# Patient Record
Sex: Male | Born: 1955 | Race: White | Hispanic: No | Smoking: Former smoker
Health system: Southern US, Community
[De-identification: ages and names within clinical notes are randomized; demographics above are authoritative.]

## PROBLEM LIST (undated history)

## (undated) DIAGNOSIS — I10 Essential (primary) hypertension: Secondary | ICD-10-CM

## (undated) DIAGNOSIS — C413 Malignant neoplasm of ribs, sternum and clavicle: Secondary | ICD-10-CM

## (undated) DIAGNOSIS — J449 Chronic obstructive pulmonary disease, unspecified: Secondary | ICD-10-CM

## (undated) DIAGNOSIS — N200 Calculus of kidney: Secondary | ICD-10-CM

## (undated) DIAGNOSIS — Z720 Tobacco use: Secondary | ICD-10-CM

## (undated) DIAGNOSIS — J45909 Unspecified asthma, uncomplicated: Secondary | ICD-10-CM

## (undated) HISTORY — DX: Essential (primary) hypertension: I10

## (undated) HISTORY — DX: Calculus of kidney: N20.0

## (undated) HISTORY — DX: Unspecified asthma, uncomplicated: J45.909

## (undated) HISTORY — DX: Tobacco use: Z72.0

## (undated) HISTORY — DX: Chronic obstructive pulmonary disease, unspecified: J44.9

## (undated) HISTORY — DX: Malignant neoplasm of ribs, sternum and clavicle: C41.3

---

## 2010-07-02 ENCOUNTER — Encounter: Payer: Self-pay | Admitting: Family Medicine

## 2010-07-02 DIAGNOSIS — J449 Chronic obstructive pulmonary disease, unspecified: Secondary | ICD-10-CM | POA: Insufficient documentation

## 2010-07-02 DIAGNOSIS — F17210 Nicotine dependence, cigarettes, uncomplicated: Secondary | ICD-10-CM | POA: Insufficient documentation

## 2010-07-02 DIAGNOSIS — N200 Calculus of kidney: Secondary | ICD-10-CM | POA: Insufficient documentation

## 2012-04-23 ENCOUNTER — Other Ambulatory Visit: Payer: Self-pay | Admitting: Physician Assistant

## 2012-06-04 ENCOUNTER — Telehealth: Payer: Self-pay | Admitting: Family Medicine

## 2012-06-05 MED ORDER — ALBUTEROL SULFATE HFA 108 (90 BASE) MCG/ACT IN AERS: 2.0000 | INHALATION_SPRAY | RESPIRATORY_TRACT | Status: DC | PRN

## 2012-06-05 NOTE — Telephone Encounter (Signed)
Rx Refilled  

## 2012-09-24 ENCOUNTER — Telehealth: Payer: Self-pay | Admitting: Family Medicine

## 2012-09-24 NOTE — Telephone Encounter (Signed)
Pt cannot afford symbicort. Can you change him to something else that may be cheaper?

## 2012-09-25 ENCOUNTER — Telehealth: Payer: Self-pay | Admitting: Family Medicine

## 2012-09-25 NOTE — Telephone Encounter (Signed)
pts daughter aware 

## 2012-09-25 NOTE — Telephone Encounter (Signed)
Pt has been getting samples of symbicort, but now needs it called into pharmacy. Daughter did not know what strength (I don't know if it comes in different strengths).

## 2012-09-25 NOTE — Telephone Encounter (Signed)
No cheaper alternative.  There is a rebate card on my desk that will help reduce the cost for commercially insured patients.

## 2012-09-26 MED ORDER — BUDESONIDE-FORMOTEROL FUMARATE 160-4.5 MCG/ACT IN AERO: 2.0000 | INHALATION_SPRAY | Freq: Two times a day (BID) | RESPIRATORY_TRACT | Status: DC

## 2012-09-26 NOTE — Telephone Encounter (Signed)
Per Dr. [name] ok to refill - med sent to pharm 

## 2012-09-28 ENCOUNTER — Encounter: Payer: Self-pay | Admitting: Family Medicine

## 2012-09-28 ENCOUNTER — Ambulatory Visit (HOSPITAL_COMMUNITY)
Admission: RE | Admit: 2012-09-28 | Discharge: 2012-09-28 | Disposition: A | Discharge status: Home / Self Care | Payer: BC Managed Care – PPO | Source: Ambulatory Visit | Attending: Family Medicine | Admitting: Family Medicine

## 2012-09-28 ENCOUNTER — Ambulatory Visit (INDEPENDENT_AMBULATORY_CARE_PROVIDER_SITE_OTHER): Payer: BC Managed Care – PPO | Admitting: Family Medicine

## 2012-09-28 VITALS — BP 146/84 | HR 80 | Temp 98.0°F | Resp 20 | Ht 70.0 in | Wt 161.0 lb

## 2012-09-28 DIAGNOSIS — J441 Chronic obstructive pulmonary disease with (acute) exacerbation: Secondary | ICD-10-CM

## 2012-09-28 DIAGNOSIS — R059 Cough, unspecified: Secondary | ICD-10-CM | POA: Insufficient documentation

## 2012-09-28 DIAGNOSIS — R05 Cough: Secondary | ICD-10-CM | POA: Insufficient documentation

## 2012-09-28 DIAGNOSIS — R091 Pleurisy: Secondary | ICD-10-CM | POA: Insufficient documentation

## 2012-09-28 MED ORDER — PREDNISONE 20 MG PO TABS: ORAL_TABLET | ORAL | Status: DC

## 2012-09-28 MED ORDER — HYDROCODONE-ACETAMINOPHEN 10-325 MG PO TABS: 0.5000 | ORAL_TABLET | Freq: Three times a day (TID) | ORAL | Status: DC | PRN

## 2012-09-28 MED ORDER — LEVOFLOXACIN 750 MG PO TABS: 750.0000 mg | ORAL_TABLET | Freq: Every day | ORAL | Status: DC

## 2012-09-28 NOTE — Progress Notes (Signed)
Subjective:    Patient ID: Randall Sawyer, male    DOB: 12-05-1955, 57 y.o.   MRN: [id number]  HPI  Patient is a pleasant 57 year old white male who comes in for right-sided pleurisy, increasing shortness of breath, worsening cough, and subjective fevers over the last few days. He has a history of COPD. He is currently taking Symbicort every day. Unfortunately he continues to smoke. Over the last few days he's been using his albuterol rescue inhaler every 6 hours. He continues to wheeze and cough. Over last 2 days he has developed severe right-sided pleurisy in his right lower ribs. It hurts to even touch the rib. It hurt to take a deep breath and cough.   He denies hemoptysis. He denies angina. He denies any history of DVTs or PEs. He denies any recent immobilizations/surgeries.   Past Medical History  Diagnosis Date  . Kidney stones   . Tobacco abuse   . COPD (chronic obstructive pulmonary disease)    No past surgical history on file. Current Outpatient Prescriptions on File Prior to Visit  Medication Sig Dispense Refill  . albuterol (VENTOLIN HFA) 108 (90 BASE) MCG/ACT inhaler Inhale 2 puffs into the lungs every 4 (four) hours as needed.  18 g  5  . budesonide-formoterol (SYMBICORT) 160-4.5 MCG/ACT inhaler Inhale 2 puffs into the lungs 2 (two) times daily.  1 Inhaler  3   No current facility-administered medications on file prior to visit.   No Known Allergies History   Social History  . Marital Status: Divorced    Spouse Name: N/A    Number of Children: N/A  . Years of Education: N/A   Occupational History  . Not on file.   Social History Main Topics  . Smoking status: Current Every Day Smoker    Types: Cigarettes  . Smokeless tobacco: Not on file  . Alcohol Use: No  . Drug Use: No  . Sexual Activity: Not on file   Other Topics Concern  . Not on file   Social History Narrative  . No narrative on file     Review of Systems  All other systems reviewed and are  negative.       Objective:   Physical Exam  Vitals reviewed. Constitutional: He appears well-developed and well-nourished.  Eyes: Conjunctivae are normal.  Neck: Neck supple. No JVD present. No thyromegaly present.  Cardiovascular: Normal rate, regular rhythm and normal heart sounds.   No murmur heard. Pulmonary/Chest: Effort normal. He has wheezes. He has rales. He exhibits tenderness.  Abdominal: Soft. Bowel sounds are normal.  Musculoskeletal: He exhibits no edema.  Lymphadenopathy:    He has no cervical adenopathy.   Patient has prolonged expiratory phase, decreased breath sounds throughout, diffuse severe scattered wheezing, and right lower lobe rhonchi Rales. He is tender to palpation on his right ribs below the costovertebral angle.       Assessment & Plan:  1. COPD exacerbation I believe this is a COPD exacerbation and may be developing into pneumonia. Therefore I started patient on prednisone taper and Levaquin 750 mg by mouth daily for 7 days. I want him to go immediately to the hospital and get a chest x-ray to rule out pneumothorax and to evaluate for pneumonia recheck next week if no better or go to the hospital if worse. - predniSONE (DELTASONE) 20 MG tablet; 3 tabs poqday 1-2, 2 tabs poqday 3-4, 1 tab poqday 5-6  Dispense: 12 tablet; Refill: 0 - levofloxacin (LEVAQUIN) 750 MG tablet; Take  1 tablet (750 mg total) by mouth daily.  Dispense: 7 tablet; Refill: 0 - DG Chest 2 View; Future  2. Pleurisy I believe the patient's pleurisy could be from a fractured rib due to coughing. I will check a chest x-ray. The chest x-ray is negative for pneumothorax, the patient can start the pain medication to help with the pain and I encouraged incentive spirometry.  Meanwhile I will treat the COPD exacerbation with prednisone and Levaquin and albuterol. - DG Chest 2 View; Future - HYDROcodone-acetaminophen (NORCO) 10-325 MG per tablet; Take 0.5 tablets by mouth every 8 (eight) hours  as needed for pain.  Dispense: 30 tablet; Refill: 0

## 2012-10-23 ENCOUNTER — Other Ambulatory Visit: Payer: Self-pay | Admitting: Family Medicine

## 2012-10-23 NOTE — Telephone Encounter (Signed)
Inhaler refilled

## 2012-11-15 ENCOUNTER — Other Ambulatory Visit: Payer: Self-pay

## 2013-01-16 ENCOUNTER — Other Ambulatory Visit: Payer: Self-pay | Admitting: Family Medicine

## 2013-05-10 ENCOUNTER — Encounter: Payer: Self-pay | Admitting: Family Medicine

## 2013-05-10 ENCOUNTER — Ambulatory Visit (INDEPENDENT_AMBULATORY_CARE_PROVIDER_SITE_OTHER): Payer: BC Managed Care – PPO | Admitting: Family Medicine

## 2013-05-10 VITALS — BP 140/76 | HR 92 | Temp 97.3°F | Resp 22 | Ht 70.0 in | Wt 166.0 lb

## 2013-05-10 DIAGNOSIS — H6120 Impacted cerumen, unspecified ear: Secondary | ICD-10-CM

## 2013-05-10 DIAGNOSIS — H918X9 Other specified hearing loss, unspecified ear: Secondary | ICD-10-CM

## 2013-05-10 NOTE — Progress Notes (Signed)
   Subjective:    Patient ID: Randall Sawyer, male    DOB: 1955/10/02, 58 y.o.   MRN: [id number]  HPI   patient presents today with bilateral hearing loss for approximately 1-1/2-2 weeks. He states that both ears feel as if they're "stopped up", like he is wearing hearing plugs.  He denies any otalgia, fevers, chills, upper respiratory symptoms, or tinnitus. Past Medical History  Diagnosis Date  . Kidney stones   . Tobacco abuse   . COPD (chronic obstructive pulmonary disease)    Current Outpatient Prescriptions on File Prior to Visit  Medication Sig Dispense Refill  . SYMBICORT 160-4.5 MCG/ACT inhaler INHALE 2 PUFFS BY MOUTH TWICE DAILY  10.2 g  5  . VENTOLIN HFA 108 (90 BASE) MCG/ACT inhaler INHALE 2 PUFFS BY MOUTH EVERY 4 HOURS AS NEEDED  18 g  11   No current facility-administered medications on file prior to visit.   No Known Allergies History   Social History  . Marital Status: Divorced    Spouse Name: N/A    Number of Children: N/A  . Years of Education: N/A   Occupational History  . Not on file.   Social History Main Topics  . Smoking status: Current Every Day Smoker    Types: Cigarettes  . Smokeless tobacco: Not on file  . Alcohol Use: No  . Drug Use: No  . Sexual Activity: Not on file   Other Topics Concern  . Not on file   Social History Narrative  . No narrative on file     Review of Systems  All other systems reviewed and are negative.      Objective:   Physical Exam  Vitals reviewed. HENT:  Right Ear: A foreign body is present. Decreased hearing is noted.  Left Ear: A foreign body is present. Decreased hearing is noted.  Cardiovascular: Normal rate, regular rhythm and normal heart sounds.   Pulmonary/Chest: Effort normal and breath sounds normal. No respiratory distress. He has no wheezes. He has no rales.   both ear canals are occluded with cerumen.        Assessment & Plan:  1. Hearing loss secondary to cerumen impaction The  cerumen impactions were removed with irrigation/lavage, and using alligator forceps. The patient tolerated procedure well without complication. His symptoms improved upon removal of the impactions.

## 2013-05-16 ENCOUNTER — Encounter: Payer: Self-pay | Admitting: Physician Assistant

## 2013-05-16 ENCOUNTER — Ambulatory Visit (INDEPENDENT_AMBULATORY_CARE_PROVIDER_SITE_OTHER): Payer: BC Managed Care – PPO | Admitting: Physician Assistant

## 2013-05-16 VITALS — BP 174/88 | HR 112 | Temp 98.4°F | Resp 22 | Wt 160.0 lb

## 2013-05-16 DIAGNOSIS — J441 Chronic obstructive pulmonary disease with (acute) exacerbation: Secondary | ICD-10-CM

## 2013-05-16 DIAGNOSIS — J449 Chronic obstructive pulmonary disease, unspecified: Secondary | ICD-10-CM

## 2013-05-16 DIAGNOSIS — F17200 Nicotine dependence, unspecified, uncomplicated: Secondary | ICD-10-CM

## 2013-05-16 DIAGNOSIS — Z720 Tobacco use: Secondary | ICD-10-CM

## 2013-05-16 MED ORDER — PREDNISONE 20 MG PO TABS: ORAL_TABLET | ORAL | Status: DC

## 2013-05-16 MED ORDER — LEVOFLOXACIN 750 MG PO TABS: 750.0000 mg | ORAL_TABLET | Freq: Every day | ORAL | Status: DC

## 2013-05-16 MED ORDER — TIOTROPIUM BROMIDE MONOHYDRATE 18 MCG IN CAPS: 18.0000 ug | ORAL_CAPSULE | Freq: Every day | RESPIRATORY_TRACT | Status: DC

## 2013-05-16 MED ORDER — METHYLPREDNISOLONE ACETATE 80 MG/ML IJ SUSP
80.0000 mg | Freq: Once | INTRAMUSCULAR | Status: AC
  Administered 2013-05-16: 80 mg via INTRAMUSCULAR

## 2013-05-16 NOTE — Progress Notes (Signed)
Patient ID: Marshaun Lortie MRN: [id number], DOB: Apr 26, 1955, 58 y.o. Date of Encounter: 05/16/2013, 11:59 AM    Chief Complaint:  Chief Complaint  Patient presents with  . hx COPD    diff breathing, cough, vomoiting, fever x 5 days     HPI: 58 y.o. year old white male is here with his daughter.   He reports that the symptoms started on Monday evening which was 05/13/13. He says that at time he developed some fever (which is  subjective. He says that he felt like if he was in a sweat but did not actually check his temperature with a thermometer). Also at that time developed increased amount of shortness of breath. He says the only vomiting that has occurred has been secondary to cough. No true GI type vomiting. Says says he is coughing up some phlegm which is yellow green. He has been using albuterol inhaler every 4-5 hours with no relief. Has been having no nasal or head congestion and no nasal mucus. No sore throat or earache.     Home Meds: See attached medication section for any medications that were entered at today's visit. The computer does not put those onto this list.The following list is a list of meds entered prior to today's visit.   Current Outpatient Prescriptions on File Prior to Visit  Medication Sig Dispense Refill  . SYMBICORT 160-4.5 MCG/ACT inhaler INHALE 2 PUFFS BY MOUTH TWICE DAILY  10.2 g  5  . VENTOLIN HFA 108 (90 BASE) MCG/ACT inhaler INHALE 2 PUFFS BY MOUTH EVERY 4 HOURS AS NEEDED  18 g  11   No current facility-administered medications on file prior to visit.    Allergies: No Known Allergies    Review of Systems: See HPI for pertinent ROS. All other ROS negative.    Physical Exam: Blood pressure 174/88, pulse 112, temperature 98.4 F (36.9 C), temperature source Oral, resp. rate 22, weight 160 lb (72.576 kg), SpO2 91.00%., Body mass index is 22.96 kg/(m^2). General: WNWD WM.  Appears in no acute distress. HEENT: Normocephalic, atraumatic, eyes  without discharge, sclera non-icteric, nares are without discharge. Bilateral auditory canals clear, TM's are without perforation, pearly grey and translucent with reflective cone of light bilaterally. Oral cavity moist, posterior pharynx without exudate, erythema, peritonsillar abscess.  Neck: Supple. No thyromegaly. No lymphadenopathy. Lungs: He has tight wheezing throughout bilaterally. Heart: Regular rhythm. No murmurs, rubs, or gallops. Msk:  Strength and tone normal for age. Extremities/Skin: Warm and dry. Neuro: Alert and oriented X 3. Moves all extremities spontaneously. Gait is normal. CNII-XII grossly in tact. Psych:  Responds to questions appropriately with a normal affect.     ASSESSMENT AND PLAN:  58 y.o. year old male with  1. COPD exacerbation - levofloxacin (LEVAQUIN) 750 MG tablet; Take 1 tablet (750 mg total) by mouth daily.  Dispense: 7 tablet; Refill: 0 - predniSONE (DELTASONE) 20 MG tablet; Take 2 daily for 5 days  Dispense: 10 tablet; Refill: 0 - tiotropium (SPIRIVA HANDIHALER) 18 MCG inhalation capsule; Place 1 capsule (18 mcg total) into inhaler and inhale daily.  Dispense: 30 capsule; Refill: 12 - methylPREDNISolone acetate (DEPO-MEDROL) injection 80 mg; Inject 1 mL (80 mg total) into the muscle once.  2. COPD (chronic obstructive pulmonary disease)  3. Tobacco abuse Reports the prior to this exacerbation he was smoking one half pack per day. I offered him a medication to help him with cessation but he defers. Daughter says that she has been trying to  get him to stop smoking for over 30 years and has been unsuccessful.   Reviewed his chart in Epic. Last office visit regarding any COPD exacerbation was with Dr. Dennard Schaumann 09/28/12. Patient and daughter state that he has not had any significant exacerbation requiring any medical evaluation since 09/2012. Reviewed that his current medications are Symbicort which she uses routinely and albuterol which he uses as  needed. Discussed adding Spiriva. He states that he actually had been on Spiriva prior to changing over to Symbicort. Says the Spiriva was too expensive says it was $128 per month even with his insurance in the past. However he says this was about 2 years ago. Told him I would send in a prescription for Spiriva now just to see what the cost would be. If the cost has decreased and this is now affordable he can add this to his Symbicort to help prevent future exacerbations.  Gave him Depo-Medrol 80 mg IM here in the office. He is to start the oral prednisone tomorrow. Start the antibiotic today and take that as directed for the full 7 days. If symptoms worsen follow up with [place] or go to the ER. If symptoms do not resolve and return back to baseline at completion of these medications, then followup.    Signed, [address] [place], [place], [facility] 05/16/2013 11:59 AM

## 2013-05-17 ENCOUNTER — Encounter: Payer: Self-pay | Admitting: Physician Assistant

## 2013-05-20 ENCOUNTER — Other Ambulatory Visit: Payer: Self-pay | Admitting: Family Medicine

## 2013-06-10 ENCOUNTER — Other Ambulatory Visit: Payer: Self-pay | Admitting: Family Medicine

## 2013-08-16 ENCOUNTER — Other Ambulatory Visit: Payer: Self-pay | Admitting: Physician Assistant

## 2013-08-18 ENCOUNTER — Encounter: Payer: Self-pay | Admitting: Family Medicine

## 2013-08-19 MED ORDER — ALBUTEROL SULFATE HFA 108 (90 BASE) MCG/ACT IN AERS: INHALATION_SPRAY | RESPIRATORY_TRACT | Status: DC

## 2013-08-19 NOTE — Telephone Encounter (Signed)
Refill appropriate and filled per protocol. 

## 2013-10-15 ENCOUNTER — Telehealth: Payer: Self-pay | Admitting: Family Medicine

## 2013-10-15 ENCOUNTER — Encounter: Payer: Self-pay | Admitting: Family Medicine

## 2013-10-15 NOTE — Telephone Encounter (Signed)
Spoke to pts daughter and she states that the Symbicort does not seem to work as well as when he first started it and the [place] is too expensive. I suggested that he make an appt to come in and discuss his alternatives since it has been awhile. She agreed and appt made.

## 2013-10-15 NOTE — Telephone Encounter (Signed)
[place] daughter is calling to talk to you about his symbicort, it is giving him the shakes

## 2013-10-18 ENCOUNTER — Ambulatory Visit: Payer: BC Managed Care – PPO | Admitting: Family Medicine

## 2013-10-18 MED ORDER — TIOTROPIUM BROMIDE-OLODATEROL 2.5-2.5 MCG/ACT IN AERS: 2.5000 mg | INHALATION_SPRAY | Freq: Every day | RESPIRATORY_TRACT | Status: DC

## 2013-10-28 ENCOUNTER — Telehealth: Payer: Self-pay | Admitting: *Deleted

## 2013-10-28 ENCOUNTER — Other Ambulatory Visit: Payer: Self-pay | Admitting: Family Medicine

## 2013-10-28 MED ORDER — TIOTROPIUM BROMIDE-OLODATEROL 2.5-2.5 MCG/ACT IN AERS: 5.0000 ug | INHALATION_SPRAY | Freq: Every day | RESPIRATORY_TRACT | Status: DC

## 2013-10-28 NOTE — Telephone Encounter (Signed)
Larene Beach from [employer] called wanting to know what the instructions were for pt oon the the stiolto she has he has to take 2.31mcg and wanted a confirmation, I looked up the medication and to see what the pt should be taking and I told her he should be doing two puffs daily and to stop teh spriva.

## 2013-11-01 ENCOUNTER — Other Ambulatory Visit: Payer: Self-pay | Admitting: Family Medicine

## 2013-11-04 NOTE — Telephone Encounter (Signed)
Refill appropriate and filled per protocol. 

## 2013-12-21 ENCOUNTER — Encounter: Payer: Self-pay | Admitting: Family Medicine

## 2013-12-25 ENCOUNTER — Encounter: Payer: Self-pay | Admitting: Family Medicine

## 2013-12-27 ENCOUNTER — Ambulatory Visit (INDEPENDENT_AMBULATORY_CARE_PROVIDER_SITE_OTHER): Payer: BC Managed Care – PPO | Admitting: Family Medicine

## 2013-12-27 ENCOUNTER — Encounter: Payer: Self-pay | Admitting: Family Medicine

## 2013-12-27 VITALS — BP 130/80 | HR 88 | Temp 98.2°F | Resp 18 | Ht 70.0 in | Wt 180.0 lb

## 2013-12-27 DIAGNOSIS — R0689 Other abnormalities of breathing: Secondary | ICD-10-CM

## 2013-12-27 DIAGNOSIS — I10 Essential (primary) hypertension: Secondary | ICD-10-CM

## 2013-12-27 DIAGNOSIS — R0600 Dyspnea, unspecified: Secondary | ICD-10-CM

## 2013-12-27 DIAGNOSIS — R609 Edema, unspecified: Secondary | ICD-10-CM

## 2013-12-27 LAB — COMPLETE METABOLIC PANEL WITH GFR
ALT: 9 U/L (ref 0–53)
AST: 11 U/L (ref 0–37)
Albumin: 3.7 g/dL (ref 3.5–5.2)
Alkaline Phosphatase: 80 U/L (ref 39–117)
BUN: 8 mg/dL (ref 6–23)
CALCIUM: 9.5 mg/dL (ref 8.4–10.5)
CHLORIDE: 94 meq/L — AB (ref 96–112)
CO2: 34 meq/L — AB (ref 19–32)
Creat: 0.78 mg/dL (ref 0.50–1.35)
GFR, Est Non African American: >89 mL/min
Glucose, Bld: 85 mg/dL (ref 70–99)
Potassium: 4.9 mEq/L (ref 3.5–5.3)
SODIUM: 134 meq/L — AB (ref 135–145)
TOTAL PROTEIN: 6.3 g/dL (ref 6.0–8.3)
Total Bilirubin: 0.4 mg/dL (ref 0.2–1.2)

## 2013-12-27 LAB — BRAIN NATRIURETIC PEPTIDE: Brain Natriuretic Peptide: 13.3 pg/mL (ref 0.0–100.0)

## 2013-12-27 MED ORDER — FUROSEMIDE 40 MG PO TABS: 40.0000 mg | ORAL_TABLET | Freq: Every day | ORAL | Status: DC

## 2013-12-27 MED ORDER — VALSARTAN 160 MG PO TABS: 160.0000 mg | ORAL_TABLET | Freq: Every day | ORAL | Status: DC

## 2013-12-27 NOTE — Progress Notes (Signed)
   Subjective:    Patient ID: Randall Sawyer, male    DOB: Aug 17, 1955, 58 y.o.   MRN: [id number]  HPI Patient is here today complaining of dyspnea on exertion and shortness of breath. He is accompanied by his daughter. This is been gradually worsening over the last several months.  As his acutely worsened. He denies any chest pain. He denies any angina. His EKG today shows normal sinus rhythm with no evidence of ischemia or infarction. His blood pressure has been elevated at home. It is borderline here today. He has +2 edema in his legs. I do not appreciate any ascites. I do not appreciate any crackles on pulmonary examination although he is wheezing. Unfortunately this gentleman continues to smoke. Past Medical History  Diagnosis Date  . Kidney stones   . Tobacco abuse   . COPD (chronic obstructive pulmonary disease)    No past surgical history on file. Current Outpatient Prescriptions on File Prior to Visit  Medication Sig Dispense Refill  . Tiotropium Bromide-Olodaterol (STIOLTO RESPIMAT) 2.5-2.5 MCG/ACT AERS Inhale 5 mcg into the lungs daily. 4 g 5  . VENTOLIN HFA 108 (90 BASE) MCG/ACT inhaler INHALE 2 PUFFS BY MOUTH EVERY 4 HOURS AS NEEDED 18 g 11   No current facility-administered medications on file prior to visit.   No Known Allergies History   Social History  . Marital Status: Divorced    Spouse Name: N/A    Number of Children: N/A  . Years of Education: N/A   Occupational History  . Not on file.   Social History Main Topics  . Smoking status: Current Some Day Smoker    Types: Cigarettes  . Smokeless tobacco: Never Used  . Alcohol Use: No  . Drug Use: No  . Sexual Activity: Not on file   Other Topics Concern  . Not on file   Social History Narrative      Review of Systems  All other systems reviewed and are negative.      Objective:   Physical Exam  Neck: Neck supple. No JVD present.  Cardiovascular: Normal rate and regular rhythm.   No murmur  heard. Pulmonary/Chest: Effort normal. No respiratory distress. He has wheezes. He has no rales.  Abdominal: Soft. Bowel sounds are normal. He exhibits no distension. There is no tenderness. There is no rebound.  Musculoskeletal: He exhibits edema.  Vitals reviewed.         Assessment & Plan:  Edema - Plan: furosemide (LASIX) 40 MG tablet, EKG 12-Lead  Essential hypertension - Plan: valsartan (DIOVAN) 160 MG tablet  Dyspnea and respiratory abnormality - Plan: COMPLETE METABOLIC PANEL WITH GFR, Brain natriuretic peptide, Ambulatory referral to Cardiology, EKG 12-Lead  I'm concerned the patient is developing congestive heart failure. I want him to begin Lasix 40 mg by mouth daily. I will check a CBC, CMP, and BNP. I also want him to start Diovan 160 mg by mouth daily for blood pressure as well as possible congestive heart failure. I will recheck the patient on Monday. I instructed him to go to the emergency room if worse. I will schedule him see a cardiologist as an outpatient for a stress test and echocardiogram. I strongly recommended the patient discontinue smoking.

## 2013-12-28 ENCOUNTER — Encounter: Payer: Self-pay | Admitting: Family Medicine

## 2013-12-30 ENCOUNTER — Telehealth: Payer: Self-pay | Admitting: Family Medicine

## 2013-12-30 ENCOUNTER — Encounter: Payer: Self-pay | Admitting: Family Medicine

## 2013-12-30 ENCOUNTER — Ambulatory Visit (INDEPENDENT_AMBULATORY_CARE_PROVIDER_SITE_OTHER): Payer: BC Managed Care – PPO | Admitting: Family Medicine

## 2013-12-30 VITALS — BP 142/70 | HR 100 | Temp 98.0°F | Resp 18 | Ht 70.0 in | Wt 176.0 lb

## 2013-12-30 DIAGNOSIS — R0689 Other abnormalities of breathing: Secondary | ICD-10-CM

## 2013-12-30 DIAGNOSIS — R0600 Dyspnea, unspecified: Secondary | ICD-10-CM

## 2013-12-30 MED ORDER — POTASSIUM CHLORIDE CRYS ER 20 MEQ PO TBCR: 20.0000 meq | EXTENDED_RELEASE_TABLET | Freq: Every day | ORAL | Status: DC

## 2013-12-30 NOTE — Telephone Encounter (Signed)
Calling to check the status of referral to [facility]  [phone]

## 2013-12-30 NOTE — Telephone Encounter (Signed)
Left message on vm to pt informing him of the status.

## 2013-12-30 NOTE — Telephone Encounter (Signed)
Spoke with Coralyn Mark at [facility] cardiology in [place], states she was not in office Friday and has not gotten around to him but will look into his referral and schedule appointment.

## 2013-12-30 NOTE — Progress Notes (Signed)
Subjective:    Patient ID: Randall Sawyer, male    DOB: [birth year], 58 y.o.   MRN: [id number]  HPI 12/27/13 Patient is here today complaining of dyspnea on exertion and shortness of breath. He is accompanied by his daughter. This is been gradually worsening over the last several months.  As his acutely worsened. He denies any chest pain. He denies any angina. His EKG today shows normal sinus rhythm with no evidence of ischemia or infarction. His blood pressure has been elevated at home. It is borderline here today. He has +2 edema in his legs. I do not appreciate any ascites. I do not appreciate any crackles on pulmonary examination although he is wheezing. Unfortunately this gentleman continues to smoke.  At that time, my plan was: I'm concerned the patient is developing congestive heart failure. I want him to begin Lasix 40 mg by mouth daily. I will check a CBC, CMP, and BNP. I also want him to start Diovan 160 mg by mouth daily for blood pressure as well as possible congestive heart failure. I will recheck the patient on Monday. I instructed him to go to the emergency room if worse. I will schedule him see a cardiologist as an outpatient for a stress test and echocardiogram. I strongly recommended the patient discontinue smoking.  12/30/13 Patient is here today for recheck.  Patient has lost 4 pounds since Friday. Wt Readings from Last 3 Encounters:  12/30/13 176 lb (79.833 kg)  12/27/13 180 lb (81.647 kg)  05/16/13 160 lb (72.576 kg)  He states his breathing is some better but he continues to have +1-2 edema in his legs.  I do not  Appreciate any crackles in his lungs.  He does have markedly diminished breath sounds and wheezing.  His BNP was normal suprisingly at 13.   Past Medical History  Diagnosis Date  . Kidney stones   . Tobacco abuse   . COPD (chronic obstructive pulmonary disease)    No past surgical history on file. Current Outpatient Prescriptions on File Prior to Visit    Medication Sig Dispense Refill  . furosemide (LASIX) 40 MG tablet Take 1 tablet (40 mg total) by mouth daily. 30 tablet 3  . Tiotropium Bromide-Olodaterol (STIOLTO RESPIMAT) 2.5-2.5 MCG/ACT AERS Inhale 5 mcg into the lungs daily. 4 g 5  . valsartan (DIOVAN) 160 MG tablet Take 1 tablet (160 mg total) by mouth daily. 30 tablet 3  . VENTOLIN HFA 108 (90 BASE) MCG/ACT inhaler INHALE 2 PUFFS BY MOUTH EVERY 4 HOURS AS NEEDED 18 g 11   No current facility-administered medications on file prior to visit.   No Known Allergies History   Social History  . Marital Status: Divorced    Spouse Name: N/A    Number of Children: N/A  . Years of Education: N/A   Occupational History  . Not on file.   Social History Main Topics  . Smoking status: Current Some Day Smoker    Types: Cigarettes  . Smokeless tobacco: Never Used  . Alcohol Use: No  . Drug Use: No  . Sexual Activity: Not on file   Other Topics Concern  . Not on file   Social History Narrative      Review of Systems  All other systems reviewed and are negative.      Objective:   Physical Exam  Neck: Neck supple. No JVD present.  Cardiovascular: Normal rate and regular rhythm.   No murmur heard. Pulmonary/Chest: Effort normal. No respiratory  distress. He has wheezes. He has no rales.  Abdominal: Soft. Bowel sounds are normal. He exhibits no distension. There is no tenderness. There is no rebound.  Musculoskeletal: He exhibits edema.  Vitals reviewed.         Assessment & Plan:  Dyspnea and respiratory abnormality  I will increase lasix to 80 mg poqday and add Kdur 20 meq poqday and recheck in 1 week.  However, it is beginning to appear more like right-sided failure from severe COPD.  If no better in 1 week, I will add prednisone.  Patient MUST quit smoking.  Continue stiolto.

## 2014-01-06 ENCOUNTER — Ambulatory Visit (INDEPENDENT_AMBULATORY_CARE_PROVIDER_SITE_OTHER): Payer: BC Managed Care – PPO | Admitting: Family Medicine

## 2014-01-06 ENCOUNTER — Encounter: Payer: Self-pay | Admitting: Family Medicine

## 2014-01-06 VITALS — BP 100/64 | HR 86 | Temp 97.9°F | Resp 20 | Ht 70.0 in | Wt 177.0 lb

## 2014-01-06 DIAGNOSIS — R0600 Dyspnea, unspecified: Secondary | ICD-10-CM

## 2014-01-06 DIAGNOSIS — R609 Edema, unspecified: Secondary | ICD-10-CM

## 2014-01-06 DIAGNOSIS — R0689 Other abnormalities of breathing: Secondary | ICD-10-CM

## 2014-01-06 MED ORDER — METOLAZONE 5 MG PO TABS: ORAL_TABLET | ORAL | Status: DC

## 2014-01-06 NOTE — Progress Notes (Signed)
Subjective:    Patient ID: Randall Sawyer, male    DOB: [birth year], 58 y.o.   MRN: [id number]  HPI 12/27/13 Patient is here today complaining of dyspnea on exertion and shortness of breath. He is accompanied by his daughter. This is been gradually worsening over the last several months.  As his acutely worsened. He denies any chest pain. He denies any angina. His EKG today shows normal sinus rhythm with no evidence of ischemia or infarction. His blood pressure has been elevated at home. It is borderline here today. He has +2 edema in his legs. I do not appreciate any ascites. I do not appreciate any crackles on pulmonary examination although he is wheezing. Unfortunately this gentleman continues to smoke.  At that time, my plan was: I'm concerned the patient is developing congestive heart failure. I want him to begin Lasix 40 mg by mouth daily. I will check a CBC, CMP, and BNP. I also want him to start Diovan 160 mg by mouth daily for blood pressure as well as possible congestive heart failure. I will recheck the patient on Monday. I instructed him to go to the emergency room if worse. I will schedule him see a cardiologist as an outpatient for a stress test and echocardiogram. I strongly recommended the patient discontinue smoking.  12/30/13 Patient is here today for recheck.  Patient has lost 4 pounds since Friday. Wt Readings from Last 3 Encounters:  12/30/13 176 lb (79.833 kg)  12/27/13 180 lb (81.647 kg)  05/16/13 160 lb (72.576 kg)  He states his breathing is some better but he continues to have +1-2 edema in his legs.  I do not  Appreciate any crackles in his lungs.  He does have markedly diminished breath sounds and wheezing.  His BNP was normal suprisingly at 13.    At that time, my plan was:   will increase lasix to 80 mg poqday and add Kdur 20 meq poqday and recheck in 1 week.  However, it is beginning to appear more like right-sided failure from severe COPD.  If no better in 1 week,  I will add prednisone.  Patient MUST quit smoking.  Continue stiolto.  01/06/14 Patient's breathing is no better. His weight is unchanged. In fact he has 1 pound heavier than he was one week ago. He has been taking Lasix 80 mg every day without benefit. He continues to complain of dyspnea on exertion which is mild. He continues to complain of orthopnea. He has +1 to +2 edema in both legs to the level of his knee. He denies any chest pain. Past Medical History  Diagnosis Date  . Kidney stones   . Tobacco abuse   . COPD (chronic obstructive pulmonary disease)    No past surgical history on file. Current Outpatient Prescriptions on File Prior to Visit  Medication Sig Dispense Refill  . furosemide (LASIX) 40 MG tablet Take 1 tablet (40 mg total) by mouth daily. 30 tablet 3  . potassium chloride SA (K-DUR,KLOR-CON) 20 MEQ tablet Take 1 tablet (20 mEq total) by mouth daily. 30 tablet 3  . Tiotropium Bromide-Olodaterol (STIOLTO RESPIMAT) 2.5-2.5 MCG/ACT AERS Inhale 5 mcg into the lungs daily. 4 g 5  . valsartan (DIOVAN) 160 MG tablet Take 1 tablet (160 mg total) by mouth daily. 30 tablet 3  . VENTOLIN HFA 108 (90 BASE) MCG/ACT inhaler INHALE 2 PUFFS BY MOUTH EVERY 4 HOURS AS NEEDED 18 g 11   No current facility-administered medications on file prior  to visit.   No Known Allergies History   Social History  . Marital Status: Divorced    Spouse Name: N/A    Number of Children: N/A  . Years of Education: N/A   Occupational History  . Not on file.   Social History Main Topics  . Smoking status: Current Some Day Smoker    Types: Cigarettes  . Smokeless tobacco: Never Used  . Alcohol Use: No  . Drug Use: No  . Sexual Activity: Not on file   Other Topics Concern  . Not on file   Social History Narrative      Review of Systems  All other systems reviewed and are negative.      Objective:   Physical Exam  Neck: Neck supple. No JVD present.  Cardiovascular: Normal rate and  regular rhythm.   No murmur heard. Pulmonary/Chest: Effort normal. No respiratory distress. He has wheezes. He has no rales.  Abdominal: Soft. Bowel sounds are normal. He exhibits no distension. There is no tenderness. There is no rebound.  Musculoskeletal: He exhibits edema.  Vitals reviewed.         Assessment & Plan:  Edema - Plan: metolazone (ZAROXOLYN) 5 MG tablet, COMPLETE METABOLIC PANEL WITH GFR  Dyspnea and respiratory abnormality  I will add Zaroxolyn 5 mg 30 minutes prior to Lasix here he can do this Monday Wednesday Friday. Recheck in one week. 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 I strongly recommended smoking cessation. He is scheduled to see the cardiologist January 12. If the patient is not experiencing any effective diuresis, the next would be to obtain an ultrasound of his liver to rule out cirrhosis or portal venous hypertension.

## 2014-01-07 LAB — COMPLETE METABOLIC PANEL WITH GFR
ALBUMIN: 3.8 g/dL (ref 3.5–5.2)
ALK PHOS: 87 U/L (ref 39–117)
ALT: 11 U/L (ref 0–53)
AST: 13 U/L (ref 0–37)
BUN: 15 mg/dL (ref 6–23)
CO2: 34 mEq/L — ABNORMAL HIGH (ref 19–32)
Calcium: 9.5 mg/dL (ref 8.4–10.5)
Chloride: 93 mEq/L — ABNORMAL LOW (ref 96–112)
Creat: 1.07 mg/dL (ref 0.50–1.35)
GFR, Est African American: 87 mL/min
GFR, Est Non African American: 75 mL/min
GLUCOSE: 95 mg/dL (ref 70–99)
Potassium: 5.1 mEq/L (ref 3.5–5.3)
Sodium: 135 mEq/L (ref 135–145)
TOTAL PROTEIN: 6.6 g/dL (ref 6.0–8.3)
Total Bilirubin: 0.3 mg/dL (ref 0.2–1.2)

## 2014-01-13 ENCOUNTER — Encounter: Payer: Self-pay | Admitting: Family Medicine

## 2014-01-21 ENCOUNTER — Encounter: Payer: Self-pay | Admitting: Cardiology

## 2014-01-21 ENCOUNTER — Ambulatory Visit (INDEPENDENT_AMBULATORY_CARE_PROVIDER_SITE_OTHER): Payer: BC Managed Care – PPO | Admitting: Cardiology

## 2014-01-21 VITALS — BP 110/72 | HR 107 | Ht 70.0 in | Wt 177.0 lb

## 2014-01-21 DIAGNOSIS — R0602 Shortness of breath: Secondary | ICD-10-CM

## 2014-01-21 DIAGNOSIS — J449 Chronic obstructive pulmonary disease, unspecified: Secondary | ICD-10-CM

## 2014-01-21 DIAGNOSIS — R0600 Dyspnea, unspecified: Secondary | ICD-10-CM | POA: Insufficient documentation

## 2014-01-21 MED ORDER — FUROSEMIDE 40 MG PO TABS: 60.0000 mg | ORAL_TABLET | Freq: Every day | ORAL | Status: DC

## 2014-01-21 NOTE — Assessment & Plan Note (Signed)
Associated with weight gain and leg edema with increased abdominal girth. With reported history of COPD and ongoing tobacco abuse, certainly could be cor pulmonale and possible component of pulmonary hypertension. He has had some improvement with diuretic treatment. Plan is to increase Lasix to 60 mg daily for now, proceed with complete echocardiogram to assess cardiac structure and function. Will bring him back to the office in the next few weeks with BMET.

## 2014-01-21 NOTE — Patient Instructions (Signed)
Your physician recommends that you schedule a follow-up appointment in: 2 weeks with Dr.McDowell Please have lab work done [employer] [facility] BEFORE NEXT VISIT    INCREASE Lasix to 60 mg daily (take 1 1/2 tablets of 40 mg pill) daily      Your physician has requested that you have an echocardiogram. Echocardiography is a painless test that uses sound waves to create images of your heart. It provides your doctor with information about the size and shape of your heart and how well your heart's chambers and valves are working. This procedure takes approximately one hour. There are no restrictions for this procedure.      Thank you for choosing [facility] !

## 2014-01-21 NOTE — Assessment & Plan Note (Signed)
Ongoing tobacco abuse. Severity unclear. Would recommend that he have full PFTs, defer to Dr. Dennard Schaumann.

## 2014-01-21 NOTE — Progress Notes (Signed)
Reason for visit: Shortness of breath, leg edema   Clinical Summary Mr. Coone is a 59 y.o.male referred for cardiology consultation by Dr. Dennard Schaumann. He states that over the last few months he has been experiencing weight gain, worsening shortness of breath, and also progressive leg edema as well as increased abdominal girth. He reports a history of COPD, severity is not clear. He has no known history of cardiomyopathy or ischemic heart disease. He reports NYHA class III dyspnea on exertion. Intermittent cough. Also orthopnea. He has not been experiencing any chest pain.  He was placed on diuretic therapy and Diovan by Dr. Dennard Schaumann. He does endorse some improvement in leg edema, although not resolution, still has somewhat protuberant abdomen.  Recent lab work in December 2015 showed potassium 5.1, BUN 15, creatinine 1.0, AST 13, ALT 11, BNP 13. ECG in December 2015 showed normal sinus rhythm.   No Known Allergies  Current Outpatient Prescriptions  Medication Sig Dispense Refill  . furosemide (LASIX) 40 MG tablet Take 1.5 tablets (60 mg total) by mouth daily. 135 tablet 3  . metolazone (ZAROXOLYN) 5 MG tablet Take this pill 30 minutes before furosemide.  You can do this Monday Wed, Friday as needed.. 30 tablet 0  . Tiotropium Bromide-Olodaterol (STIOLTO RESPIMAT) 2.5-2.5 MCG/ACT AERS Inhale 5 mcg into the lungs daily. 4 g 5  . valsartan (DIOVAN) 160 MG tablet Take 1 tablet (160 mg total) by mouth daily. 30 tablet 3  . VENTOLIN HFA 108 (90 BASE) MCG/ACT inhaler INHALE 2 PUFFS BY MOUTH EVERY 4 HOURS AS NEEDED 18 g 11   No current facility-administered medications for this visit.    Past Medical History  Diagnosis Date  . Nephrolithiasis   . Tobacco abuse   . COPD (chronic obstructive pulmonary disease)     History reviewed. No pertinent past surgical history.  Family History  Problem Relation Age of Onset  . COPD Father   . Diabetes type II Mother     Social History Mr. Juneau  reports that he has been smoking Cigarettes.  He has been smoking about 0.00 packs per day. He has never used smokeless tobacco. Mr. Loughner reports that he does not drink alcohol.  Review of Systems Complete review of systems negative except as otherwise outlined in the clinical summary and also the following. No palpitations or syncope. Appetite has been fair. No claudication. No fevers or chills.  Physical Examination Filed Vitals:   01/21/14 1331  BP: 110/72  Pulse: 107   Filed Weights   01/21/14 1331  Weight: 177 lb (80.287 kg)   Chronically ill-appearing male, no distress. HEENT: Conjunctiva and lids normal, oropharynx clear. Neck: Supple, elevated JVP, no carotid bruits, no thyromegaly. Lungs: Decreased breath sounds without rales., nonlabored breathing at rest. Cardiac: Regular rate and rhythm, cannot exclude S3, no significant systolic murmur, no pericardial rub. Abdomen: Protuberant, bowel sounds present, no guarding or rebound. Extremities: Firm 2+ edema, distal pulses 2+. Skin: Warm and dry. Musculoskeletal: No kyphosis. Neuropsychiatric: Alert and oriented x3, affect grossly appropriate.   Problem List and Plan   Shortness of breath Associated with weight gain and leg edema with increased abdominal girth. With reported history of COPD and ongoing tobacco abuse, certainly could be cor pulmonale and possible component of pulmonary hypertension. He has had some improvement with diuretic treatment. Plan is to increase Lasix to 60 mg daily for now, proceed with complete echocardiogram to assess cardiac structure and function. Will bring him back to the office in  the next few weeks with BMET.  COPD (chronic obstructive pulmonary disease) Ongoing tobacco abuse. Severity unclear. Would recommend that he have full PFTs, defer to Dr. Dennard Schaumann.    Satira Sark, M.D., F.A.C.C.

## 2014-01-24 ENCOUNTER — Ambulatory Visit (HOSPITAL_COMMUNITY)
Admission: RE | Admit: 2014-01-24 | Discharge: 2014-01-24 | Disposition: A | Discharge status: Home / Self Care | Payer: BC Managed Care – PPO | Source: Ambulatory Visit | Attending: Cardiology | Admitting: Cardiology

## 2014-01-24 DIAGNOSIS — Z720 Tobacco use: Secondary | ICD-10-CM | POA: Diagnosis not present

## 2014-01-24 DIAGNOSIS — R609 Edema, unspecified: Secondary | ICD-10-CM | POA: Insufficient documentation

## 2014-01-24 DIAGNOSIS — R0602 Shortness of breath: Secondary | ICD-10-CM | POA: Insufficient documentation

## 2014-01-24 DIAGNOSIS — I519 Heart disease, unspecified: Secondary | ICD-10-CM

## 2014-01-24 DIAGNOSIS — I429 Cardiomyopathy, unspecified: Secondary | ICD-10-CM | POA: Insufficient documentation

## 2014-01-24 MED ORDER — PERFLUTREN LIPID MICROSPHERE
1.0000 mL | INTRAVENOUS | Status: AC | PRN
  Administered 2014-01-24: 3 mL via INTRAVENOUS
  Filled 2014-01-24: qty 10

## 2014-01-24 MED ORDER — SODIUM CHLORIDE 0.9 % IJ SOLN
10.0000 mL | Freq: Once | INTRAMUSCULAR | Status: AC
  Administered 2014-01-24: 10 mL via INTRAVENOUS

## 2014-01-24 NOTE — Progress Notes (Signed)
  Echocardiogram 2D Echocardiogram with Definity has been performed.  [place], [place] 01/24/2014, 11:49 AM

## 2014-01-28 ENCOUNTER — Encounter: Payer: Self-pay | Admitting: Family Medicine

## 2014-01-30 ENCOUNTER — Encounter: Payer: Self-pay | Admitting: Family Medicine

## 2014-01-30 DIAGNOSIS — R1084 Generalized abdominal pain: Secondary | ICD-10-CM

## 2014-01-30 NOTE — Telephone Encounter (Signed)
Per [facility] will get ABD [place] - pt aware via mychart and ordered placed.

## 2014-02-03 ENCOUNTER — Ambulatory Visit (HOSPITAL_COMMUNITY)
Admission: RE | Admit: 2014-02-03 | Discharge: 2014-02-03 | Disposition: A | Discharge status: Home / Self Care | Payer: BLUE CROSS/BLUE SHIELD | Source: Ambulatory Visit | Attending: Family Medicine | Admitting: Family Medicine

## 2014-02-03 DIAGNOSIS — R1084 Generalized abdominal pain: Secondary | ICD-10-CM

## 2014-02-03 DIAGNOSIS — R109 Unspecified abdominal pain: Secondary | ICD-10-CM | POA: Diagnosis not present

## 2014-02-11 LAB — BASIC METABOLIC PANEL
BUN: 23 mg/dL (ref 6–23)
CHLORIDE: 94 meq/L — AB (ref 96–112)
CO2: 35 mEq/L — ABNORMAL HIGH (ref 19–32)
CREATININE: 1.39 mg/dL — AB (ref 0.50–1.35)
Calcium: 9 mg/dL (ref 8.4–10.5)
Glucose, Bld: 102 mg/dL — ABNORMAL HIGH (ref 70–99)
Potassium: 4.7 mEq/L (ref 3.5–5.3)
Sodium: 135 mEq/L (ref 135–145)

## 2014-02-14 ENCOUNTER — Ambulatory Visit (INDEPENDENT_AMBULATORY_CARE_PROVIDER_SITE_OTHER): Payer: BLUE CROSS/BLUE SHIELD | Admitting: Cardiology

## 2014-02-14 ENCOUNTER — Encounter: Payer: Self-pay | Admitting: Cardiology

## 2014-02-14 VITALS — BP 132/80 | HR 102 | Ht 70.0 in | Wt 175.0 lb

## 2014-02-14 DIAGNOSIS — R0602 Shortness of breath: Secondary | ICD-10-CM

## 2014-02-14 DIAGNOSIS — J449 Chronic obstructive pulmonary disease, unspecified: Secondary | ICD-10-CM

## 2014-02-14 DIAGNOSIS — R600 Localized edema: Secondary | ICD-10-CM

## 2014-02-14 NOTE — Progress Notes (Signed)
Cardiology Office Note  Date: 02/14/2014   ID: Lewi Drost, DOB [birth year], MRN [id number]  PCP: Odette Fraction, MD  Primary Cardiologist: Rozann Lesches, MD   Chief Complaint  Patient presents with  . Follow-up leg edema  . Shortness of Breath    History of Present Illness: Randall Sawyer is a 59 y.o. male seen recently in consultation in January 2016. At that time Lasix was increased to 60 mg daily and a follow-up echocardiogram was arranged, results outlined below. He is here today for a follow-up visit. Reports slow improvement in leg edema, not resolved yet. Still feeling of abdominal protuberance. Otherwise reports no chest pain or palpitations, chronic shortness of breath as before, no change in bowel habits.  We have discussed smoking cessation, he states that he is trying to cut back, but does not think that he will ever be able to quit quite frankly.  He did have an abdominal ultrasound ordered by Dr. Dennard Schaumann, results outlined below without acute findings other than fatty infiltration of the liver.  I reviewed his echocardiogram results which are detailed below. He has diastolic dysfunction which is probably contributing, low normal RV function, unable to assess PASP. Goal at this time will be continuing diuretics as needed, use of lower extremity compression stockings, also discussed limiting sodium intake to less than 2 g a day.   Past Medical History  Diagnosis Date  . Nephrolithiasis   . Tobacco abuse   . COPD (chronic obstructive pulmonary disease)     Current Outpatient Prescriptions  Medication Sig Dispense Refill  . furosemide (LASIX) 40 MG tablet Take 1.5 tablets (60 mg total) by mouth daily. 135 tablet 3  . metolazone (ZAROXOLYN) 5 MG tablet Take this pill 30 minutes before furosemide.  You can do this Monday Wed, Friday as needed.. 30 tablet 0  . Tiotropium Bromide-Olodaterol (STIOLTO RESPIMAT) 2.5-2.5 MCG/ACT AERS Inhale 5 mcg into the lungs  daily. 4 g 5  . valsartan (DIOVAN) 160 MG tablet Take 1 tablet (160 mg total) by mouth daily. 30 tablet 3  . VENTOLIN HFA 108 (90 BASE) MCG/ACT inhaler INHALE 2 PUFFS BY MOUTH EVERY 4 HOURS AS NEEDED 18 g 11   No current facility-administered medications for this visit.    Allergies:  Review of patient's allergies indicates no known allergies.   Social History: The patient  reports that he has been smoking Cigarettes.  He started smoking about 49 years ago. He has been smoking about 1.25 packs per day. He has never used smokeless tobacco. He reports that he does not drink alcohol or use illicit drugs.    ROS:  Please see the history of present illness. Otherwise, complete review of systems is positive for none.  All other systems are reviewed and negative.    Physical Exam: VS:  BP 132/80 mmHg  Pulse 102  Ht 5\' 10"  (1.778 m)  Wt 175 lb (79.379 kg)  BMI 25.11 kg/m2  SpO2 91%, BMI Body mass index is 25.11 kg/(m^2).  Wt Readings from Last 3 Encounters:  02/14/14 175 lb (79.379 kg)  01/21/14 177 lb (80.287 kg)  01/06/14 177 lb (80.287 kg)     Chronically ill-appearing male, no distress. HEENT: Conjunctiva and lids normal, oropharynx clear. Neck: Supple, elevated JVP, no carotid bruits, no thyromegaly. Lungs: Decreased breath sounds without rales., nonlabored breathing at rest. Cardiac: Regular rate and rhythm, cannot exclude S3, no significant systolic murmur, no pericardial rub. Abdomen: Protuberant, bowel sounds present, no guarding or rebound. Extremities:  1-2+ edema, distal pulses 2+. Skin: Warm and dry. Musculoskeletal: No kyphosis. Neuropsychiatric: Alert and oriented x3, affect grossly appropriate.   ECG: ECG is not ordered today.  Recent Labwork: 01/06/2014: ALT 11; AST 13 02/10/2014: BUN 23; Creatinine 1.39*; Potassium 4.7; Sodium 135   Other Studies Reviewed Today:  1. Echocardiogram from 01/24/2014 showed normal LV wall thickness with LVEF 55-60%, grade 1  diastolic dysfunction, mildly thickened mitral leaflets, mildly sclerotic aortic valve, low normal RV contraction, trivial tricuspid regurgitation, unable to assess PASP.  2. ULTRASOUND ABDOMEN COMPLETE 02/04/2014  COMPARISON: None.  FINDINGS: Gallbladder: No gallstones or wall thickening visualized. No sonographic Murphy sign noted.  Common bile duct: Diameter: 4.5 mm  Liver: Liver is echogenic consistent fatty infiltration.  IVC: No abnormality visualized.  Pancreas: Visualized portion unremarkable.  Spleen: Size and appearance within normal limits.  Right Kidney: Length: 11.7 cm. Echogenicity within normal limits. No mass or hydronephrosis visualized.  Left Kidney: Length: Limb 0.1 cm. Echogenicity within normal limits. No mass or hydronephrosis visualized.  Abdominal aorta: No aneurysm visualized.  Other findings: None.  IMPRESSION: Echogenic liver suggesting fatty infiltration. Exam otherwise unremarkable.  ASSESSMENT AND PLAN:  Bilateral leg edema Likely has some degree of venous insufficiency, relative volume overload with diastolic heart failure and increased filling pressures. He does have COPD which is probably also contributing, although could not document pulmonary pressures by echocardiogram, and RV function low normal. Recommend continuing diuretics, given a prescription for lower extremity compression stockings. Also recommended smoking cessation, limiting sodium to less than 2 g a day.   COPD (chronic obstructive pulmonary disease) Chronic shortness of breath, active tobacco abuse. We discussed smoking cessation strategies, but he is not certain that he will ever be able to quit. He has been trying to cut back. Keep follow-up with Dr. Dennard Schaumann.     Current medicines are reviewed at length with the patient today.  The patient does not have concerns regarding medicines.    Orders Placed This Encounter  Procedures  . Basic Metabolic Panel  (BMET)    Disposition: FU with me in 3 months.   Signed, Satira Sark, MD, [facility] 02/14/2014 10:47 AM    [facility]. [address] [place], [place] [address] Phone: [phone]; Fax: [phone]

## 2014-02-14 NOTE — Assessment & Plan Note (Signed)
Chronic shortness of breath, active tobacco abuse. We discussed smoking cessation strategies, but he is not certain that he will ever be able to quit. He has been trying to cut back. Keep follow-up with Dr. Dennard Schaumann.

## 2014-02-14 NOTE — Patient Instructions (Addendum)
Your physician wants you to follow-up in: 3 months with Dr Ferne Reus will receive a reminder letter in the mail two months in advance. If you don't receive a letter, please call our office to schedule the follow-up appointment.    Your physician recommends that you continue on your current medications as directed. Please refer to the Current Medication list given to you today.   We have given you a prescription for compression stockings.[facility] can assist you.    PLEASE GET LAB WORK IN 1 MONTH (BMET)     Thank you for choosing [facility] !

## 2014-02-14 NOTE — Assessment & Plan Note (Signed)
Likely has some degree of venous insufficiency, relative volume overload with diastolic heart failure and increased filling pressures. He does have COPD which is probably also contributing, although could not document pulmonary pressures by echocardiogram, and RV function low normal. Recommend continuing diuretics, given a prescription for lower extremity compression stockings. Also recommended smoking cessation, limiting sodium to less than 2 g a day.

## 2014-02-24 ENCOUNTER — Encounter: Payer: Self-pay | Admitting: Family Medicine

## 2014-02-25 ENCOUNTER — Telehealth: Payer: Self-pay | Admitting: Family Medicine

## 2014-02-25 NOTE — Telephone Encounter (Signed)
LMTRC

## 2014-02-25 NOTE — Telephone Encounter (Signed)
Patients daughter calling regarding her dads legs being numb and swollen more than usual  [phone]

## 2014-02-25 NOTE — Telephone Encounter (Signed)
Recommended appt and appt made

## 2014-02-27 ENCOUNTER — Encounter: Payer: Self-pay | Admitting: Family Medicine

## 2014-02-28 ENCOUNTER — Ambulatory Visit: Payer: Self-pay | Admitting: Family Medicine

## 2014-03-07 ENCOUNTER — Encounter: Payer: Self-pay | Admitting: Family Medicine

## 2014-03-07 ENCOUNTER — Ambulatory Visit (INDEPENDENT_AMBULATORY_CARE_PROVIDER_SITE_OTHER): Payer: BC Managed Care – PPO | Admitting: Family Medicine

## 2014-03-07 VITALS — BP 120/72 | HR 86 | Temp 97.8°F | Resp 18 | Ht 70.0 in | Wt 180.0 lb

## 2014-03-07 DIAGNOSIS — R609 Edema, unspecified: Secondary | ICD-10-CM

## 2014-03-07 LAB — CBC WITH DIFFERENTIAL/PLATELET
BASOS ABS: 0.1 10*3/uL (ref 0.0–0.1)
BASOS PCT: 1 % (ref 0–1)
Eosinophils Absolute: 0.3 10*3/uL (ref 0.0–0.7)
Eosinophils Relative: 5 % (ref 0–5)
HEMATOCRIT: 45 % (ref 39.0–52.0)
Hemoglobin: 15.2 g/dL (ref 13.0–17.0)
LYMPHS PCT: 22 % (ref 12–46)
Lymphs Abs: 1.4 10*3/uL (ref 0.7–4.0)
MCH: 33.5 pg (ref 26.0–34.0)
MCHC: 33.8 g/dL (ref 30.0–36.0)
MCV: 99.1 fL (ref 78.0–100.0)
MONOS PCT: 9 % (ref 3–12)
MPV: 10.5 fL (ref 8.6–12.4)
Monocytes Absolute: 0.6 10*3/uL (ref 0.1–1.0)
NEUTROS ABS: 4 10*3/uL (ref 1.7–7.7)
NEUTROS PCT: 63 % (ref 43–77)
Platelets: 258 10*3/uL (ref 150–400)
RBC: 4.54 MIL/uL (ref 4.22–5.81)
RDW: 15.4 % (ref 11.5–15.5)
WBC: 6.3 10*3/uL (ref 4.0–10.5)

## 2014-03-07 LAB — COMPLETE METABOLIC PANEL WITH GFR
ALT: 12 U/L (ref 0–53)
AST: 14 U/L (ref 0–37)
Albumin: 3.8 g/dL (ref 3.5–5.2)
Alkaline Phosphatase: 93 U/L (ref 39–117)
BUN: 12 mg/dL (ref 6–23)
CALCIUM: 9 mg/dL (ref 8.4–10.5)
CHLORIDE: 93 meq/L — AB (ref 96–112)
CO2: 34 meq/L — AB (ref 19–32)
CREATININE: 1.08 mg/dL (ref 0.50–1.35)
GFR, EST NON AFRICAN AMERICAN: 74 mL/min
GFR, Est African American: 86 mL/min
GLUCOSE: 89 mg/dL (ref 70–99)
Potassium: 4.2 mEq/L (ref 3.5–5.3)
Sodium: 138 mEq/L (ref 135–145)
Total Bilirubin: 0.4 mg/dL (ref 0.2–1.2)
Total Protein: 6.6 g/dL (ref 6.0–8.3)

## 2014-03-07 NOTE — Progress Notes (Signed)
   Subjective:    Patient ID: Randall Sawyer, male    DOB: 10/25/1955, 59 y.o.   MRN: [id number]  HPI  Please see my last office visits. Patient has been complaining of swelling in his legs and his abdomen. He does have +1 edema in his legs today. He has been taking Lasix 60 mg once daily which does seem to help in the morning but the swelling returned by the end of the day. Echocardiogram of the heart revealed grade 1 diastolic dysfunction but no overt heart failure. They were unable to assess for pulmonary hypertension. Ultrasound of the abdomen revealed no ascites. Reveal no cirrhosis. It also revealed no abdominal masses or blockages area therefore at the present time the patient swelling is likely due to venous insufficiency, diastolic dysfunction, and likely some element of right-sided heart failure due to COPD and even possibly sleep apnea for which the patient has not been screened. Unfortunately he is not responding to Lasix or metolazone due to acute renal insufficiency Past Medical History  Diagnosis Date  . Nephrolithiasis   . Tobacco abuse   . COPD (chronic obstructive pulmonary disease)    No past surgical history on file. Current Outpatient Prescriptions on File Prior to Visit  Medication Sig Dispense Refill  . furosemide (LASIX) 40 MG tablet Take 1.5 tablets (60 mg total) by mouth daily. 135 tablet 3  . VENTOLIN HFA 108 (90 BASE) MCG/ACT inhaler INHALE 2 PUFFS BY MOUTH EVERY 4 HOURS AS NEEDED 18 g 11   No current facility-administered medications on file prior to visit.   No Known Allergies History   Social History  . Marital Status: Divorced    Spouse Name: N/A  . Number of Children: N/A  . Years of Education: N/A   Occupational History  . Not on file.   Social History Main Topics  . Smoking status: Current Some Day Smoker -- 1.25 packs/day    Types: Cigarettes    Start date: 12/30/1964  . Smokeless tobacco: Never Used  . Alcohol Use: No  . Drug Use: No  .  Sexual Activity:    Partners: Female   Other Topics Concern  . Not on file   Social History Narrative     Review of Systems  All other systems reviewed and are negative.      Objective:   Physical Exam  Neck: No JVD present.  Cardiovascular: Normal rate, regular rhythm and normal heart sounds.   Pulmonary/Chest: Effort normal. He has wheezes.  Abdominal: Soft. Bowel sounds are normal. He exhibits no distension. There is no tenderness. There is no rebound.  Musculoskeletal: He exhibits edema.  Vitals reviewed.         Assessment & Plan:  Edema - Plan: COMPLETE METABOLIC PANEL WITH GFR, CBC with Differential/Platelet  I believe this is likely peripheral edema due to venous insufficiency coupled with diastolic dysfunction, there is likely some mild right-sided heart failure due to underlying COPD. The patient may even have an element of sleep apnea. I would like the patient to try Lasix 60 mg a day and add compression stockings which are knee-high 20-30 mmHg gradient. I recommended smoking cessation. I will recheck renal function to make sure the patient is not worsening. If the swelling continues consider a sleep apnea study to rule out other causes of right-sided heart failure.

## 2014-03-17 ENCOUNTER — Encounter: Payer: Self-pay | Admitting: Family Medicine

## 2014-04-17 ENCOUNTER — Telehealth: Payer: Self-pay | Admitting: Family Medicine

## 2014-04-17 DIAGNOSIS — I50810 Right heart failure, unspecified: Secondary | ICD-10-CM

## 2014-04-17 NOTE — Telephone Encounter (Signed)
Patient's daughter is calling to speak to you regarding her dad, she says that he is still swelling and fluid is not going down, would like to know what the next step would be to help with his swelling  [phone]

## 2014-04-18 NOTE — Telephone Encounter (Signed)
Pt daughter Hilda Blades has called again this morning because she has not received a call back from yesterday about her dad [phone]

## 2014-04-18 NOTE — Telephone Encounter (Signed)
Awaiting answer from Dr. Dennard Schaumann

## 2014-04-22 NOTE — Telephone Encounter (Signed)
If the patient swelling is persisting, I would recommend a sleep apnea study to see if he has sleep apnea causing right-sided heart failure

## 2014-04-23 NOTE — Telephone Encounter (Signed)
Order place for sleep study and pt's daughter aware

## 2014-05-05 ENCOUNTER — Telehealth: Payer: Self-pay | Admitting: *Deleted

## 2014-05-05 ENCOUNTER — Encounter: Payer: Self-pay | Admitting: Family Medicine

## 2014-05-05 NOTE — Telephone Encounter (Signed)
Yes he needs to be seen if the swelling is that bad.

## 2014-05-05 NOTE — Telephone Encounter (Signed)
Pt daughter is aware needs to be seen but pt does not want to be seen until Friday(is the only time he can come), however he is scheduled but wants to know if you would rather have him to start back on his lasix and refill on nebulizer solution?

## 2014-05-05 NOTE — Telephone Encounter (Signed)
Pt daughter came into office stating that pt has some swelling in ankles to where can not tell he has ankles and his breathing is off. Pt was told to stop taking the Lasix '40mg'$  and wants to know if he can start back on these and also needs refill for the nebulizer but also has inquired about getting a portable nebulizer so that he can carry with him. I was concerned about his swelling and offered pt daughter an appt for today with either [facility] or [facility] and stated only wants to see Dennard Schaumann but will try to "sweet"talk him to get him to see someone else and will give me a call. Please advise!  [id number] - [place], [place] [address]

## 2014-05-06 ENCOUNTER — Encounter: Payer: Self-pay | Admitting: Family Medicine

## 2014-05-06 MED ORDER — ALBUTEROL SULFATE (2.5 MG/3ML) 0.083% IN NEBU: 2.5000 mg | INHALATION_SOLUTION | Freq: Four times a day (QID) | RESPIRATORY_TRACT | Status: DC | PRN

## 2014-05-06 NOTE — Telephone Encounter (Signed)
Resume lasix and ok with refill on nebulizer

## 2014-05-07 NOTE — Telephone Encounter (Signed)
Pt daughter aware of message and solution has been refilled

## 2014-05-09 ENCOUNTER — Encounter: Payer: Self-pay | Admitting: Family Medicine

## 2014-05-09 ENCOUNTER — Ambulatory Visit (INDEPENDENT_AMBULATORY_CARE_PROVIDER_SITE_OTHER): Payer: BLUE CROSS/BLUE SHIELD | Admitting: Family Medicine

## 2014-05-09 VITALS — BP 120/84 | HR 100 | Temp 98.0°F | Resp 20 | Ht 70.0 in | Wt 180.0 lb

## 2014-05-09 DIAGNOSIS — I2609 Other pulmonary embolism with acute cor pulmonale: Secondary | ICD-10-CM

## 2014-05-09 DIAGNOSIS — J449 Chronic obstructive pulmonary disease, unspecified: Secondary | ICD-10-CM | POA: Diagnosis not present

## 2014-05-09 DIAGNOSIS — IMO0001 Reserved for inherently not codable concepts without codable children: Secondary | ICD-10-CM

## 2014-05-09 DIAGNOSIS — R609 Edema, unspecified: Secondary | ICD-10-CM

## 2014-05-09 MED ORDER — FUROSEMIDE 80 MG PO TABS: 80.0000 mg | ORAL_TABLET | Freq: Two times a day (BID) | ORAL | Status: DC

## 2014-05-09 NOTE — Progress Notes (Signed)
Subjective:    Patient ID: Randall Sawyer, male    DOB: [birth year], 59 y.o.   MRN: [id number]  HPI    Subjective:    Patient ID: Randall Sawyer, male    DOB: 25-Feb-1955, 59 y.o.   MRN: [id number]  HPI 03/07/14 Please see my last office visits. Patient has been complaining of swelling in his legs and his abdomen. He does have +1 edema in his legs today. He has been taking Lasix 60 mg once daily which does seem to help in the morning but the swelling returned by the end of the day. Echocardiogram of the heart revealed grade 1 diastolic dysfunction but no overt heart failure. They were unable to assess for pulmonary hypertension. Ultrasound of the abdomen revealed no ascites. Reveal no cirrhosis. It also revealed no abdominal masses or blockages area therefore at the present time the patient swelling is likely due to venous insufficiency, diastolic dysfunction, and likely some element of right-sided heart failure due to COPD and even possibly sleep apnea for which the patient has not been screened. Unfortunately he is not responding to Lasix or metolazone due to acute renal insufficiency.  At that time, my plan was: I believe this is likely peripheral edema due to venous insufficiency coupled with diastolic dysfunction, there is likely some mild right-sided heart failure due to underlying COPD. The patient may even have an element of sleep apnea. I would like the patient to try Lasix 60 mg a day and add compression stockings which are knee-high 20-30 mmHg gradient. I recommended smoking cessation. I will recheck renal function to make sure the patient is not worsening. If the swelling continues consider a sleep apnea study to rule out other causes of right-sided heart failure.  05/09/14 Patient is here today for follow-up. He has gained an additional 5 pounds since when I last saw him. He is wearing compression stockings on his legs. He is taking Lasix 40 mg a day. Even with these measures he  continues to have +1 pitting edema in both legs, abdominal bloating, fluid retention. He continues to smoke. He has decreased breath sounds bilaterally with diminished air flow and also expiratory wheezes. He is consistent in taking his stiolto.  Unfortunately he also continues to smoke. He refuses to go to sleep study because his insurance would not pay for it he cannot afford the $2000 it would cost. He continues to complain of swelling. He is confused as to why he is swelling of the fluid is not improving on the diuretic Past Medical History  Diagnosis Date  . Nephrolithiasis   . Tobacco abuse   . COPD (chronic obstructive pulmonary disease)    No past surgical history on file. Current Outpatient Prescriptions on File Prior to Visit  Medication Sig Dispense Refill  . albuterol (PROVENTIL) (2.5 MG/3ML) 0.083% nebulizer solution Take 3 mLs (2.5 mg total) by nebulization every 6 (six) hours as needed for wheezing or shortness of breath. 150 mL 1  . furosemide (LASIX) 40 MG tablet Take 1.5 tablets (60 mg total) by mouth daily. 135 tablet 3  . VENTOLIN HFA 108 (90 BASE) MCG/ACT inhaler INHALE 2 PUFFS BY MOUTH EVERY 4 HOURS AS NEEDED 18 g 11   No current facility-administered medications on file prior to visit.   No Known Allergies History   Social History  . Marital Status: Divorced    Spouse Name: N/A  . Number of Children: N/A  . Years of Education: N/A   Occupational History  .  Not on file.   Social History Main Topics  . Smoking status: Current Some Day Smoker -- 1.25 packs/day    Types: Cigarettes    Start date: 12/30/1964  . Smokeless tobacco: Never Used  . Alcohol Use: No  . Drug Use: No  . Sexual Activity:    Partners: Female   Other Topics Concern  . Not on file   Social History Narrative     Review of Systems  All other systems reviewed and are negative.      Objective:   Physical Exam  Neck: No JVD present.  Cardiovascular: Normal rate, regular rhythm and  normal heart sounds.   Pulmonary/Chest: Effort normal. He has wheezes.  Abdominal: Soft. Bowel sounds are normal. He exhibits no distension. There is no tenderness. There is no rebound.  Musculoskeletal: He exhibits edema.  Vitals reviewed.         Assessment & Plan:  Acute cor pulmonale - Plan: furosemide (LASIX) 80 MG tablet, Ambulatory referral to Pulmonology  COPD with bronchitis - Plan: Ambulatory referral to Pulmonology  Edema  I explained to the patient that I believe the swelling is due to cor pulmonale which is right-sided heart failure due to his underlying pulmonary pathology. I explained to him that the right side of his heart cannot compensate with the damage in his lungs due to his smoking, his COPD. I also suspect that the patient has sleep apnea which is contributing to this as well. I explained to the patient that as long as he continued to smoke this would only worsen. We will temporarily increase his Lasix to 80 mg by mouth twice a day. I recommended he use a potassium tablet once a day 20 mEq. I will recheck the patient next week on Friday. I have asked him to call me with his weight on Monday. As soon as his weight begins to fall we will decrease his diuretics. I will also schedule the patient to see a pulmonologist to maximize his therapy for COPD and hopefully his sleep study approved. However I also explained to the patient that he has some control over this edema and that if it concerns him greatly he should work towards quitting smoking as this is contributing to the swelling by causing the lung condition which exacerbates it.  Review of Systems     Objective:   Physical Exam        Assessment & Plan:

## 2014-05-12 ENCOUNTER — Encounter: Payer: Self-pay | Admitting: Family Medicine

## 2014-05-13 ENCOUNTER — Encounter: Payer: Self-pay | Admitting: Family Medicine

## 2014-05-16 ENCOUNTER — Ambulatory Visit (INDEPENDENT_AMBULATORY_CARE_PROVIDER_SITE_OTHER): Payer: BLUE CROSS/BLUE SHIELD | Admitting: Family Medicine

## 2014-05-16 ENCOUNTER — Encounter: Payer: Self-pay | Admitting: Family Medicine

## 2014-05-16 ENCOUNTER — Ambulatory Visit
Admission: RE | Admit: 2014-05-16 | Discharge: 2014-05-16 | Disposition: A | Discharge status: Home / Self Care | Payer: BLUE CROSS/BLUE SHIELD | Source: Ambulatory Visit | Attending: Family Medicine | Admitting: Family Medicine

## 2014-05-16 VITALS — BP 132/74 | HR 100 | Temp 97.8°F | Resp 24 | Ht 70.0 in | Wt 176.0 lb

## 2014-05-16 DIAGNOSIS — I2609 Other pulmonary embolism with acute cor pulmonale: Secondary | ICD-10-CM | POA: Diagnosis not present

## 2014-05-16 DIAGNOSIS — R0609 Other forms of dyspnea: Secondary | ICD-10-CM

## 2014-05-16 LAB — BASIC METABOLIC PANEL WITH GFR
BUN: 17 mg/dL (ref 6–23)
CO2: 36 meq/L — AB (ref 19–32)
Calcium: 9.4 mg/dL (ref 8.4–10.5)
Chloride: 86 mEq/L — ABNORMAL LOW (ref 96–112)
Creat: 1.23 mg/dL (ref 0.50–1.35)
GFR, EST AFRICAN AMERICAN: 73 mL/min
GFR, Est Non African American: 63 mL/min
GLUCOSE: 87 mg/dL (ref 70–99)
POTASSIUM: 4.1 meq/L (ref 3.5–5.3)
SODIUM: 134 meq/L — AB (ref 135–145)

## 2014-05-16 NOTE — Progress Notes (Signed)
Subjective:    Patient ID: Randall Sawyer, male    DOB: Feb 23, 1955, 59 y.o.   MRN: [id number]  HPI  03/07/14 Please see my last office visits. Patient has been complaining of swelling in his legs and his abdomen. He does have +1 edema in his legs today. He has been taking Lasix 60 mg once daily which does seem to help in the morning but the swelling returned by the end of the day. Echocardiogram of the heart revealed grade 1 diastolic dysfunction but no overt heart failure. They were unable to assess for pulmonary hypertension. Ultrasound of the abdomen revealed no ascites. Reveal no cirrhosis. It also revealed no abdominal masses or blockages area therefore at the present time the patient swelling is likely due to venous insufficiency, diastolic dysfunction, and likely some element of right-sided heart failure due to COPD and even possibly sleep apnea for which the patient has not been screened. Unfortunately he is not responding to Lasix or metolazone due to acute renal insufficiency.  At that time, my plan was: I believe this is likely peripheral edema due to venous insufficiency coupled with diastolic dysfunction, there is likely some mild right-sided heart failure due to underlying COPD. The patient may even have an element of sleep apnea. I would like the patient to try Lasix 60 mg a day and add compression stockings which are knee-high 20-30 mmHg gradient. I recommended smoking cessation. I will recheck renal function to make sure the patient is not worsening. If the swelling continues consider a sleep apnea study to rule out other causes of right-sided heart failure.  05/09/14 Patient is here today for follow-up. He has gained an additional 5 pounds since when I last saw him. He is wearing compression stockings on his legs. He is taking Lasix 40 mg a day. Even with these measures he continues to have +1 pitting edema in both legs, abdominal bloating, fluid retention. He continues to smoke. He  has decreased breath sounds bilaterally with diminished air flow and also expiratory wheezes. He is consistent in taking his stiolto.  Unfortunately he also continues to smoke. He refuses to go to sleep study because his insurance would not pay for it he cannot afford the $2000 it would cost. He continues to complain of swelling. He is confused as to why he is swelling of the fluid is not improving on the diuretic.  At that time, my plan was: I explained to the patient that I believe the swelling is due to cor pulmonale which is right-sided heart failure due to his underlying pulmonary pathology. I explained to him that the right side of his heart cannot compensate with the damage in his lungs due to his smoking, his COPD. I also suspect that the patient has sleep apnea which is contributing to this as well. I explained to the patient that as long as he continued to smoke this would only worsen. We will temporarily increase his Lasix to 80 mg by mouth twice a day. I recommended he use a potassium tablet once a day 20 mEq. I will recheck the patient next week on Friday. I have asked him to call me with his weight on Monday. As soon as his weight begins to fall we will decrease his diuretics. I will also schedule the patient to see a pulmonologist to maximize his therapy for COPD and hopefully his sleep study approved. However I also explained to the patient that he has some control over this edema and that  if it concerns him greatly he should work towards quitting smoking as this is contributing to the swelling by causing the lung condition which exacerbates it.  05/16/14 Patient is here today for follow-up. He is scheduled to see the pulmonologist on May 18. Unfortunately the patient continues to smoke. He has not had an exacerbation in asked the reason I have not put him on an inhaled steroid. Instead I have tried to maximize bronchodilator therapy with stiolto.  Patient does think that it helps some. Since I  increased his Lasix to 80 mg twice daily, the patient has lost 4 pounds. The edema has essentially resolved in his legs. He continues to wheeze significantly in all 4 lung fields. He is now complaining of worsening dyspnea on exertion. He states that he cannot work anymore. 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 He is interested in disability. Past Medical History  Diagnosis Date  . Nephrolithiasis   . Tobacco abuse   . COPD (chronic obstructive pulmonary disease)    No past surgical history on file. Current Outpatient Prescriptions on File Prior to Visit  Medication Sig Dispense Refill  . albuterol (PROVENTIL) (2.5 MG/3ML) 0.083% nebulizer solution Take 3 mLs (2.5 mg total) by nebulization every 6 (six) hours as needed for wheezing or shortness of breath. 150 mL 1  . furosemide (LASIX) 40 MG tablet Take 1.5 tablets (60 mg total) by mouth daily. 135 tablet 3  . furosemide (LASIX) 80 MG tablet Take 1 tablet (80 mg total) by mouth 2 (two) times daily. 30 tablet 3  . Tiotropium Bromide-Olodaterol 2.5-2.5 MCG/ACT AERS Inhale 2 Inhalers into the lungs daily.     . VENTOLIN HFA 108 (90 BASE) MCG/ACT inhaler INHALE 2 PUFFS BY MOUTH EVERY 4 HOURS AS NEEDED 18 g 11   No current facility-administered medications on file prior to visit.   No Known Allergies History   Social History  . Marital Status: Divorced    Spouse Name: N/A  . Number of Children: N/A  . Years of Education: N/A   Occupational History  . Not on file.   Social History Main Topics  . Smoking status: Current Some Day Smoker -- 1.25 packs/day    Types: Cigarettes    Start date: 12/30/1964  . Smokeless tobacco: Never Used  . Alcohol Use: No  . Drug Use: No  . Sexual Activity:    Partners: Female   Other Topics Concern  . Not on file   Social History Narrative      Review of Systems  All other systems reviewed and are negative.      Objective:    Physical Exam  Constitutional: He appears well-developed and well-nourished.  Neck: No JVD present.  Cardiovascular: Normal rate, regular rhythm and normal heart sounds.   Pulmonary/Chest: Effort normal. He has wheezes.  Abdominal: Soft. Bowel sounds are normal. He exhibits no distension. There is no tenderness. There is no rebound.  Musculoskeletal: He exhibits no edema.  Vitals reviewed.         Assessment & Plan:  Acute cor pulmonale - Plan: BASIC METABOLIC PANEL WITH GFR, DG Chest 2 View  Dyspnea on exertion  At this point the patient seems euvolemic. I do not believe that he requires further diuresis. I will recheck his renal function. If his renal function has worsened, we will need to decrease his Lasix back to 40 mg twice daily. At the present time  however I will maintain the current dose as this seems to have the patient euvolemic. Unfortunately even though the patient is euvolemic he is complaining of significant dyspnea on exertion. Given his tobacco abuse I will obtain a chest x-ray today to rule out other potential causes beyond his COPD and cor pulmonale such as pulmonary malignancy. If the chest x-ray is normal I will continue his current therapy including diuretics as well as stiolto until he sees Dr. [name] on May 18th.  I continue to strongly encourage smoking cessation.

## 2014-05-21 ENCOUNTER — Encounter: Payer: Self-pay | Admitting: Family Medicine

## 2014-05-23 ENCOUNTER — Ambulatory Visit: Payer: BLUE CROSS/BLUE SHIELD | Admitting: Cardiology

## 2014-05-27 ENCOUNTER — Encounter: Payer: Self-pay | Admitting: Internal Medicine

## 2014-05-27 ENCOUNTER — Ambulatory Visit (INDEPENDENT_AMBULATORY_CARE_PROVIDER_SITE_OTHER): Payer: BLUE CROSS/BLUE SHIELD | Admitting: Internal Medicine

## 2014-05-27 VITALS — BP 148/78 | HR 101 | Ht 70.0 in | Wt 180.0 lb

## 2014-05-27 DIAGNOSIS — J449 Chronic obstructive pulmonary disease, unspecified: Secondary | ICD-10-CM

## 2014-05-27 DIAGNOSIS — F17210 Nicotine dependence, cigarettes, uncomplicated: Secondary | ICD-10-CM

## 2014-05-27 DIAGNOSIS — R600 Localized edema: Secondary | ICD-10-CM | POA: Diagnosis not present

## 2014-05-27 DIAGNOSIS — Z720 Tobacco use: Secondary | ICD-10-CM

## 2014-05-27 MED ORDER — TIOTROPIUM BROMIDE-OLODATEROL 2.5-2.5 MCG/ACT IN AERS: 2.0000 | INHALATION_SPRAY | Freq: Every day | RESPIRATORY_TRACT | Status: DC

## 2014-05-27 MED ORDER — PREDNISONE 10 MG PO TABS: ORAL_TABLET | ORAL | Status: DC

## 2014-05-27 MED ORDER — BUDESONIDE-FORMOTEROL FUMARATE 160-4.5 MCG/ACT IN AERO: INHALATION_SPRAY | RESPIRATORY_TRACT | Status: DC

## 2014-05-27 NOTE — Progress Notes (Signed)
Subjective:    Patient ID: Randall Sawyer, male    DOB: [birth year]     MRN: [id number]  HPI  31 yowm active smoker first noticed doe x around 2005 first on just albuterol then spiriva then symbicort then stiolto which works the best so far and referred to pulmonary clinic 05/27/2014 by DR Dennard Schaumann.   05/27/2014 1st [facility] office visit/ Randall Sawyer   Chief Complaint  Patient presents with  . Pulmonary Consult    referred by Dr. Dennard Schaumann SOB x 1 year. C/o of SOB, fluid retention, prod cough with yellow thick mucus.  Wheezing late in the evenings. Left sided abdominal pain.   indolent onset progressive x 5 y doe now x [employer] leaning on  cart slower than nl pace = [facility] 2 and can't do even one flight of steps Does ok flat at hs but occ gets choked / am congestion all year long/ each am, minimal mucus   Does stiolto first thing in am Ventolin up to 4 x daily, neb just in evening a couple times a week  Lasix 40 mg twice daily= new change one week prior to OV  > legs are much better now  In terms of doe= No obvious   day to day or daytime variabilty or assoc  cp or chest tightness, subjective wheeze overt sinus or hb symptoms. No unusual exp hx or h/o childhood pna/ asthma or knowledge of premature birth.  Sleeping ok without nocturnal  or early am exacerbation  of respiratory  c/o's or need for noct saba. Also denies any obvious fluctuation of symptoms with weather or environmental changes or other aggravating or alleviating factors except as outlined above   Current Medications, Allergies, Complete Past Medical History, Past Surgical History, Family History, and Social History were reviewed in [employer] record.             Review of Systems  Constitutional: Positive for unexpected weight change. Negative for fever.  HENT: Positive for sinus pressure. Negative for congestion, dental problem, ear pain, mouth sores, nosebleeds, postnasal drip, rhinorrhea,  sneezing, sore throat and trouble swallowing.   Eyes: Negative for redness and itching.  Respiratory: Positive for cough, chest tightness, shortness of breath and wheezing.   Cardiovascular: Positive for palpitations and leg swelling.  Gastrointestinal: Negative for nausea and vomiting.  Genitourinary: Negative for dysuria.  Musculoskeletal: Positive for joint swelling.  Skin: Negative for rash.  Neurological: Negative for headaches.  Hematological: Bruises/bleeds easily.  Psychiatric/Behavioral: Negative for dysphoric mood. The patient is nervous/anxious.        Objective:   Physical Exam  amb wm nad    Wt Readings from Last 3 Encounters:  05/27/14 180 lb (81.647 kg)  05/16/14 176 lb (79.833 kg)  05/09/14 180 lb (81.647 kg)    Vital signs reviewed   HEENT mild turbinate edema.  Oropharynx no thrush or excess pnd or cobblestoning.  No JVD or cervical adenopathy. Mild accessory muscle hypertrophy. Trachea midline, nl thryroid. Chest was hyperinflated by percussion with diminished breath sounds and moderate increased exp time without wheeze. Hoover sign positive at mid inspiration. Regular rate and rhythm without murmur gallop or rub or increase P2 or edema.  Abd: no hsm, nl excursion. Ext warm without cyanosis or clubbing.    I personally reviewed images and agree with radiology impression as follows:  CXR:  05/16/14  Mild COPD without acute abnormality.  Labs  reviewed:     Chemistry  Component Value Date/Time   NA 134* 05/16/2014 1224   K 4.1 05/16/2014 1224   CL 86* 05/16/2014 1224   CO2 36* 05/16/2014 1224   BUN 17 05/16/2014 1224   CREATININE 1.23 05/16/2014 1224      Component Value Date/Time   CALCIUM 9.4 05/16/2014 1224   ALKPHOS 93 03/07/2014 0945   AST 14 03/07/2014 0945   ALT 12 03/07/2014 0945   BILITOT 0.4 03/07/2014 0945       Lab Results  Component Value Date   WBC 6.3 03/07/2014   HGB 15.2 03/07/2014   HCT 45.0 03/07/2014   MCV 99.1  03/07/2014   PLT 258 03/07/2014           Assessment & Plan:

## 2014-05-27 NOTE — Assessment & Plan Note (Signed)

## 2014-05-27 NOTE — Assessment & Plan Note (Signed)
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 with chronic hypercarbic RF/ cor pulmonale  with difficult to control symptoms  DDX of  difficult airways management all start with A and  include Adherence, Ace Inhibitors, Acid Reflux, Active Sinus Disease, Alpha 1 Antitripsin deficiency, Anxiety masquerading as Airways dz,  ABPA,  allergy(esp in young), Aspiration (esp in elderly), Adverse effects of DPI,  Active smokers, plus two Bs  = Bronchiectasis and Beta blocker use..and one C= CHF  Adherence is always the initial "prime suspect" and is a multilayered concern that requires a "trust but verify" approach in every patient - starting with knowing how to use medications, especially inhalers, correctly, keeping up with refills and understanding the fundamental difference between maintenance and prns vs those medications only taken for a very short course and then stopped and not refilled.  - The proper method of use, as well as anticipated side effects, of a metered-dose inhaler are discussed and demonstrated to the patient. Improved effectiveness after extensive coaching during this visit to a level of approximately  90% so try symbicot and spiriva   Active smoking > discussed sepa  ? Allergy component suggested by prev response to prednisone > try Prednisone 10 mg take  4 each am x 2 days,   2 each am x 2 days,  1 each am x 2 days and stop  F/u in 2 weeks with spirometry

## 2014-05-27 NOTE — Patient Instructions (Addendum)
Plan A =  Automatic = stop stiolto and start Symbicort 160 Take 2 puffs first thing in am and then another 2 puffs about 12 hours later.                                      spiriva 2 puffs each am            Plan B = Back up - Only use your albuterol as a rescue medication to be used if you can't catch your breath by resting or doing a relaxed purse lip breathing pattern.  - The less you use it, the better it will work when you need it. - Ok to use up to 2 puffs  every 4 hours if you must but call for immediate appointment if use goes up over your usual need - Don't leave home without it !!  (think of it like the spare tire for your car)   Plan C = crisis - Your albuterol nebulizer can be used up to every  4 hours with the goal of not needing at all eventually   Prednisone 10 mg take  4 each am x 2 days,   2 each am x 2 days,  1 each am x 2 days and stop   The key is to stop smoking completely before smoking completely stops you!   Please schedule a follow up office visit in 2 weeks, sooner if needed

## 2014-05-27 NOTE — Assessment & Plan Note (Signed)
Suspect cor pulmonale > consider adding low dose aldactone but will need to be closely monitored so defer to primary care   Will need ono ra and with ex to eval p do "tune up" x 2 weeks

## 2014-05-30 ENCOUNTER — Telehealth: Payer: Self-pay | Admitting: Internal Medicine

## 2014-05-30 NOTE — Telephone Encounter (Signed)
Spoke with pt, he has disability paperwork he is wanting our office to fill out for him.  I advised pt to take this medication down to [facility] in the basement and directed him where to go.  Nothing further needed at this time.

## 2014-06-11 ENCOUNTER — Encounter: Payer: Self-pay | Admitting: Internal Medicine

## 2014-06-11 ENCOUNTER — Ambulatory Visit (INDEPENDENT_AMBULATORY_CARE_PROVIDER_SITE_OTHER): Payer: BLUE CROSS/BLUE SHIELD | Admitting: Internal Medicine

## 2014-06-11 VITALS — BP 132/82 | HR 107 | Ht 70.0 in | Wt 179.0 lb

## 2014-06-11 DIAGNOSIS — J449 Chronic obstructive pulmonary disease, unspecified: Secondary | ICD-10-CM

## 2014-06-11 DIAGNOSIS — R0602 Shortness of breath: Secondary | ICD-10-CM | POA: Diagnosis not present

## 2014-06-11 MED ORDER — TIOTROPIUM BROMIDE MONOHYDRATE 2.5 MCG/ACT IN AERS: INHALATION_SPRAY | RESPIRATORY_TRACT | Status: DC

## 2014-06-11 MED ORDER — BUDESONIDE-FORMOTEROL FUMARATE 160-4.5 MCG/ACT IN AERO: INHALATION_SPRAY | RESPIRATORY_TRACT | Status: DC

## 2014-06-11 NOTE — Patient Instructions (Addendum)
Plan A =  Automatic =  Symbicort 160 Take 2 puffs first thing in am and then another 2 puffs about 12 hours later.                                      spiriva 2 puffs each am           Plan B = Back up - Only use your albuterol as a rescue medication to be used if you can't catch your breath by resting or doing a relaxed purse lip breathing pattern.  - The less you use it, the better it will work when you need it. - Ok to use up to 2 puffs  every 4 hours if you must but call for immediate appointment if use goes up over your usual need - Don't leave home without it !!  (think of it like the spare tire for your car)   Plan C = crisis - Your albuterol nebulizer can be used up to every  4 hours with the goal of not needing at all eventually   The key is to stop smoking completely before smoking completely stops you!   Please schedule a follow up office visit in 6 weeks, call sooner if needed - you may need to look at your insurance formulary for the best prices

## 2014-06-11 NOTE — Progress Notes (Signed)
Subjective:    Patient ID: Randall Sawyer, Randall Sawyer    DOB: Jun 07, 1955     MRN: [id number]    Brief patient profile:  32 yowm active smoker first noticed doe x around 2005 first on just albuterol then spiriva then symbicort then stiolto which works the best so far and referred to pulmonary clinic 05/27/2014 by Dr Dennard Schaumann.   History of Present Illness  05/27/2014 1st [facility] office visit/ Randall Sawyer   Chief Complaint  Patient presents with  . Pulmonary Consult    referred by Dr. Dennard Schaumann SOB x 1 year. C/o of SOB, fluid retention, prod cough with yellow thick mucus.  Wheezing late in the evenings. Left sided abdominal pain.   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                                      spiriva 2 puffs each am  Plan B = Back up - Only use your albuterol as a rescue medication  Plan C = crisis - Your albuterol nebulizer can be used up to every  4 hours with the goal of not needing at all eventually  Prednisone 10 mg take  4 each am x 2 days,   2 each am x 2 days,  1 each am x 2 days and stop      06/11/2014 f/u ov/Dalphine Cowie re: copd still smoking   Chief Complaint  Patient presents with  . Follow-up    fluid retention not as bad; cough; chest tightness;states here for paperwork;  last used ventolin 1 hours - has not tried rechallenge/ sleeps ok  Just walking to mb makes him stop one half way going both ways though uphill to it.  Not needing neb as much  No noct or  early am exac  Has not worked since 05/27/14   No obvious day to day or daytime variabilty or assoc chronic cough or cp or chest tightness, subjective wheeze overt sinus or hb symptoms. No unusual exp hx or h/o childhood pna/ asthma or knowledge of premature birth.  Sleeping ok without nocturnal  or early am exacerbation  of respiratory  c/o's or need for noct saba. Also denies any obvious fluctuation of symptoms with weather or environmental changes or other aggravating or alleviating factors except as outlined above   Current Medications, Allergies, Complete Past Medical History, Past Surgical History, Family History, and Social History were reviewed in [employer] record.  ROS  The following are not active complaints unless bolded sore throat, dysphagia, dental problems, itching, sneezing,  nasal congestion or excess/ purulent secretions, ear ache,   fever, chills, sweats, unintended wt loss, pleuritic or exertional cp, hemoptysis,  orthopnea pnd or leg swelling, presyncope, palpitations, heartburn,  abdominal pain, anorexia, nausea, vomiting, diarrhea  or change in bowel or urinary habits, change in stools or urine, dysuria,hematuria,  rash, arthralgias, visual complaints, headache, numbness weakness or ataxia or problems with walking or coordination,  change in mood/affect or memory.        Objective:   Physical Exam  amb wm nad   06/11/2014           179  Wt Readings from Last 3 Encounters:  05/27/14 180 lb (81.647 kg)  05/16/14 176 lb (79.833 kg)  05/09/14 180 lb (81.647 kg)    Vital signs reviewed   HEENT mild turbinate edema.  Oropharynx no thrush or excess pnd or cobblestoning.  No JVD or cervical adenopathy. Mild accessory muscle hypertrophy. Trachea midline, nl thryroid. Chest was hyperinflated by percussion with diminished breath sounds and moderate increased exp time without wheeze. Hoover sign positive at mid inspiration. Regular rate and rhythm without  murmur gallop or rub or increase P2- 1+ pitting bilateral lower ext edema. Abd: no hsm, nl excursion. Ext warm without cyanosis or clubbing.    I personally reviewed images and agree with radiology impression as follows:  CXR:  05/16/14  Mild COPD without acute abnormality.  Labs  reviewed:     Chemistry      Component Value Date/Time   NA 134* 05/16/2014 1224   K 4.1 05/16/2014 1224   CL 86* 05/16/2014 1224   CO2 36* 05/16/2014 1224   BUN 17 05/16/2014 1224   CREATININE 1.23 05/16/2014 1224      Component Value Date/Time   CALCIUM 9.4 05/16/2014 1224   ALKPHOS 93 03/07/2014 0945   AST 14 03/07/2014 0945   ALT 12 03/07/2014 0945   BILITOT 0.4 03/07/2014 0945       Lab Results  Component Value Date   WBC 6.3 03/07/2014   HGB 15.2 03/07/2014   HCT 45.0 03/07/2014   MCV 99.1 03/07/2014   PLT 258 03/07/2014           Assessment & Plan:

## 2014-06-13 ENCOUNTER — Telehealth: Payer: Self-pay | Admitting: Internal Medicine

## 2014-06-13 NOTE — Telephone Encounter (Signed)
LMTCB x 1 for [employer]

## 2014-06-13 NOTE — Telephone Encounter (Signed)
Called and spoke with patient's daughter, she said that the Spiriva Respimat is too expensive, it is $180.  I called the pharmacy and they pulled up the Handihaler, it was also $180, they tried Advair 250/50 and it was $175.    Dr. [name], do you have any ideas as to what medication patient can take that is cheaper for him?  Please advise.

## 2014-06-13 NOTE — Telephone Encounter (Signed)
Try incruse and tudorza next and if that fails then atrovent hfa 2 qid may be needed

## 2014-06-16 ENCOUNTER — Telehealth: Payer: Self-pay | Admitting: Internal Medicine

## 2014-06-16 MED ORDER — UMECLIDINIUM BROMIDE 62.5 MCG/INH IN AEPB: 1.0000 | INHALATION_SPRAY | Freq: Every day | RESPIRATORY_TRACT | Status: DC

## 2014-06-16 NOTE — Telephone Encounter (Signed)
Spoke with patient and he is aware to take Incruse with Symbicort.  Incruse sent to pharmacy. Nothing further needed.

## 2014-06-16 NOTE — Telephone Encounter (Signed)
Sorry it's written ambigously   She should try adding incruse to symbicort and if that's not covered then will need to return here for free turdoza trial (much harder to use the device than Incruse which should be much easier to call in now/ figure out on her own)

## 2014-06-16 NOTE — Assessment & Plan Note (Addendum)
-   HC03 36 05/16/14 - 05/27/2014 p extensive coaching HFA effectiveness =    90% > try symbicort/ spiriva respimat  - 06/11/14 Spriometry FEV1  0.57 (15%) ratio 34 1 h p last saba    I had an extended discussion with the patient reviewing all relevant studies completed to date and  lasting 15 to 20 minutes of a 25 minute visit on the following ongoing concerns:  1) his spirometry is even worse than I anticipated and is c/w endstage copd and he needs to consider filing for full time disability asap  2) in this setting stopping smoking is a matter of life and breath.  3) Each maintenance medication was reviewed in detail including most importantly the difference between maintenance and as needed and under what circumstances the prns are to be used.  Please see instructions for details which were reviewed in writing and the patient given a copy.     4) - Formulary restrictions will be an ongoing challenge for the forseable future and I would be happy to pick an alternative if the pt will first  provide me a list of them but pt  will need to return here for training for any new device that is required eg dpi vs hfa vs respimat.    In meantime we can always provide samples so the patient never runs out of any needed respiratory medications.  5)f/u at 6 weeks with formulary to help him pick most cost effective meds

## 2014-06-16 NOTE — Telephone Encounter (Signed)
ATC fast busy signal x 3 wcb 

## 2014-06-16 NOTE — Telephone Encounter (Signed)
Spoke with Crystal Decelles(daughter to patient); states patient can afford his Symbicort with co-pay assistance card and will continue that inhaler. She needs to know if MW wants him to continue Symbicort, start Incruse and [place] all together. MW please advise and daughter is aware we will send which inhalers we need to for patient. Thanks.

## 2014-06-17 ENCOUNTER — Other Ambulatory Visit: Payer: Self-pay | Admitting: Internal Medicine

## 2014-06-17 DIAGNOSIS — J449 Chronic obstructive pulmonary disease, unspecified: Secondary | ICD-10-CM

## 2014-06-17 NOTE — Telephone Encounter (Signed)
Give samples of incruse and move up f/u to asap with Tammy NP  with all meds/ nebs/inhalers etc and formulary from his insurance company so we can pick out best option  In meantime must stop all smoking and save money for meds as none will be free

## 2014-06-17 NOTE — Telephone Encounter (Signed)
Spoke with daughter. She reports the incruse will cost pt $133. Can't afford this. She was not given alternative. Please advise MW thanks

## 2014-06-17 NOTE — Telephone Encounter (Signed)
Left samples of Incruse at front to be picked up.  Spoke with patient's daughter and she says that patient cannot afford to come in, it costs him $70 each visit.  She would like to get the formulary book from the insurance company and call us back to give [place] options of medications that are covered instead of coming in sooner than his current appointment that is scheduled in July.  She said she would call us back and let us know what the insurance company will cover.  To [facility] for follow up

## 2014-06-18 NOTE — Telephone Encounter (Signed)
Will await her call back with insurance preferences

## 2014-07-01 ENCOUNTER — Telehealth: Payer: Self-pay | Admitting: Internal Medicine

## 2014-07-01 NOTE — Telephone Encounter (Signed)
Spoke with pt's EC and they have still not received formulary book from [employer] and it may take up to 4 weeks.  Sample of Incruse left at front desk.

## 2014-07-23 ENCOUNTER — Ambulatory Visit (INDEPENDENT_AMBULATORY_CARE_PROVIDER_SITE_OTHER): Payer: BLUE CROSS/BLUE SHIELD | Admitting: Internal Medicine

## 2014-07-23 ENCOUNTER — Encounter: Payer: Self-pay | Admitting: *Deleted

## 2014-07-23 ENCOUNTER — Encounter: Payer: Self-pay | Admitting: Internal Medicine

## 2014-07-23 VITALS — BP 130/78 | HR 104 | Ht 68.0 in | Wt 184.0 lb

## 2014-07-23 DIAGNOSIS — R0689 Other abnormalities of breathing: Secondary | ICD-10-CM

## 2014-07-23 DIAGNOSIS — J449 Chronic obstructive pulmonary disease, unspecified: Secondary | ICD-10-CM

## 2014-07-23 DIAGNOSIS — Z720 Tobacco use: Secondary | ICD-10-CM | POA: Diagnosis not present

## 2014-07-23 DIAGNOSIS — F17210 Nicotine dependence, cigarettes, uncomplicated: Secondary | ICD-10-CM

## 2014-07-23 LAB — PULMONARY FUNCTION TEST
DL/VA % pred: 84 %
DL/VA: 3.81 ml/min/mmHg/L
DLCO UNC % PRED: 65 %
DLCO unc: 19.41 ml/min/mmHg
FEF 25-75 POST: 0.46 L/s
FEF 25-75 PRE: 0.27 L/s
FEF2575-%Change-Post: 68 %
FEF2575-%PRED-POST: 16 %
FEF2575-%Pred-Pre: 9 %
FEV1-%CHANGE-POST: 16 %
FEV1-%Pred-Post: 25 %
FEV1-%Pred-Pre: 21 %
FEV1-POST: 0.84 L
FEV1-PRE: 0.72 L
FEV1FVC-%Change-Post: 0 %
FEV1FVC-%Pred-Pre: 57 %
FEV6-%Change-Post: 22 %
FEV6-%PRED-PRE: 36 %
FEV6-%Pred-Post: 45 %
FEV6-Post: 1.9 L
FEV6-Pre: 1.54 L
FEV6FVC-%CHANGE-POST: 4 %
FEV6FVC-%Pred-Post: 104 %
FEV6FVC-%Pred-Pre: 99 %
FVC-%Change-Post: 16 %
FVC-%PRED-PRE: 37 %
FVC-%Pred-Post: 43 %
FVC-Post: 1.91 L
FVC-Pre: 1.64 L
POST FEV1/FVC RATIO: 44 %
POST FEV6/FVC RATIO: 99 %
Pre FEV1/FVC ratio: 44 %
Pre FEV6/FVC Ratio: 94 %

## 2014-07-23 NOTE — Patient Instructions (Signed)
Continue symbicort 160 Take 2 puffs first thing in am and then another 2 puffs about 12 hours later  Incruse one each am   The key is to stop smoking completely before smoking completely stops you!   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

## 2014-07-23 NOTE — Progress Notes (Signed)
PFT done today. 

## 2014-07-23 NOTE — Progress Notes (Signed)
Subjective:    Patient ID: Randall Sawyer, male    DOB: 11-01-1955     MRN: [id number]    Brief patient profile:  52 yowm active smoker first noticed doe x around 2005 first on just albuterol then added spiriva then symbicort then stiolto which works the best so far and referred to pulmonary clinic 05/27/2014 by Dr Dennard Schaumann for copd eval and proved to have a GOLD IV severity  07/23/14 with hypercarbia.   History of Present Illness  05/27/2014 1st [facility] office visit/ Wert   Chief Complaint  Patient presents with  . Pulmonary Consult    referred by Dr. Dennard Schaumann SOB x 1 year. C/o of SOB, fluid retention, prod cough with yellow thick mucus.  Wheezing late in the evenings. Left sided abdominal pain.   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                                      spiriva 2 puffs each am  Plan B = Back up - Only use your albuterol as a rescue medication  Plan C = crisis - Your albuterol nebulizer can be used up to every  4 hours with the goal of not needing at all eventually  Prednisone 10 mg take  4 each am x 2 days,   2 each am x 2 days,  1 each am x 2 days and stop      06/11/2014 f/u ov/Wert re: copd still smoking   Chief Complaint  Patient presents with  . Follow-up    fluid retention not as bad; cough; chest tightness;states here for paperwork;  last used ventolin 1 hours - has not tried rechallenge/ sleeps ok  Just walking to mb makes him stop one  half way going both ways though uphill to it.  Not needing neb as much  No noct or early am exac  Has not worked since 05/27/14 rec Plan A =  Automatic =  Symbicort 160 Take 2 puffs first thing in am and then another 2 puffs about 12 hours later.                                      spiriva 2 puffs each am     Plan B = Back up - Only use your albuterol as a rescue medication Plan C = crisis - Your albuterol nebulizer can be used up to every  4 hours with the goal of not needing at all eventually  The key is to stop smoking completely before smoking completely stops you!    07/23/2014 f/u ov/[facility] re: GOLD IV / copd / still smoking  Chief Complaint  Patient presents with  . Follow-up    PFT done today. Pt states his breathing is unchanged. No new co's today. He states that he has been taking albuterol inhaler about 2-3 x per day.  He states he has used neb 3 x since the last visit.   heat/ humidity make it worse   No obvious other day to day or daytime variabilty or assoc excess/ purulent sputum production or cp or chest tightness, subjective wheeze overt sinus or hb symptoms. No unusual exp hx or h/o childhood pna/ asthma or knowledge of premature birth.  Sleeping ok without nocturnal  or early am exacerbation  of respiratory  c/o's or need for noct saba. Also denies any obvious fluctuation of symptoms with weather or environmental changes or other aggravating or alleviating factors except as outlined above   Current Medications, Allergies, Complete Past Medical History, Past Surgical History, Family History, and Social History were reviewed in [employer] record.  ROS  The following are not active complaints unless bolded sore throat, dysphagia, dental problems, itching, sneezing,  nasal congestion or excess/ purulent secretions, ear ache,   fever, chills, sweats, unintended wt loss, pleuritic or exertional cp, hemoptysis,  orthopnea pnd or leg swelling, presyncope,  palpitations, heartburn, abdominal pain, anorexia, nausea, vomiting, diarrhea  or change in bowel or urinary habits, change in stools or urine, dysuria,hematuria,  rash, arthralgias, visual complaints, headache, numbness weakness or ataxia or problems with walking or coordination,  change in mood/affect or memory.        Objective:   Physical Exam  amb wm nad   06/11/2014           179 >  07/23/2014   184  Wt Readings from Last 3 Encounters:  05/27/14 180 lb (81.647 kg)  05/16/14 176 lb (79.833 kg)  05/09/14 180 lb (81.647 kg)    Vital signs reviewed   HEENT mild turbinate edema.  Oropharynx no thrush or excess pnd or cobblestoning.  No JVD or cervical adenopathy. Mild accessory muscle hypertrophy. Trachea midline, nl thryroid. Chest was hyperinflated by percussion with diminished breath sounds and moderate increased exp time without wheeze. Hoover sign positive at mid inspiration. Regular rate and rhythm without murmur gallop or rub or increase P2- 1+ pitting bilateral lower ext edema. Abd: no hsm, nl excursion. Ext warm without cyanosis or clubbing.    I personally reviewed images and agree with radiology impression as follows:  CXR:  05/16/14  Mild COPD without acute abnormality.  Labs  reviewed:     Chemistry      Component Value Date/Time   NA 134* 05/16/2014 1224   K 4.1 05/16/2014 1224   CL 86* 05/16/2014 1224   CO2 36* 05/16/2014 1224   BUN 17 05/16/2014 1224   CREATININE 1.23 05/16/2014 1224      Component Value Date/Time   CALCIUM 9.4 05/16/2014 1224   ALKPHOS 93 03/07/2014 0945   AST 14 03/07/2014 0945   ALT 12 03/07/2014 0945   BILITOT 0.4 03/07/2014 0945       Lab Results  Component Value Date   WBC 6.3 03/07/2014   HGB 15.2 03/07/2014   HCT 45.0 03/07/2014   MCV 99.1 03/07/2014   PLT 258 03/07/2014           Assessment & Plan:

## 2014-07-27 ENCOUNTER — Encounter: Payer: Self-pay | Admitting: Internal Medicine

## 2014-07-27 DIAGNOSIS — J9612 Chronic respiratory failure with hypercapnia: Secondary | ICD-10-CM

## 2014-07-27 DIAGNOSIS — J9611 Chronic respiratory failure with hypoxia: Secondary | ICD-10-CM | POA: Insufficient documentation

## 2014-07-27 NOTE — Assessment & Plan Note (Addendum)
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  For now rx symbicort 160/incruse for max rx   In meantime we can always provide samples so the patient never runs out of any needed respiratory medications.   See hypercarbia/ cig  Smoking a/p   Each maintenance medication was reviewed in detail including most importantly the difference between maintenance and prns and under what circumstances the prns are to be triggered using an action plan format that is not reflected in the computer generated alphabetically organized AVS.    Please see instructions for details which were reviewed in writing and the patient given a copy highlighting the part that I personally wrote and discussed at today's ov.

## 2014-07-27 NOTE — Assessment & Plan Note (Signed)

## 2014-07-27 NOTE — Assessment & Plan Note (Signed)
hc03  36  05/16/14 corresponding to a pc02 above 55 > no specific treatment

## 2014-07-29 ENCOUNTER — Encounter: Payer: Self-pay | Admitting: Family Medicine

## 2014-07-29 ENCOUNTER — Other Ambulatory Visit: Payer: Self-pay | Admitting: Physician Assistant

## 2014-08-08 ENCOUNTER — Telehealth: Payer: Self-pay | Admitting: Internal Medicine

## 2014-08-08 MED ORDER — UMECLIDINIUM BROMIDE 62.5 MCG/INH IN AEPB: 1.0000 | INHALATION_SPRAY | Freq: Every day | RESPIRATORY_TRACT | Status: DC

## 2014-08-08 MED ORDER — BUDESONIDE-FORMOTEROL FUMARATE 160-4.5 MCG/ACT IN AERO: INHALATION_SPRAY | RESPIRATORY_TRACT | Status: DC

## 2014-08-08 NOTE — Telephone Encounter (Signed)
Called and spoke to pt's daughter, Donella Stade. Informed her of odds are Magda Paganini has started the pt assistance process. Crystal is aware that Magda Paganini is back in the office on 8.1.16.  Magda Paganini please advise if you have started pt assistance for pt or have info on medication?

## 2014-08-08 NOTE — Telephone Encounter (Signed)
Randall Sawyer may have started the work for free meds but it would not likely have gone through a regular pharmacy - they can have samples until we straighten them out

## 2014-08-08 NOTE — Telephone Encounter (Signed)
Spoke with pt's daughter, states that on 7/13 visit pt was supposed to have free symbicort and incruse sent to the pharmacy on that visit.  No rx was refilled on that visit.  I explained to pt's daughter that we are unable to send a free rx to the pharmacy, although sometimes we have coupons for a discounted or free single inhaler to be given through the pharmacy.  Pt's daughter states this is not the case, saying that there was some 1-800 number that our office was supposed to call so that the pharmacy would give free medications to the pt.  I see no documentation of this. rx has been refilled at pt's daughter's request to verified pharmacy.   MW do you have any info about free symbicort or incruse?  Thanks!

## 2014-08-11 NOTE — Telephone Encounter (Signed)
Crystal (daughter) has not heard anything about this meds. He is out of the incruze.  CB at  [phone]

## 2014-08-12 MED ORDER — UMECLIDINIUM BROMIDE 62.5 MCG/INH IN AEPB: 1.0000 | INHALATION_SPRAY | Freq: Every day | RESPIRATORY_TRACT | Status: DC

## 2014-08-12 NOTE — Telephone Encounter (Signed)
Spoke with daughter (crystal) - aware that incruse samples have been placed up front to be picked up. Patient given [place] patient assistance paper work. Will return when complete.  Will send to [place] to follow up.

## 2014-09-03 ENCOUNTER — Other Ambulatory Visit (INDEPENDENT_AMBULATORY_CARE_PROVIDER_SITE_OTHER): Payer: Medicaid Other

## 2014-09-03 ENCOUNTER — Encounter: Payer: Self-pay | Admitting: Internal Medicine

## 2014-09-03 ENCOUNTER — Ambulatory Visit (INDEPENDENT_AMBULATORY_CARE_PROVIDER_SITE_OTHER): Payer: Medicaid Other | Admitting: Internal Medicine

## 2014-09-03 VITALS — BP 130/72 | HR 110 | Ht 70.0 in | Wt 185.0 lb

## 2014-09-03 DIAGNOSIS — R600 Localized edema: Secondary | ICD-10-CM | POA: Diagnosis not present

## 2014-09-03 DIAGNOSIS — R0602 Shortness of breath: Secondary | ICD-10-CM | POA: Diagnosis not present

## 2014-09-03 DIAGNOSIS — Z720 Tobacco use: Secondary | ICD-10-CM

## 2014-09-03 DIAGNOSIS — J449 Chronic obstructive pulmonary disease, unspecified: Secondary | ICD-10-CM

## 2014-09-03 DIAGNOSIS — F17210 Nicotine dependence, cigarettes, uncomplicated: Secondary | ICD-10-CM

## 2014-09-03 LAB — TSH: TSH: 1.06 u[IU]/mL (ref 0.35–4.50)

## 2014-09-03 LAB — BASIC METABOLIC PANEL
BUN: 16 mg/dL (ref 6–23)
CALCIUM: 9.1 mg/dL (ref 8.4–10.5)
CO2: 36 mEq/L — ABNORMAL HIGH (ref 19–32)
CREATININE: 1.13 mg/dL (ref 0.40–1.50)
Chloride: 92 mEq/L — ABNORMAL LOW (ref 96–112)
GFR: 70.19 mL/min (ref >60.00)
GLUCOSE: 97 mg/dL (ref 70–99)
Potassium: 4.2 mEq/L (ref 3.5–5.1)
Sodium: 135 mEq/L (ref 135–145)

## 2014-09-03 LAB — BRAIN NATRIURETIC PEPTIDE: PRO B NATRI PEPTIDE: 15 pg/mL (ref 0.0–100.0)

## 2014-09-03 NOTE — Patient Instructions (Addendum)
Please remember to go to the lab   department downstairs for your tests - we will call you with the results when they are available.     Please schedule a follow up visit in 3 months but call sooner if needed  

## 2014-09-03 NOTE — Progress Notes (Signed)
Subjective:    Patient ID: Randall Sawyer, male    DOB: 1955/10/01     MRN: [id number]    Brief patient profile:  2 yowm active smoker first noticed doe x around 2005 first on just albuterol then added spiriva then symbicort then stiolto which works the best so far and referred to pulmonary clinic 05/27/2014 by Dr Dennard Schaumann for copd eval and proved to have a GOLD IV severity  07/23/14 with hypercarbia.   History of Present Illness  05/27/2014 1st [facility] office visit/ Wert   Chief Complaint  Patient presents with  . Pulmonary Consult    referred by Dr. Dennard Schaumann SOB x 1 year. C/o of SOB, fluid retention, prod cough with yellow thick mucus.  Wheezing late in the evenings. Left sided abdominal pain.   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                                      spiriva 2 puffs each am  Plan B = Back up - Only use your albuterol as a rescue medication  Plan C = crisis - Your albuterol nebulizer can be used up to every  4 hours with the goal of not needing at all eventually  Prednisone 10 mg take  4 each am x 2 days,   2 each am x 2 days,  1 each am x 2 days and stop      06/11/2014 f/u ov/Wert re: copd still smoking   Chief Complaint  Patient presents with  . Follow-up    fluid retention not as bad; cough; chest tightness;states here for paperwork;  last used ventolin 1 hours - has not tried rechallenge/ sleeps ok  Just walking to mb makes him stop one  half way going both ways though uphill to it.  Not needing neb as much  No noct or early am exac  Has not worked since 05/27/14 rec Plan A =  Automatic =  Symbicort 160 Take 2 puffs first thing in am and then another 2 puffs about 12 hours later.                                      spiriva 2 puffs each am     Plan B = Back up - Only use your albuterol as a rescue medication Plan C = crisis - Your albuterol nebulizer can be used up to every  4 hours with the goal of not needing at all eventually  The key is to stop smoking completely before smoking completely stops you!    07/23/2014 f/u ov/Wert re: GOLD IV / copd / still smoking  Chief Complaint  Patient presents with  . Follow-up    PFT done today. Pt states his breathing is unchanged. No new co's today. He states that he has been taking albuterol inhaler about 2-3 x per day.  He states he has used neb 3 x since the last visit.   heat/ humidity make it worse  rec Continue symbicort 160 Take 2 puffs first thing in am and then another 2 puffs about 12 hours later Incruse one each am instead of symbicort The key is to stop smoking completely before smoking completely stops you!    09/03/2014 f/u ov/Wert re: GOLD IV copd/ leg swelling despite lasix 60  Chief Complaint  Patient presents with  . Follow-up    Pt states that his breathing has improved some. He denies any new co's. He is using ventolin 1 x per day on average and has only used neb x 1 since the last visit.     Main problem is out in heat, can do [employer] ok though with freq stops, slow pace = MMRC 2   No obvious other day to day or daytime variabilty or assoc excess/ purulent sputum production or cp or chest tightness, subjective wheeze overt sinus or hb symptoms. No unusual exp hx or h/o childhood pna/ asthma or knowledge of premature birth.  Sleeping ok without nocturnal  or early am exacerbation  of respiratory  c/o's or need for noct saba. Also denies any obvious  fluctuation of symptoms with weather or environmental changes or other aggravating or alleviating factors except as outlined above   Current Medications, Allergies, Complete Past Medical History, Past Surgical History, Family History, and Social History were reviewed in [employer] record.  ROS  The following are not active complaints unless bolded sore throat, dysphagia, dental problems, itching, sneezing,  nasal congestion or excess/ purulent secretions, ear ache,   fever, chills, sweats, unintended wt loss, pleuritic or exertional cp, hemoptysis,  orthopnea pnd or leg swelling bilateral , presyncope, palpitations, heartburn, abdominal pain, anorexia, nausea, vomiting, diarrhea  or change in bowel or urinary habits, change in stools or urine, dysuria,hematuria,  rash, arthralgias, visual complaints, headache, numbness weakness or ataxia or problems with walking or coordination,  change in mood/affect or memory.        Objective:   Physical Exam  amb wm nad gruff voice/ congested cough on fvc   06/11/2014           179 >  07/23/2014   184 > 09/03/2014 185  Wt Readings from Last 3 Encounters:  05/27/14 180 lb (81.647 kg)  05/16/14 176 lb (79.833 kg)  05/09/14 180 lb (81.647 kg)    Vital signs reviewed   HEENT mild turbinate edema.  Oropharynx no thrush or excess pnd or cobblestoning.  No JVD or cervical adenopathy. Mild accessory muscle hypertrophy. Trachea midline, nl thryroid. Chest was hyperinflated by percussion with diminished breath sounds and moderate increased exp time with late exp bilat rhonchi.  Hoover sign positive at mid inspiration. Regular rate and rhythm without murmur gallop or rub or increase P2- 1+ pitting bilateral lower ext edema. Abd: no hsm, nl excursion. Ext warm without cyanosis or clubbing.    I personally reviewed images and agree with radiology impression as follows:  CXR:  05/16/14  Mild COPD without acute abnormality.     Labs ordered/  reviewed:    Lab 09/03/14 1412  NA 135  K 4.2  CL 92*  CO2 36*  BUN  16  CREATININE 1.13  GLUCOSE 97      Lab Results  Component Value Date   TSH 1.06 09/03/2014     Lab Results  Component Value Date   PROBNP 15.0 09/03/2014                   Assessment & Plan:

## 2014-09-04 ENCOUNTER — Other Ambulatory Visit: Payer: Self-pay | Admitting: Internal Medicine

## 2014-09-04 ENCOUNTER — Encounter: Payer: Self-pay | Admitting: Internal Medicine

## 2014-09-04 DIAGNOSIS — J449 Chronic obstructive pulmonary disease, unspecified: Secondary | ICD-10-CM

## 2014-09-04 NOTE — Assessment & Plan Note (Signed)

## 2014-09-04 NOTE — Assessment & Plan Note (Signed)
No evidence chf

## 2014-09-04 NOTE — Assessment & Plan Note (Addendum)
-   HC03 36 05/16/14 - 05/27/2014 p extensive coaching HFA effectiveness =    90% > try symbicort/ incruse  - 06/11/14 Spriometry FEV1  0.57 (15%) ratio 34 1 h p last saba  - PFTs 07/23/2014   FEV1  0.84 (25%) ratio 44 p 16% improvement and dlco 65 p am symbicort  - -  09/03/2014  Walked RA x 3 laps @ 185 ft each stopped due to  End of study, nl pace, no sob or desat     Although he clearly has severe COPD he is relatively well compensated despite smoking on his present regimen which consists of a combination of Symbicort 162 puffs twice a day and incruse each a.m.  I had an extended discussion with the patient reviewing all relevant studies completed to date and  lasting 15 to 20 minutes of a 25 minute visit    Each maintenance medication was reviewed in detail including most importantly the difference between maintenance and prns and under what circumstances the prns are to be triggered using an action plan format that is not reflected in the computer generated alphabetically organized AVS.    Please see instructions for details which were reviewed in writing and the patient given a copy highlighting the part that I personally wrote and discussed at today's ov.

## 2014-09-04 NOTE — Assessment & Plan Note (Signed)
No evidence of left heart failure though certainly could have incipient cor pulmonale and may be at risk of nocturnal desaturation so we do need to do an overnight oximetry on room air > continue diuretic rx per primary care

## 2014-09-04 NOTE — Progress Notes (Signed)
Quick Note:  Spoke with pt and notified of results per Dr. Wert. Pt verbalized understanding and denied any questions.  ______ 

## 2014-09-11 ENCOUNTER — Encounter: Payer: Self-pay | Admitting: Internal Medicine

## 2014-09-11 ENCOUNTER — Telehealth: Payer: Self-pay | Admitting: *Deleted

## 2014-09-11 NOTE — Telephone Encounter (Signed)
PA approved through [place] from today until 09/06/15.  Pharmacy informed. Medicaid ID [id number] O Ref# [id number].

## 2014-09-17 ENCOUNTER — Telehealth: Payer: Self-pay | Admitting: Internal Medicine

## 2014-09-17 DIAGNOSIS — R0689 Other abnormalities of breathing: Secondary | ICD-10-CM

## 2014-09-17 NOTE — Telephone Encounter (Signed)
Per Dr. [name]- ONO on RA was pos- needs to start o2 1lpm with sleep and repeat ONO on 1lpm  LMTCB for the pt

## 2014-09-19 NOTE — Telephone Encounter (Signed)
Spoke with pt and notified of results per Dr. [name]. Pt verbalized understanding and denied any questions. Orders sent to [facility]

## 2014-09-29 ENCOUNTER — Telehealth: Payer: Self-pay | Admitting: Internal Medicine

## 2014-09-29 NOTE — Telephone Encounter (Signed)
ONO on 1lpm o2 done by [facility]PS on 09/24/14 Per MW- this was normal, and pt needs to continue 1lpm o2 with sleep  Spoke with pt and notified of results per Dr. [name]. Pt verbalized understanding and denied any questions.

## 2014-10-16 ENCOUNTER — Encounter: Payer: Self-pay | Admitting: Family Medicine

## 2014-10-16 ENCOUNTER — Other Ambulatory Visit: Payer: Self-pay | Admitting: Internal Medicine

## 2014-10-17 ENCOUNTER — Other Ambulatory Visit: Payer: Self-pay | Admitting: Family Medicine

## 2014-10-17 DIAGNOSIS — G8929 Other chronic pain: Secondary | ICD-10-CM

## 2014-10-17 DIAGNOSIS — R1084 Generalized abdominal pain: Secondary | ICD-10-CM

## 2014-10-17 DIAGNOSIS — R1900 Intra-abdominal and pelvic swelling, mass and lump, unspecified site: Secondary | ICD-10-CM

## 2014-10-17 DIAGNOSIS — R109 Unspecified abdominal pain: Principal | ICD-10-CM

## 2014-10-17 MED ORDER — UMECLIDINIUM BROMIDE 62.5 MCG/INH IN AEPB: 1.0000 | INHALATION_SPRAY | Freq: Every day | RESPIRATORY_TRACT | Status: DC

## 2014-10-22 ENCOUNTER — Other Ambulatory Visit (HOSPITAL_COMMUNITY): Payer: Medicaid Other

## 2014-10-23 ENCOUNTER — Ambulatory Visit (HOSPITAL_COMMUNITY)
Admission: RE | Admit: 2014-10-23 | Discharge: 2014-10-23 | Disposition: A | Discharge status: Home / Self Care | Payer: Medicaid Other | Source: Ambulatory Visit | Attending: Family Medicine | Admitting: Family Medicine

## 2014-10-23 ENCOUNTER — Encounter: Payer: Self-pay | Admitting: Family Medicine

## 2014-10-23 DIAGNOSIS — R109 Unspecified abdominal pain: Secondary | ICD-10-CM | POA: Insufficient documentation

## 2014-10-23 DIAGNOSIS — K4020 Bilateral inguinal hernia, without obstruction or gangrene, not specified as recurrent: Secondary | ICD-10-CM | POA: Diagnosis not present

## 2014-10-23 DIAGNOSIS — G8929 Other chronic pain: Secondary | ICD-10-CM | POA: Diagnosis present

## 2014-10-23 LAB — POCT I-STAT CREATININE: CREATININE: 1.1 mg/dL (ref 0.61–1.24)

## 2014-10-23 MED ORDER — IOHEXOL 300 MG/ML  SOLN
100.0000 mL | Freq: Once | INTRAMUSCULAR | Status: AC | PRN
  Administered 2014-10-23: 100 mL via INTRAVENOUS

## 2014-10-24 ENCOUNTER — Encounter: Payer: Self-pay | Admitting: Family Medicine

## 2014-10-25 ENCOUNTER — Other Ambulatory Visit: Payer: Self-pay | Admitting: Family Medicine

## 2014-10-26 ENCOUNTER — Other Ambulatory Visit: Payer: Self-pay | Admitting: Family Medicine

## 2014-10-27 ENCOUNTER — Encounter: Payer: Self-pay | Admitting: *Deleted

## 2014-10-27 ENCOUNTER — Encounter: Payer: Self-pay | Admitting: Family Medicine

## 2014-11-03 ENCOUNTER — Encounter: Payer: Self-pay | Admitting: Family Medicine

## 2014-11-03 ENCOUNTER — Other Ambulatory Visit: Payer: Self-pay | Admitting: Family Medicine

## 2014-11-03 ENCOUNTER — Ambulatory Visit (INDEPENDENT_AMBULATORY_CARE_PROVIDER_SITE_OTHER): Payer: Medicaid Other | Admitting: Family Medicine

## 2014-11-03 VITALS — BP 118/74 | HR 88 | Temp 98.3°F | Resp 18 | Ht 70.0 in | Wt 195.0 lb

## 2014-11-03 DIAGNOSIS — Z13220 Encounter for screening for lipoid disorders: Secondary | ICD-10-CM | POA: Diagnosis not present

## 2014-11-03 DIAGNOSIS — R0609 Other forms of dyspnea: Secondary | ICD-10-CM | POA: Diagnosis not present

## 2014-11-03 DIAGNOSIS — Z23 Encounter for immunization: Secondary | ICD-10-CM

## 2014-11-03 DIAGNOSIS — I2609 Other pulmonary embolism with acute cor pulmonale: Secondary | ICD-10-CM | POA: Diagnosis not present

## 2014-11-03 DIAGNOSIS — R1084 Generalized abdominal pain: Secondary | ICD-10-CM | POA: Diagnosis not present

## 2014-11-03 DIAGNOSIS — Z125 Encounter for screening for malignant neoplasm of prostate: Secondary | ICD-10-CM | POA: Diagnosis not present

## 2014-11-03 NOTE — Addendum Note (Signed)
Addended by: Shary Decamp B on: 11/03/2014 09:40 AM   Modules accepted: Orders

## 2014-11-03 NOTE — Progress Notes (Signed)
Subjective:    Patient ID: Randall Sawyer, male    DOB: January 19, 1955, 59 y.o.   MRN: [id number]  HPI  03/07/14 Please see my last office visits. Patient has been complaining of swelling in his legs and his abdomen. He does have +1 edema in his legs today. He has been taking Lasix 60 mg once daily which does seem to help in the morning but the swelling returned by the end of the day. Echocardiogram of the heart revealed grade 1 diastolic dysfunction but no overt heart failure. They were unable to assess for pulmonary hypertension. Ultrasound of the abdomen revealed no ascites. Reveal no cirrhosis. It also revealed no abdominal masses or blockages area therefore at the present time the patient swelling is likely due to venous insufficiency, diastolic dysfunction, and likely some element of right-sided heart failure due to COPD and even possibly sleep apnea for which the patient has not been screened. Unfortunately he is not responding to Lasix or metolazone due to acute renal insufficiency.  At that time, my plan was: I believe this is likely peripheral edema due to venous insufficiency coupled with diastolic dysfunction, there is likely some mild right-sided heart failure due to underlying COPD. The patient may even have an element of sleep apnea. I would like the patient to try Lasix 60 mg a day and add compression stockings which are knee-high 20-30 mmHg gradient. I recommended smoking cessation. I will recheck renal function to make sure the patient is not worsening. If the swelling continues consider a sleep apnea study to rule out other causes of right-sided heart failure.  05/09/14 Patient is here today for follow-up. He has gained an additional 5 pounds since when I last saw him. He is wearing compression stockings on his legs. He is taking Lasix 40 mg a day. Even with these measures he continues to have +1 pitting edema in both legs, abdominal bloating, fluid retention. He continues to smoke. He  has decreased breath sounds bilaterally with diminished air flow and also expiratory wheezes. He is consistent in taking his stiolto.  Unfortunately he also continues to smoke. He refuses to go to sleep study because his insurance would not pay for it he cannot afford the $2000 it would cost. He continues to complain of swelling. He is confused as to why he is swelling of the fluid is not improving on the diuretic.  At that time, my plan was: I explained to the patient that I believe the swelling is due to cor pulmonale which is right-sided heart failure due to his underlying pulmonary pathology. I explained to him that the right side of his heart cannot compensate with the damage in his lungs due to his smoking, his COPD. I also suspect that the patient has sleep apnea which is contributing to this as well. I explained to the patient that as long as he continued to smoke this would only worsen. We will temporarily increase his Lasix to 80 mg by mouth twice a day. I recommended he use a potassium tablet once a day 20 mEq. I will recheck the patient next week on Friday. I have asked him to call me with his weight on Monday. As soon as his weight begins to fall we will decrease his diuretics. I will also schedule the patient to see a pulmonologist to maximize his therapy for COPD and hopefully his sleep study approved. However I also explained to the patient that he has some control over this edema and that  if it concerns him greatly he should work towards quitting smoking as this is contributing to the swelling by causing the lung condition which exacerbates it.  05/16/14 Patient is here today for follow-up. He is scheduled to see the pulmonologist on May 18. Unfortunately the patient continues to smoke. He has not had an exacerbation in asked the reason I have not put him on an inhaled steroid. Instead I have tried to maximize bronchodilator therapy with stiolto.  Patient does think that it helps some. Since I  increased his Lasix to 80 mg twice daily, the patient has lost 4 pounds. The edema has essentially resolved in his legs. He continues to wheeze significantly in all 4 lung fields. He is now complaining of worsening dyspnea on exertion. He states that he cannot work anymore. 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 He is interested in disability.  At that time, my plan was: At this point the patient seems euvolemic. I do not believe that he requires further diuresis. I will recheck his renal function. If his renal function has worsened, we will need to decrease his Lasix back to 40 mg twice daily. At the present time however I will maintain the current dose as this seems to have the patient euvolemic. Unfortunately even though the patient is euvolemic he is complaining of significant dyspnea on exertion. Given his tobacco abuse I will obtain a chest x-ray today to rule out other potential causes beyond his COPD and cor pulmonale such as pulmonary malignancy. If the chest x-ray is normal I will continue his current therapy including diuretics as well as stiolto until he sees Dr. [name] on May 18th.  I continue to strongly encourage smoking cessation.  11/03/14 Since I last saw the patient, he continued to complain of abdominal pain. Therefore I'll schedule the patient for a CT scan of the abdomen and pelvis which revealed no cause for his abdominal pain other than some coincidental findings of hernias. Therefore I have scheduled the patient to see gastroenterology:  IMPRESSION: 1. Small bilateral mesenteric fat containing inguinal hernias, left greater than right. Otherwise, no explanation for patient's abdominal pain. 2. Borderline enlarged slightly nodular appearing prostate gland with minimal facet effect upon the undersurface of the urinary bladder. Correlation with digital rectal examination (if not recently performed) is recommended. 3.  Suspected hepatic steatosis, as was suggested on prior abdominal Ultrasound.  Patient now states that the abdominal pain as a burning tingling sensation in left upper quadrant is worse when he bends over. It sounds more musculoskeletal. The pain has always been vague and seems to be migratory in nature. He is scheduled to see GI later this month. Unfortunately he continues to smoke. He is smoking a half pack of cigarettes per day. He outright refuses a flu shot. He is willing to receive Pneumovax 23. He is audibly wheezing today on exam. Otherwise he is doing well Past Medical History  Diagnosis Date  . Nephrolithiasis   . Tobacco abuse   . COPD (chronic obstructive pulmonary disease)   . Asthma   . Hypertension    No past surgical history on file. Current Outpatient Prescriptions on File Prior to Visit  Medication Sig Dispense Refill  . albuterol (PROVENTIL) (2.5 MG/3ML) 0.083% nebulizer solution Take 3 mLs (2.5 mg total) by nebulization every 6 (six) hours as needed for wheezing or shortness of breath. 150 mL 1  . budesonide-formoterol (SYMBICORT) 160-4.5 MCG/ACT  inhaler Take 2 puffs first thing in am and then another 2 puffs about 12 hours later. 1 Inhaler 6  . furosemide (LASIX) 40 MG tablet Take 60 mg by mouth daily.    Marland Kitchen PROAIR HFA 108 (90 BASE) MCG/ACT inhaler INHALE 2 PUFFS BY MOUTH EVERY 4 HOURS AS NEEDED 8.5 g 5  . Umeclidinium Bromide (INCRUSE ELLIPTA) 62.5 MCG/INH AEPB Inhale 1 puff into the lungs daily. 30 each 5   No current facility-administered medications on file prior to visit.   No Known Allergies Social History   Social History  . Marital Status: Divorced    Spouse Name: N/A  . Number of Children: N/A  . Years of Education: N/A   Occupational History  . Electrician     Social History Main Topics  . Smoking status: Current Some Day Smoker -- 1.25 packs/day    Types: Cigarettes    Start date: 12/30/1964  . Smokeless tobacco: Never Used  . Alcohol Use: No  .  Drug Use: No  . Sexual Activity:    Partners: Female   Other Topics Concern  . Not on file   Social History Narrative      Review of Systems  All other systems reviewed and are negative.      Objective:   Physical Exam  Constitutional: He appears well-developed and well-nourished.  Neck: No JVD present.  Cardiovascular: Normal rate, regular rhythm and normal heart sounds.   Pulmonary/Chest: Effort normal. He has wheezes.  Abdominal: Soft. Bowel sounds are normal. He exhibits no distension. There is no tenderness. There is no rebound.  Musculoskeletal: He exhibits no edema.  Vitals reviewed.   Nodule felt on the lower left hemisphere the prostate      Assessment & Plan:  Acute cor pulmonale (HCC)  Dyspnea on exertion - Plan: CBC with Differential/Platelet, COMPLETE METABOLIC PANEL WITH GFR  Generalized abdominal pain  Prostate cancer screening - Plan: PSA  Screening cholesterol level - Plan: Lipid panel  Recommended a flu shot but the patient refused. He will consent to a pneumonia vaccine. I have strongly encouraged the patient to quit smoking and I explained this in great detail. He is still smoking half a pack of cigarettes per day. I will obtain baseline screening lab work including a CBC, CMP, and a fasting lipid panel. I will also check a PSA. I feel the patient will need a urology consultation given the nodule I am appreciating on his prostate gland

## 2014-11-04 LAB — CBC WITH DIFFERENTIAL/PLATELET
BASOS PCT: 0 % (ref 0–1)
Basophils Absolute: 0 10*3/uL (ref 0.0–0.1)
Eosinophils Absolute: 0.2 10*3/uL (ref 0.0–0.7)
Eosinophils Relative: 3 % (ref 0–5)
HEMATOCRIT: 45.6 % (ref 39.0–52.0)
HEMOGLOBIN: 15.8 g/dL (ref 13.0–17.0)
LYMPHS ABS: 1.2 10*3/uL (ref 0.7–4.0)
LYMPHS PCT: 15 % (ref 12–46)
MCH: 34.8 pg — AB (ref 26.0–34.0)
MCHC: 34.6 g/dL (ref 30.0–36.0)
MCV: 100.4 fL — AB (ref 78.0–100.0)
MONO ABS: 0.7 10*3/uL (ref 0.1–1.0)
MONOS PCT: 8 % (ref 3–12)
MPV: 10.6 fL (ref 8.6–12.4)
NEUTROS ABS: 6.1 10*3/uL (ref 1.7–7.7)
NEUTROS PCT: 74 % (ref 43–77)
Platelets: 226 10*3/uL (ref 150–400)
RBC: 4.54 MIL/uL (ref 4.22–5.81)
RDW: 14.8 % (ref 11.5–15.5)
WBC: 8.2 10*3/uL (ref 4.0–10.5)

## 2014-11-04 LAB — COMPLETE METABOLIC PANEL WITH GFR
ALT: 24 U/L (ref 9–46)
AST: 22 U/L (ref 10–35)
Albumin: 3.6 g/dL (ref 3.6–5.1)
Alkaline Phosphatase: 103 U/L (ref 40–115)
BUN: 9 mg/dL (ref 7–25)
CHLORIDE: 89 mmol/L — AB (ref 98–110)
CO2: 35 mmol/L — AB (ref 20–31)
Calcium: 9.1 mg/dL (ref 8.6–10.3)
Creat: 0.94 mg/dL (ref 0.70–1.25)
GFR, EST NON AFRICAN AMERICAN: 88 mL/min (ref >=60)
GFR, Est African American: >89 mL/min (ref >=60)
Glucose, Bld: 94 mg/dL (ref 70–99)
POTASSIUM: 4.4 mmol/L (ref 3.5–5.3)
SODIUM: 133 mmol/L — AB (ref 135–146)
Total Bilirubin: 0.4 mg/dL (ref 0.2–1.2)
Total Protein: 6.7 g/dL (ref 6.1–8.1)

## 2014-11-04 LAB — LIPID PANEL
CHOL/HDL RATIO: 3.8 ratio (ref <=5.0)
CHOLESTEROL: 193 mg/dL (ref 125–200)
HDL: 51 mg/dL (ref >=40)
LDL CALC: 109 mg/dL (ref <130)
Triglycerides: 166 mg/dL — ABNORMAL HIGH (ref <150)
VLDL: 33 mg/dL — AB (ref <30)

## 2014-11-04 LAB — PSA: PSA: 1.53 ng/mL (ref <=4.00)

## 2014-11-05 ENCOUNTER — Encounter: Payer: Self-pay | Admitting: Internal Medicine

## 2014-11-06 ENCOUNTER — Encounter: Payer: Self-pay | Admitting: Family Medicine

## 2014-11-13 ENCOUNTER — Encounter (INDEPENDENT_AMBULATORY_CARE_PROVIDER_SITE_OTHER): Payer: Self-pay | Admitting: *Deleted

## 2014-11-13 ENCOUNTER — Encounter (INDEPENDENT_AMBULATORY_CARE_PROVIDER_SITE_OTHER): Payer: Self-pay | Admitting: Internal Medicine

## 2014-11-13 ENCOUNTER — Ambulatory Visit (INDEPENDENT_AMBULATORY_CARE_PROVIDER_SITE_OTHER): Payer: Medicaid Other | Admitting: Internal Medicine

## 2014-11-13 ENCOUNTER — Other Ambulatory Visit (INDEPENDENT_AMBULATORY_CARE_PROVIDER_SITE_OTHER): Payer: Self-pay | Admitting: Internal Medicine

## 2014-11-13 VITALS — BP 120/62 | HR 88 | Temp 97.5°F | Ht 70.0 in | Wt 196.7 lb

## 2014-11-13 DIAGNOSIS — R6881 Early satiety: Secondary | ICD-10-CM

## 2014-11-13 DIAGNOSIS — R140 Abdominal distension (gaseous): Secondary | ICD-10-CM

## 2014-11-13 NOTE — Patient Instructions (Signed)
EGD. The risks and benefits such as perforation, bleeding, and infection were reviewed with the patient and is agreeable. 

## 2014-11-13 NOTE — Progress Notes (Signed)
Subjective:    Patient ID: Randall Sawyer, male    DOB: June 22, 1955, 59 y.o.   MRN: [id number]  HPI Referred to our office by Dr.[name] for upper abdominal pain. He has underwent both a CT and [place] (see below). He tells me when he eats he began's to bloat, He says he cannot bend over because it feels likes his breath is cut off. He has early satiety.  He says he quit going out to restaurant because he cannot eat. He denies any dysphagia.  He has not lost any weight. He has actually gained 2 pounds.  Appetite is good.  Sometimes he feels needle like sensation in his rt lateral upper abdomen.   He denies having any acid reflux. He generally has a BM x 2 a day. No melena or BRRB. He has never had a colonoscopy in the past. Home 02 at night. 01/24/2014 Echo EEF 60%.   10/23/2014 CT abdomen/pelvis with CM: upper abdominal pain, bloating.  IMPRESSION: 1. Small bilateral mesenteric fat containing inguinal hernias, left greater than right. Otherwise, no explanation for patient's abdominal pain. 2. Borderline enlarged slightly nodular appearing prostate gland with minimal facet effect upon the undersurface of the urinary bladder. Correlation with digital rectal examination (if not recently performed) is recommended. 3. Suspected hepatic steatosis, as was suggested on prior abdominal ultrasound.  02/03/2014 US abdomen: abdominal pain: IMPRESSION: Echogenic liver suggesting fatty infiltration. Exam otherwise Unremarkable.  CBC    Component Value Date/Time   WBC 8.2 11/03/2014 0000   RBC 4.54 11/03/2014 0000   HGB 15.8 11/03/2014 0000   HCT 45.6 11/03/2014 0000   PLT 226 11/03/2014 0000   MCV 100.4* 11/03/2014 0000   MCH 34.8* 11/03/2014 0000   MCHC 34.6 11/03/2014 0000   RDW 14.8 11/03/2014 0000   LYMPHSABS 1.2 11/03/2014 0000   MONOABS 0.7 11/03/2014 0000   EOSABS 0.2 11/03/2014 0000   BASOSABS 0.0 11/03/2014 0000   CMP Latest Ref Rng 11/03/2014 10/23/2014 09/03/2014  Glucose  70 - 99 mg/dL 94 - 97  BUN 7 - 25 mg/dL 9 - 16  Creatinine 0.70 - 1.25 mg/dL 0.94 1.10 1.13  Sodium 135 - 146 mmol/L 133(L) - 135  Potassium 3.5 - 5.3 mmol/L 4.4 - 4.2  Chloride 98 - 110 mmol/L 89(L) - 92(L)  CO2 20 - 31 mmol/L 35(H) - 36(H)  Calcium 8.6 - 10.3 mg/dL 9.1 - 9.1  Total Protein 6.1 - 8.1 g/dL 6.7 - -  Total Bilirubin 0.2 - 1.2 mg/dL 0.4 - -  Alkaline Phos 40 - 115 U/L 103 - -  AST 10 - 35 U/L 22 - -  ALT 9 - 46 U/L 24 - -       Review of Systems Past Medical History  Diagnosis Date  . Nephrolithiasis   . Tobacco abuse   . COPD (chronic obstructive pulmonary disease) ([place])   . Asthma   . Hypertension     No past surgical history on file.  No Known Allergies  Current Outpatient Prescriptions on File Prior to Visit  Medication Sig Dispense Refill  . albuterol (PROVENTIL) (2.5 MG/3ML) 0.083% nebulizer solution Take 3 mLs (2.5 mg total) by nebulization every 6 (six) hours as needed for wheezing or shortness of breath. 150 mL 1  . budesonide-formoterol (SYMBICORT) 160-4.5 MCG/ACT inhaler Take 2 puffs first thing in am and then another 2 puffs about 12 hours later. 1 Inhaler 6  . furosemide (LASIX) 40 MG tablet Take 40 mg by mouth daily.     Marland Kitchen  PROAIR HFA 108 (90 BASE) MCG/ACT inhaler INHALE 2 PUFFS BY MOUTH EVERY 4 HOURS AS NEEDED 8.5 g 5  . Umeclidinium Bromide (INCRUSE ELLIPTA) 62.5 MCG/INH AEPB Inhale 1 puff into the lungs daily. 30 each 5   No current facility-administered medications on file prior to visit.        Objective:   Physical Exam Blood pressure 120/62, pulse 88, temperature 97.5 F (36.4 C), height '5\' 10"'$  (1.778 m), weight 196 lb 11.2 oz (89.223 kg). Alert and oriented. Skin warm and dry. Oral mucosa is moist.   . Sclera anicteric, conjunctivae is pink. Thyroid not enlarged. No cervical lymphadenopathy. Bilateral wheezes. Heart regular rate and rhythm.  Abdomen is soft. Bowel sounds are positive. No hepatomegaly. No abdominal masses felt. No  tenderness.  3+ edema to lower extremities.          Assessment & Plan:  Bloating. Early satiety. EGD. PUD needs to be ruled out.  US abdomen:abdominal distention,.looking for ascites.

## 2014-11-17 ENCOUNTER — Encounter (INDEPENDENT_AMBULATORY_CARE_PROVIDER_SITE_OTHER): Payer: Self-pay | Admitting: Internal Medicine

## 2014-11-18 ENCOUNTER — Ambulatory Visit (HOSPITAL_COMMUNITY)
Admission: RE | Admit: 2014-11-18 | Discharge: 2014-11-18 | Disposition: A | Discharge status: Home / Self Care | Payer: Medicaid Other | Source: Ambulatory Visit | Attending: Internal Medicine | Admitting: Internal Medicine

## 2014-11-18 DIAGNOSIS — R140 Abdominal distension (gaseous): Secondary | ICD-10-CM | POA: Diagnosis not present

## 2014-11-18 DIAGNOSIS — K760 Fatty (change of) liver, not elsewhere classified: Secondary | ICD-10-CM | POA: Insufficient documentation

## 2014-11-18 DIAGNOSIS — I10 Essential (primary) hypertension: Secondary | ICD-10-CM | POA: Insufficient documentation

## 2014-11-18 DIAGNOSIS — F17200 Nicotine dependence, unspecified, uncomplicated: Secondary | ICD-10-CM | POA: Diagnosis not present

## 2014-11-18 DIAGNOSIS — J449 Chronic obstructive pulmonary disease, unspecified: Secondary | ICD-10-CM | POA: Insufficient documentation

## 2014-11-18 DIAGNOSIS — J45909 Unspecified asthma, uncomplicated: Secondary | ICD-10-CM | POA: Diagnosis not present

## 2014-11-19 ENCOUNTER — Ambulatory Visit (HOSPITAL_COMMUNITY)
Admission: RE | Admit: 2014-11-19 | Discharge: 2014-11-19 | Disposition: A | Discharge status: Home / Self Care | Payer: Medicaid Other | Source: Ambulatory Visit | Attending: Internal Medicine | Admitting: Internal Medicine

## 2014-11-19 ENCOUNTER — Encounter (HOSPITAL_COMMUNITY)
Admission: RE | Disposition: A | Discharge status: Home / Self Care | Payer: Self-pay | Source: Ambulatory Visit | Attending: Internal Medicine

## 2014-11-19 ENCOUNTER — Encounter (HOSPITAL_COMMUNITY): Payer: Self-pay | Admitting: *Deleted

## 2014-11-19 DIAGNOSIS — Q393 Congenital stenosis and stricture of esophagus: Secondary | ICD-10-CM | POA: Diagnosis not present

## 2014-11-19 DIAGNOSIS — K449 Diaphragmatic hernia without obstruction or gangrene: Secondary | ICD-10-CM | POA: Insufficient documentation

## 2014-11-19 DIAGNOSIS — R6881 Early satiety: Secondary | ICD-10-CM

## 2014-11-19 DIAGNOSIS — K259 Gastric ulcer, unspecified as acute or chronic, without hemorrhage or perforation: Secondary | ICD-10-CM | POA: Diagnosis not present

## 2014-11-19 DIAGNOSIS — K222 Esophageal obstruction: Secondary | ICD-10-CM | POA: Diagnosis not present

## 2014-11-19 DIAGNOSIS — R140 Abdominal distension (gaseous): Secondary | ICD-10-CM

## 2014-11-19 DIAGNOSIS — R1012 Left upper quadrant pain: Secondary | ICD-10-CM | POA: Diagnosis present

## 2014-11-19 DIAGNOSIS — K317 Polyp of stomach and duodenum: Secondary | ICD-10-CM | POA: Diagnosis not present

## 2014-11-19 DIAGNOSIS — K2960 Other gastritis without bleeding: Secondary | ICD-10-CM | POA: Diagnosis not present

## 2014-11-19 HISTORY — PX: ESOPHAGOGASTRODUODENOSCOPY: SHX5428

## 2014-11-19 SURGERY — EGD (ESOPHAGOGASTRODUODENOSCOPY)
Anesthesia: Moderate Sedation

## 2014-11-19 MED ORDER — MEPERIDINE HCL 50 MG/ML IJ SOLN
INTRAMUSCULAR | Status: AC
  Filled 2014-11-19: qty 1

## 2014-11-19 MED ORDER — MEPERIDINE HCL 50 MG/ML IJ SOLN
INTRAMUSCULAR | Status: DC | PRN
  Administered 2014-11-19 (×2): 25 mg via INTRAVENOUS

## 2014-11-19 MED ORDER — SODIUM CHLORIDE 0.9 % IV SOLN
INTRAVENOUS | Status: DC
  Administered 2014-11-19: 1000 mL via INTRAVENOUS

## 2014-11-19 MED ORDER — BUTAMBEN-TETRACAINE-BENZOCAINE 2-2-14 % EX AERO
INHALATION_SPRAY | CUTANEOUS | Status: DC | PRN
  Administered 2014-11-19: 2 via TOPICAL

## 2014-11-19 MED ORDER — MIDAZOLAM HCL 5 MG/5ML IJ SOLN
INTRAMUSCULAR | Status: DC | PRN
  Administered 2014-11-19 (×2): 2 mg via INTRAVENOUS

## 2014-11-19 MED ORDER — PANTOPRAZOLE SODIUM 40 MG PO TBEC: 40.0000 mg | DELAYED_RELEASE_TABLET | Freq: Every day | ORAL | Status: DC

## 2014-11-19 MED ORDER — MIDAZOLAM HCL 5 MG/5ML IJ SOLN
INTRAMUSCULAR | Status: AC
  Filled 2014-11-19: qty 10

## 2014-11-19 MED ORDER — STERILE WATER FOR IRRIGATION IR SOLN
Status: DC | PRN
  Administered 2014-11-19: 14:00:00

## 2014-11-19 MED ORDER — MIDAZOLAM HCL 5 MG/5ML IJ SOLN
INTRAMUSCULAR | Status: DC | PRN
  Administered 2014-11-19: 1 mg via INTRAVENOUS

## 2014-11-19 NOTE — Op Note (Signed)
EGD PROCEDURE REPORT  PATIENT:  Randall Sawyer  MR#:  [id number] Birthdate:  1955-12-28, 59 y.o., male Endoscopist:  Dr. Rogene Houston, MD Referred By:  Dr. Cletus Gash T. Dennard Schaumann, MD Procedure Date: 11/19/2014  Procedure:   EGD  Indications:  Patient is 57-year-old Caucasian male who presents with over a year history of postprandial bloating described intractable preventing him from eating as well as left upper quadrant abdominal pain. Workup includes negative ultrasound for cholelithiasis unremarkable abdominal pelvic CT and recent ultrasound was negative for ascites.  Comprehensive chemistry panel and CBC were unremarkable. He has not responded to OTC medications. He is undergoing diagnostic EGD.           Informed Consent:  The risks, benefits, alternatives & imponderables which include, but are not limited to, bleeding, infection, perforation, drug reaction and potential missed lesion have been reviewed.  The potential for biopsy, lesion removal, esophageal dilation, etc. have also been discussed.  Questions have been answered.  All parties agreeable.  Please see history & physical in medical record for more information.  Medications:  Demerol 50 mg IV Versed 5 mg IV Cetacaine spray topically for oropharyngeal anesthesia  Description of procedure:  The endoscope was introduced through the mouth and advanced to the second portion of the duodenum without difficulty or limitations. The mucosal surfaces were surveyed very carefully during advancement of the scope and upon withdrawal.  Findings:  Esophagus:  Mucosa of the esophagus was normal. Noncritical range noted at GE junction. GEJ:  41 cm Hiatus:  43 cm Stomach:  Stomach was empty and distended very well with insufflation. Folds in the proximal stomach were normal. Examination of mucosa at gastric body revealed small erosion along the posterior wall. Multiple erosions noted at antrum along with the small hyperplastic appearing  polyps. Duodenum:  Normal bulbar and post bulbar mucosa.  Therapeutic/Diagnostic Maneuvers Performed:  Biopsy taken from three antral polyps routine histology.  Complications:  None  EBL: None  Impression: Small sliding hiatal hernia with noncritical Schatzki's ring. Erosive gastritis along with three small antral polyps possibly hyperplastic. His polyps were biopsied for routine histology.  Comment: I wonder if most of his bloating is secondary to weight gain. If gastric biopsies negative for H. pylori infection will proceed with solid-phase gastric emptying study. Will also consider dietary consultation in order to help him lose weight.  Recommendations:  Standard instructions given. Pantoprazole 40 mg by mouth every morning. I will be contacting patient with biopsy results and further recommendations.   Arther Heisler U  11/19/2014  2:39 PM  CC: Dr. Jenna Luo TOM, MD & Dr. Rayne Du ref. provider found

## 2014-11-19 NOTE — Discharge Instructions (Signed)
Resume usual medications and diet. Pantoprazole 40 mg by mouth 30 minutes before breakfast daily. Physician will call with biopsy results and further recommendations. No driving for 24 hours.  Gastroesophageal Reflux Disease, Adult Normally, food travels down the esophagus and stays in the stomach to be digested. However, when a person has gastroesophageal reflux disease (GERD), food and stomach acid move back up into the esophagus. When this happens, the esophagus becomes sore and inflamed. Over time, GERD can create small holes (ulcers) in the lining of the esophagus.  CAUSES This condition is caused by a problem with the muscle between the esophagus and the stomach (lower esophageal sphincter, or LES). Normally, the LES muscle closes after food passes through the esophagus to the stomach. When the LES is weakened or abnormal, it does not close properly, and that allows food and stomach acid to go back up into the esophagus. The LES can be weakened by certain dietary substances, medicines, and medical conditions, including:  Tobacco use.  Pregnancy.  Having a hiatal hernia.  Heavy alcohol use.  Certain foods and beverages, such as coffee, chocolate, onions, and peppermint. RISK FACTORS This condition is more likely to develop in:  People who have an increased body weight.  People who have connective tissue disorders.  People who use NSAID medicines. SYMPTOMS Symptoms of this condition include:  Heartburn.  Difficult or painful swallowing.  The feeling of having a lump in the throat.  Abitter taste in the mouth.  Bad breath.  Having a large amount of saliva.  Having an upset or bloated stomach.  Belching.  Chest pain.  Shortness of breath or wheezing.  Ongoing (chronic) cough or a night-time cough.  Wearing away of tooth enamel.  Weight loss. Different conditions can cause chest pain. Make sure to see your health care provider if you experience chest  pain. DIAGNOSIS Your health care provider will take a medical history and perform a physical exam. To determine if you have mild or severe GERD, your health care provider may also monitor how you respond to treatment. You may also have other tests, including:  An endoscopy toexamine your stomach and esophagus with a small camera.  A test thatmeasures the acidity level in your esophagus.  A test thatmeasures how much pressure is on your esophagus.  A barium swallow or modified barium swallow to show the shape, size, and functioning of your esophagus. TREATMENT The goal of treatment is to help relieve your symptoms and to prevent complications. Treatment for this condition may vary depending on how severe your symptoms are. Your health care provider may recommend:  Changes to your diet.  Medicine.  Surgery. HOME CARE INSTRUCTIONS Diet  Follow a diet as recommended by your health care provider. This may involve avoiding foods and drinks such as:  Coffee and tea (with or without caffeine).  Drinks that containalcohol.  Energy drinks and sports drinks.  Carbonated drinks or sodas.  Chocolate and cocoa.  Peppermint and mint flavorings.  Garlic and onions.  Horseradish.  Spicy and acidic foods, including peppers, chili powder, curry powder, vinegar, hot sauces, and barbecue sauce.  Citrus fruit juices and citrus fruits, such as oranges, lemons, and limes.  Tomato-based foods, such as red sauce, chili, salsa, and pizza with red sauce.  Fried and fatty foods, such as donuts, french fries, potato chips, and high-fat dressings.  High-fat meats, such as hot dogs and fatty cuts of red and white meats, such as rib eye steak, sausage, ham, and bacon.  High-fat dairy items, such as whole milk, butter, and cream cheese.  Eat small, frequent meals instead of large meals.  Avoid drinking large amounts of liquid with your meals.  Avoid eating meals during the 2-3 hours before  bedtime.  Avoid lying down right after you eat.  Do not exercise right after you eat. General Instructions  Pay attention to any changes in your symptoms.  Take over-the-counter and prescription medicines only as told by your health care provider. Do not take aspirin, ibuprofen, or other NSAIDs unless your health care provider told you to do so.  Do not use any tobacco products, including cigarettes, chewing tobacco, and e-cigarettes. If you need help quitting, ask your health care provider.  Wear loose-fitting clothing. Do not wear anything tight around your waist that causes pressure on your abdomen.  Raise (elevate) the head of your bed 6 inches (15cm).  Try to reduce your stress, such as with yoga or meditation. If you need help reducing stress, ask your health care provider.  If you are overweight, reduce your weight to an amount that is healthy for you. Ask your health care provider for guidance about a safe weight loss goal.  Keep all follow-up visits as told by your health care provider. This is important. SEEK MEDICAL CARE IF:  You have new symptoms.  You have unexplained weight loss.  You have difficulty swallowing, or it hurts to swallow.  You have wheezing or a persistent cough.  Your symptoms do not improve with treatment.  You have a hoarse voice. SEEK IMMEDIATE MEDICAL CARE IF:  You have pain in your arms, neck, jaw, teeth, or back.  You feel sweaty, dizzy, or light-headed.  You have chest pain or shortness of breath.  You vomit and your vomit looks like blood or coffee grounds.  You faint.  Your stool is bloody or black.  You cannot swallow, drink, or eat.   This information is not intended to replace advice given to you by your health care provider. Make sure you discuss any questions you have with your health care provider.   Document Released: 10/06/2004 Document Revised: 09/17/2014 Document Reviewed: 04/23/2014 Elsevier Interactive Patient  Education [employer].

## 2014-11-19 NOTE — H&P (Signed)
Randall Sawyer is an 59 y.o. male.   Chief Complaint: Patient is here for EGD. HPI: Patient is 59 year old Caucasian male with multiple medical problems and COPD who presents with three-month history of postprandial bloating left upper quadrant abdominal pain and early satiety. In spite of his symptoms he states he has gained 40 pounds in the last 15 months. He is trying to quit cigarette smoking and now down to 8 cigarettes per day. He has tried OTC medications for flatulence but these have not helped. He denies nausea vomiting melena or rectal bleeding. He has 2 formed stools daily. He does not take OTC NSAIDs. He also denies frequent heartburn. Ultrasound in January 2016 was negative for cholelithiasis. Lab studies including CBC and comprehensive chemistry panel abdomen unremarkable. Last month he had abdominopelvic CT which again was unremarkable. He underwent Limited ultrasound yesterday and no ascites noted.  Past Medical History  Diagnosis Date  . Nephrolithiasis   . Tobacco abuse   . COPD (chronic obstructive pulmonary disease) ([place])   . Asthma   . Hypertension     History reviewed. No pertinent past surgical history.  Family History  Problem Relation Age of Onset  . COPD Father   . Heart disease Father   . Asthma Father   . Diabetes type II Mother   . Diabetes Sister    Social History:  reports that he has been smoking Cigarettes.  He started smoking about 49 years ago. He has been smoking about 1.25 packs per day. He has never used smokeless tobacco. He reports that he does not drink alcohol or use illicit drugs.  Allergies: No Known Allergies  Medications Prior to Admission  Medication Sig Dispense Refill  . budesonide-formoterol (SYMBICORT) 160-4.5 MCG/ACT inhaler Take 2 puffs first thing in am and then another 2 puffs about 12 hours later. 1 Inhaler 6  . furosemide (LASIX) 40 MG tablet Take 40 mg by mouth daily.     Marland Kitchen PROAIR HFA 108 (90 BASE) MCG/ACT inhaler INHALE 2  PUFFS BY MOUTH EVERY 4 HOURS AS NEEDED 8.5 g 5  . Umeclidinium Bromide (INCRUSE ELLIPTA) 62.5 MCG/INH AEPB Inhale 1 puff into the lungs daily. 30 each 5  . albuterol (PROVENTIL) (2.5 MG/3ML) 0.083% nebulizer solution Take 3 mLs (2.5 mg total) by nebulization every 6 (six) hours as needed for wheezing or shortness of breath. 150 mL 1    No results found for this or any previous visit (from the past 48 hour(s)). US Abdomen Limited  11/18/2014  CLINICAL DATA:  Abdominal distension question ascites, asthma, hypertension, COPD, kidney stones, smoking history EXAM: LIMITED ABDOMEN ULTRASOUND FOR ASCITES TECHNIQUE: Limited ultrasound survey for ascites was performed in all four abdominal quadrants. COMPARISON:  CT abdomen and pelvis 10/23/2014 FINDINGS: No ascites identified upon survey imaging of the 4 quadrants. Liver appears echogenic, corresponding to fatty infiltration on prior CT. IMPRESSION: No ascites identified. Fatty infiltration of liver. Electronically Signed   By: Lavonia Dana M.D.   On: 11/18/2014 08:16    ROS  Blood pressure 159/87, pulse 90, temperature 97.7 F (36.5 C), temperature source Oral, resp. rate 19, height '5\' 10"'$  (1.778 m), weight 196 lb (88.905 kg), SpO2 99 %. Physical Exam  Constitutional: He appears well-developed and well-nourished.  HENT:  Mouth/Throat: Oropharynx is clear and moist.  Eyes: Conjunctivae are normal. No scleral icterus.  Neck: No thyromegaly present.  Cardiovascular: Normal rate, regular rhythm and normal heart sounds.   No murmur heard. Respiratory: Effort normal.  Bilateral expiratory  rhonchi at both bases.  GI:  Abdomen is protuberant with mild tenderness in left upper quadrant but no organomegaly or masses.  Musculoskeletal:  1-2+ pitting edema involving both ankles.  Lymphadenopathy:    He has no cervical adenopathy.  Neurological: He is alert.  Skin: Skin is warm and dry.     Assessment/Plan Chronic postprandial bloating, left upper  quadrant abdominal pain and early satiety. Workup negative.  Diagnostic EGD.   REHMAN,NAJEEB U 11/19/2014, 2:12 PM

## 2014-11-22 ENCOUNTER — Other Ambulatory Visit (INDEPENDENT_AMBULATORY_CARE_PROVIDER_SITE_OTHER): Payer: Self-pay | Admitting: Internal Medicine

## 2014-11-22 ENCOUNTER — Telehealth (INDEPENDENT_AMBULATORY_CARE_PROVIDER_SITE_OTHER): Payer: Self-pay | Admitting: Internal Medicine

## 2014-11-22 MED ORDER — BIS SUBCIT-METRONID-TETRACYC 140-125-125 MG PO CAPS: 3.0000 | ORAL_CAPSULE | Freq: Three times a day (TID) | ORAL | Status: DC

## 2014-11-22 NOTE — Telephone Encounter (Signed)
Biopsy results reviewed with patient's daughter Crystal. Shows intestinal metaplasia with atypia and H. pylori infection. Will treat H. pylori gastritis. Office visit in 2 months. Will need a follow-up EGD following his visit.

## 2014-11-24 ENCOUNTER — Encounter (HOSPITAL_COMMUNITY): Payer: Self-pay | Admitting: Internal Medicine

## 2014-12-01 ENCOUNTER — Encounter: Payer: Self-pay | Admitting: Internal Medicine

## 2014-12-01 ENCOUNTER — Ambulatory Visit (INDEPENDENT_AMBULATORY_CARE_PROVIDER_SITE_OTHER): Payer: Medicaid Other | Admitting: Internal Medicine

## 2014-12-01 VITALS — BP 140/80 | HR 99 | Ht 70.0 in | Wt 196.0 lb

## 2014-12-01 DIAGNOSIS — J9612 Chronic respiratory failure with hypercapnia: Secondary | ICD-10-CM

## 2014-12-01 DIAGNOSIS — Z720 Tobacco use: Secondary | ICD-10-CM | POA: Diagnosis not present

## 2014-12-01 DIAGNOSIS — J449 Chronic obstructive pulmonary disease, unspecified: Secondary | ICD-10-CM

## 2014-12-01 DIAGNOSIS — F17210 Nicotine dependence, cigarettes, uncomplicated: Secondary | ICD-10-CM

## 2014-12-01 DIAGNOSIS — J9611 Chronic respiratory failure with hypoxia: Secondary | ICD-10-CM | POA: Diagnosis not present

## 2014-12-01 NOTE — Assessment & Plan Note (Signed)
>   3 m  I took an extended  opportunity with this patient to outline the consequences of continued cigarette use  in airway disorders based on all the data we have from the multiple national lung health studies (perfomed over decades at millions of dollars in cost)  indicating that smoking cessation, not choice of inhalers or physicians, is the most important aspect of care.   

## 2014-12-01 NOTE — Assessment & Plan Note (Signed)
hc03  36  05/16/14 corresponding to a pc02 above 55  - ono RA < 89% x 4 h 11 min > 09/12/2014  rec one liter per min hs and recheck = > done 9/141/6 with < 89% x 73m> no change rx   I had an extended discussion with the patient reviewing all relevant studies completed to date and  lasting 15 to 20 minutes of a 25 minute visit    Each maintenance medication was reviewed in detail including most importantly the difference between maintenance and prns and under what circumstances the prns are to be triggered using an action plan format that is not reflected in the computer generated alphabetically organized AVS.    Please see instructions for details which were reviewed in writing and the patient given a copy highlighting the part that I personally wrote and discussed at today's ov.

## 2014-12-01 NOTE — Assessment & Plan Note (Addendum)
-   HC03 36 05/16/14 - 05/27/2014 p extensive coaching HFA effectiveness =    90% > try symbicort/ incruse  - 06/11/14 Spriometry FEV1  0.57 (15%) ratio 34 1 h p last saba  - PFTs 07/23/2014   FEV1  0.84 (25%) ratio 44 p 16% improvement and dlco 65 p am symbicort  -  09/03/2014  Walked RA x 3 laps @ 185 ft each stopped due to  End of study, nl pace, no sob or desat    Despite very severe dz > Adequate control on present rx, reviewed > no change in rx needed    Key is stop smoking now (see sep a/p)

## 2014-12-01 NOTE — Patient Instructions (Signed)
The key is to stop smoking completely before smoking completely stops you - it really is a matter of life and breath!!!  No change in medications  Please schedule a follow up visit in 3 months but call sooner if needed

## 2014-12-01 NOTE — Progress Notes (Signed)
Subjective:    Patient ID: Randall Sawyer, male    DOB: [birth year]     MRN: [id number]    Brief patient profile:  89 yowm active smoker first noticed doe x around 2005 first on just albuterol then added spiriva then symbicort then stiolto which works the best so far and referred to pulmonary clinic 05/27/2014 by Dr Dennard Schaumann for copd eval and proved to have a GOLD IV severity  07/23/14 with hypercarbia.   History of Present Illness  05/27/2014 1st [facility] office visit/ Randall Sawyer   Chief Complaint  Patient presents with  . Pulmonary Consult    referred by Dr. Dennard Schaumann SOB x 1 year. C/o of SOB, fluid retention, prod cough with yellow thick mucus.  Wheezing late in the evenings. Left sided abdominal pain.   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                                      spiriva 2 puffs each am  Plan B = Back up - Only use your albuterol as a rescue medication  Plan C = crisis - Your albuterol nebulizer can be used up to every  4 hours with the goal of not needing at all eventually  Prednisone 10 mg take  4 each am x 2 days,   2 each am x 2 days,  1 each am x 2 days and stop      06/11/2014 f/u ov/Randall Sawyer re: copd still smoking   Chief Complaint  Patient presents with  . Follow-up    fluid retention not as bad; cough; chest tightness;states here for paperwork;  last used ventolin 1 hours - has not tried rechallenge/ sleeps ok  Just walking to mb makes him stop one  half way going both ways though uphill to it.  Not needing neb as much  No noct or early am exac  Has not worked since 05/27/14 rec Plan A =  Automatic =  Symbicort 160 Take 2 puffs first thing in am and then another 2 puffs about 12 hours later.                                      spiriva 2 puffs each am     Plan B = Back up - Only use your albuterol as a rescue medication Plan C = crisis - Your albuterol nebulizer can be used up to every  4 hours with the goal of not needing at all eventually  The key is to stop smoking completely before smoking completely stops you!       12/01/2014  f/u ov/Randall Sawyer re: GOLD IV copd/ still  smoking/ on symb/incruse  Chief Complaint  Patient presents with  . Follow-up    Pt states that his breathing is unchanged. He is using proair 1-2 x per day on average and has not used neb. He is still smoking- now down to 4 cigs per day.   No change Doe x MMRC2 = reports can't walk a nl pace on a flat grade s sob but still does [employer] shopping at slow pace, stopping frequently    No obvious other day to day or daytime variabilty or assoc excess/ purulent sputum production or cp or chest tightness, subjective wheeze overt sinus or hb symptoms. No unusual exp hx or h/o childhood pna/ asthma or knowledge of premature birth.  Sleeping ok without nocturnal  or early am exacerbation  of respiratory  c/o's or need for noct saba. Also denies any obvious fluctuation of symptoms with weather or environmental changes or other aggravating or alleviating factors except as outlined above   Current Medications, Allergies, Complete Past Medical History, Past Surgical History, Family History, and Social History were reviewed in [employer] record.  ROS  The following are not active complaints unless bolded sore throat, dysphagia, dental problems, itching, sneezing,  nasal congestion or excess/ purulent secretions, ear ache,   fever, chills, sweats, unintended wt  loss, pleuritic or exertional cp, hemoptysis,  orthopnea pnd or leg swelling bilateral , presyncope, palpitations, heartburn, abdominal pain, anorexia, nausea, vomiting, diarrhea  or change in bowel or urinary habits, change in stools or urine, dysuria,hematuria,  rash, arthralgias, visual complaints, headache, numbness weakness or ataxia or problems with walking or coordination,  change in mood/affect or memory.        Objective:   Physical Exam  amb wm nad gruff voice/ still min congested cough on fvc   06/11/2014           179 >  07/23/2014   184 > 09/03/2014 185 > 12/01/2014  196  Wt Readings from Last 3 Encounters:  05/27/14 180 lb (81.647 kg)  05/16/14 176 lb (79.833 kg)  05/09/14 180 lb (81.647 kg)    Vital signs reviewed   HEENT mild turbinate edema.  Oropharynx no thrush or excess pnd or cobblestoning.  No JVD or cervical adenopathy. Mild accessory muscle hypertrophy. Trachea midline, nl thryroid. Chest was hyperinflated by percussion with diminished breath sounds and moderate increased exp time with late exp bilat rhonchi.  Hoover sign positive at mid inspiration. Regular rate and rhythm without murmur gallop or rub or increase P2- 1+ pitting bilateral lower ext edema. Abd: no hsm, nl excursion. Ext warm without cyanosis or clubbing.    I personally reviewed images and agree with radiology impression as follows:  CXR:  05/16/14  Mild COPD without acute abnormality.                    Assessment & Plan:

## 2014-12-02 ENCOUNTER — Encounter (INDEPENDENT_AMBULATORY_CARE_PROVIDER_SITE_OTHER): Payer: Self-pay | Admitting: *Deleted

## 2015-01-26 ENCOUNTER — Other Ambulatory Visit (INDEPENDENT_AMBULATORY_CARE_PROVIDER_SITE_OTHER): Payer: Self-pay | Admitting: Internal Medicine

## 2015-01-26 ENCOUNTER — Encounter (INDEPENDENT_AMBULATORY_CARE_PROVIDER_SITE_OTHER): Payer: Self-pay | Admitting: *Deleted

## 2015-01-26 ENCOUNTER — Encounter (INDEPENDENT_AMBULATORY_CARE_PROVIDER_SITE_OTHER): Payer: Self-pay | Admitting: Internal Medicine

## 2015-01-26 ENCOUNTER — Ambulatory Visit (INDEPENDENT_AMBULATORY_CARE_PROVIDER_SITE_OTHER): Payer: BLUE CROSS/BLUE SHIELD | Admitting: Internal Medicine

## 2015-01-26 VITALS — BP 144/82 | HR 80 | Temp 98.1°F | Ht 70.0 in | Wt 188.4 lb

## 2015-01-26 DIAGNOSIS — B9681 Helicobacter pylori [H. pylori] as the cause of diseases classified elsewhere: Secondary | ICD-10-CM | POA: Diagnosis not present

## 2015-01-26 DIAGNOSIS — A048 Other specified bacterial intestinal infections: Secondary | ICD-10-CM

## 2015-01-26 NOTE — Patient Instructions (Signed)
EGD

## 2015-01-26 NOTE — Progress Notes (Signed)
Subjective:    Patient ID: Randall Sawyer, male    DOB: Oct 16, 1955, 60 y.o.   MRN: [id number]  HPIHere today for f/u. Underwent an EGD in November. Biopsy revealed intestinal metaplasia with atypia. He was postive for H. Pylori.  He is doing better. He finished the Pylera. He says he has less bloating. There is no abdominal pain.  Appetite is good. He eats one meal a day. He has lost 8 pounds since his visit in November. He still has fullness when he eats.  He usually has a BM x 2 a day. No melena or BRRB.  He feels 50% better      11/19/2014 EGD  Indications: Patient is 60-year-old Caucasian male who presents with over a year history of postprandial bloating described intractable preventing him from eating as well as left upper quadrant abdominal pain. Workup includes negative ultrasound for cholelithiasis unremarkable abdominal pelvic CT and recent ultrasound was negative for ascites. Comprehensive chemistry panel and CBC were unremarkable. He has not responded to OTC medications. He is undergoing diagnostic EGD.   Comment: I wonder if most of his bloating is secondary to weight gain. If gastric biopsies negative for H. pylori infection will proceed with solid-phase gastric emptying study. Will also consider dietary consultation in order to help him lose weight. Biopsy: Intestinal metaplasia with atypia.  Positive for H. pyloria. He will need follow-up EGD after H. pylori infection has been treated and eradicated. Patient needs office visit in 2 months. Report to PCP.   Review of Systems Past Medical History  Diagnosis Date  . Nephrolithiasis   . Tobacco abuse   . COPD (chronic obstructive pulmonary disease) ([place])   . Asthma   . Hypertension     Past Surgical History  Procedure Laterality Date  . Esophagogastroduodenoscopy N/A 11/19/2014    Procedure:  ESOPHAGOGASTRODUODENOSCOPY (EGD);  Surgeon: Rogene Houston, MD;  Location: [facility];  Service: Endoscopy;  Laterality: N/A;  2:15-rescheduled to 11/9 '@1'$ :68 Ann notified pt    No Known Allergies  Current Outpatient Prescriptions on File Prior to Visit  Medication Sig Dispense Refill  . albuterol (PROVENTIL) (2.5 MG/3ML) 0.083% nebulizer solution Take 3 mLs (2.5 mg total) by nebulization every 6 (six) hours as needed for wheezing or shortness of breath. 150 mL 1  . budesonide-formoterol (SYMBICORT) 160-4.5 MCG/ACT inhaler Take 2 puffs first thing in am and then another 2 puffs about 12 hours later. 1 Inhaler 6  . furosemide (LASIX) 40 MG tablet Take 40 mg by mouth daily.     . OXYGEN Inhale 1 L into the lungs at bedtime.    . pantoprazole (PROTONIX) 40 MG tablet Take 1 tablet (40 mg total) by mouth daily before breakfast. 30 tablet 3  . PROAIR HFA 108 (90 BASE) MCG/ACT inhaler INHALE 2 PUFFS BY MOUTH EVERY 4 HOURS AS NEEDED 8.5 g 5  . Umeclidinium Bromide (INCRUSE ELLIPTA) 62.5 MCG/INH AEPB Inhale 1 puff into the lungs daily. 30 each 5   No current facility-administered medications on file prior to visit.        Objective:   Physical Exam Blood pressure 144/82, pulse 80, temperature 98.1 F (36.7 C), height '5\' 10"'$  (1.778 m), weight 188 lb 6.4 oz (85.458 kg). Alert and oriented. Skin warm and dry. Oral mucosa is moist.   . Sclera anicteric, conjunctivae is pink. Thyroid not enlarged. No cervical lymphadenopathy. Lungs clear. Heart regular rate and rhythm.  Abdomen is soft. Bowel sounds are positive. No hepatomegaly. No  abdominal masses felt. No tenderness.  No edema to lower extremities.          Assessment & Plan:  H. Pylori +. Bloating. Needs repeat H. Pylori.  EGD.

## 2015-02-09 ENCOUNTER — Encounter: Payer: Self-pay | Admitting: Family Medicine

## 2015-02-09 ENCOUNTER — Other Ambulatory Visit: Payer: Self-pay | Admitting: Cardiology

## 2015-02-09 MED ORDER — FUROSEMIDE 40 MG PO TABS: ORAL_TABLET | ORAL | Status: DC

## 2015-02-12 ENCOUNTER — Encounter: Payer: Self-pay | Admitting: Cardiology

## 2015-02-16 ENCOUNTER — Ambulatory Visit: Payer: BLUE CROSS/BLUE SHIELD | Admitting: Adult Health

## 2015-02-26 ENCOUNTER — Encounter (HOSPITAL_COMMUNITY): Payer: Self-pay

## 2015-02-26 ENCOUNTER — Ambulatory Visit (HOSPITAL_COMMUNITY)
Admission: RE | Admit: 2015-02-26 | Discharge: 2015-02-26 | Disposition: A | Discharge status: Home / Self Care | Payer: BLUE CROSS/BLUE SHIELD | Source: Ambulatory Visit | Attending: Internal Medicine | Admitting: Internal Medicine

## 2015-02-26 ENCOUNTER — Encounter (HOSPITAL_COMMUNITY)
Admission: RE | Disposition: A | Discharge status: Home / Self Care | Payer: Self-pay | Source: Ambulatory Visit | Attending: Internal Medicine

## 2015-02-26 DIAGNOSIS — K449 Diaphragmatic hernia without obstruction or gangrene: Secondary | ICD-10-CM | POA: Insufficient documentation

## 2015-02-26 DIAGNOSIS — I10 Essential (primary) hypertension: Secondary | ICD-10-CM | POA: Diagnosis not present

## 2015-02-26 DIAGNOSIS — Z79899 Other long term (current) drug therapy: Secondary | ICD-10-CM | POA: Diagnosis not present

## 2015-02-26 DIAGNOSIS — Z7951 Long term (current) use of inhaled steroids: Secondary | ICD-10-CM | POA: Diagnosis not present

## 2015-02-26 DIAGNOSIS — J449 Chronic obstructive pulmonary disease, unspecified: Secondary | ICD-10-CM | POA: Insufficient documentation

## 2015-02-26 DIAGNOSIS — K319 Disease of stomach and duodenum, unspecified: Secondary | ICD-10-CM | POA: Diagnosis not present

## 2015-02-26 DIAGNOSIS — K2950 Unspecified chronic gastritis without bleeding: Secondary | ICD-10-CM | POA: Diagnosis not present

## 2015-02-26 DIAGNOSIS — J45909 Unspecified asthma, uncomplicated: Secondary | ICD-10-CM | POA: Insufficient documentation

## 2015-02-26 DIAGNOSIS — B9681 Helicobacter pylori [H. pylori] as the cause of diseases classified elsewhere: Secondary | ICD-10-CM | POA: Diagnosis not present

## 2015-02-26 DIAGNOSIS — A048 Other specified bacterial intestinal infections: Secondary | ICD-10-CM

## 2015-02-26 DIAGNOSIS — K317 Polyp of stomach and duodenum: Secondary | ICD-10-CM | POA: Diagnosis not present

## 2015-02-26 DIAGNOSIS — F17210 Nicotine dependence, cigarettes, uncomplicated: Secondary | ICD-10-CM | POA: Diagnosis not present

## 2015-02-26 HISTORY — PX: ESOPHAGOGASTRODUODENOSCOPY: SHX5428

## 2015-02-26 HISTORY — PX: BIOPSY: SHX5522

## 2015-02-26 SURGERY — EGD (ESOPHAGOGASTRODUODENOSCOPY)
Anesthesia: Moderate Sedation

## 2015-02-26 MED ORDER — BUTAMBEN-TETRACAINE-BENZOCAINE 2-2-14 % EX AERO
INHALATION_SPRAY | CUTANEOUS | Status: DC | PRN
  Administered 2015-02-26: 2 via TOPICAL

## 2015-02-26 MED ORDER — MIDAZOLAM HCL 5 MG/5ML IJ SOLN
INTRAMUSCULAR | Status: AC
  Filled 2015-02-26: qty 10

## 2015-02-26 MED ORDER — MEPERIDINE HCL 50 MG/ML IJ SOLN
INTRAMUSCULAR | Status: DC | PRN
  Administered 2015-02-26 (×2): 25 mg via INTRAVENOUS

## 2015-02-26 MED ORDER — MEPERIDINE HCL 50 MG/ML IJ SOLN
INTRAMUSCULAR | Status: AC
  Filled 2015-02-26: qty 1

## 2015-02-26 MED ORDER — MIDAZOLAM HCL 5 MG/5ML IJ SOLN
INTRAMUSCULAR | Status: DC | PRN
Start: 2015-02-26 — End: 2015-02-26
  Administered 2015-02-26: 3 mg via INTRAVENOUS
  Administered 2015-02-26: 2 mg via INTRAVENOUS

## 2015-02-26 MED ORDER — SODIUM CHLORIDE 0.9 % IV SOLN
INTRAVENOUS | Status: DC
  Administered 2015-02-26: 12:00:00 via INTRAVENOUS

## 2015-02-26 MED ORDER — STERILE WATER FOR IRRIGATION IR SOLN
Status: DC | PRN
  Administered 2015-02-26: 13:00:00

## 2015-02-26 NOTE — Discharge Instructions (Signed)
Resume usual medications and diet. °No driving for 24 hours. °Physician will call with biopsy results. ° °Gastrointestinal Endoscopy, Care After °Refer to this sheet in the next few weeks. These instructions provide you with information on caring for yourself after your procedure. Your caregiver may also give you more specific instructions. Your treatment has been planned according to current medical practices, but problems sometimes occur. Call your caregiver if you have any problems or questions after your procedure. °HOME CARE INSTRUCTIONS °· If you were given medicine to help you relax (sedative), do not drive, operate machinery, or sign important documents for 24 hours. °· Avoid alcohol and hot or warm beverages for the first 24 hours after the procedure. °· Only take over-the-counter or prescription medicines for pain, discomfort, or fever as directed by your caregiver. You may resume taking your normal medicines unless your caregiver tells you otherwise. Ask your caregiver when you may resume taking medicines that may cause bleeding, such as aspirin, clopidogrel, or warfarin. °· You may return to your normal diet and activities on the day after your procedure, or as directed by your caregiver. Walking may help to reduce any bloated feeling in your abdomen. °· Drink enough fluids to keep your urine clear or pale yellow. °· You may gargle with salt water if you have a sore throat. °SEEK IMMEDIATE MEDICAL CARE IF: °· You have severe nausea or vomiting. °· You have severe abdominal pain, abdominal cramps that last longer than 6 hours, or abdominal swelling (distention). °· You have severe shoulder or back pain. °· You have trouble swallowing. °· You have shortness of breath, your breathing is shallow, or you are breathing faster than normal. °· You have a fever or a rapid heartbeat. °· You vomit blood or material that looks like coffee grounds. °· You have bloody, black, or tarry stools. °MAKE SURE  YOU: °· Understand these instructions. °· Will watch your condition. °· Will get help right away if you are not doing well or get worse. °  °This information is not intended to replace advice given to you by your health care provider. Make sure you discuss any questions you have with your health care provider. °  °Document Released: 08/11/2003 Document Revised: 01/17/2014 Document Reviewed: 03/29/2011 °Elsevier Interactive Patient Education ©2016 [employer] ° °

## 2015-02-26 NOTE — Op Note (Addendum)
EGD PROCEDURE REPORT  PATIENT:  Randall Sawyer  MR#:  [id number] Birthdate:  1955-10-13, 60 y.o., male Endoscopist:  Dr. Rogene Houston, MD Referred By:  Dr.  Odette Fraction, MD  Procedure Date: 02/26/2015  Procedure:   EGD  Indications:   Patient is 60 year old Caucasian male who underwent diagnostic EGD on 11/19/2014 for early satiety postprandial fullness  and epigastric pain. EGD revealed nodular antral gastritis. Biopsy confirmed H pylori infection and also revealed changes of intestinal metaplasia with atypia. Patient is returning for follow-up EGD. Patient  He does not feel much better. He has lost 8 pounds in the last 3 months.            Informed Consent:  The risks, benefits, alternatives & imponderables which include, but are not limited to, bleeding, infection, perforation, drug reaction and potential missed lesion have been reviewed.  The potential for biopsy, lesion removal, esophageal dilation, etc. have also been discussed.  Questions have been answered.  All parties agreeable.  Please see history & physical in medical record for more information.  Medications:  Demerol 50 mg IV Versed 5 mg IV Cetacaine spray topically for oropharyngeal anesthesia  First dose administered at 1239 Last dose administered at 1249  Description of procedure:  The endoscope was introduced through the mouth and advanced to the second portion of the duodenum without difficulty or limitations. The mucosal surfaces were surveyed very carefully during advancement of the scope and upon withdrawal.  Findings:  Esophagus:   Mucosa of the esophagus was normal. GE junction was unremarkable. GEJ:  41 cm Hiatus:  43 cm Stomach:  Stomach was empty and distended very well with insufflation. Folds in the proximal stomach were normal. Examination of mucosa at gastric body was normal. Antral mucosa revealed nodularity with the focal erythema in the prepyloric nodule /polyp.   No antral erosions noted on  today's exam. Pyloric channel was patent. Angularis fundus and cardia were unremarkable. Duodenum:   Normal bulbar and post bulbar mucosa.  Therapeutic/Diagnostic Maneuvers Performed:   Multiple biopsies taken from nodular antral mucosa and also from prepyloric polyp/ nodule.  Complications:   none  EBL: minimal  Impression: Small sliding hiatal hernia without evidence of erosive esophagitis. Fine nodularity to antral mucosa with single prepyloric polyp. Gastric erosions have healed since last EGD of 11/29/2014. Biopsies taken from antral mucosa and also from prepyloric polyp and submitted separately.  Recommendations:  Standard instructions given. I will be contacting patient with biopsy results and further recommendations.  Osie Andringa U  02/26/2015  1:00 PM  CC: Dr. Jenna Luo TOM, MD & Dr. Rayne Du ref. provider found

## 2015-02-26 NOTE — H&P (Signed)
Randall Sawyer is an 60 y.o. male.   Chief Complaint:  Patient is here for EGD. HPI:  Patient is 60 year old Caucasian male who underwent esophagogastroduodenoscopy on 11/16/2014 because of postprandial nausea early satiety and bloating. EGD revealed  Intestinal dysplasia with atypia gastric antrum. He was also found to have H. Pylori gastritis and has been treated. He feels some better but not very like to be.  He has lost incontinence in the last 3 months.  Past Medical History  Diagnosis Date  . Nephrolithiasis   . Tobacco abuse   . COPD (chronic obstructive pulmonary disease) ([place])   . Asthma   . Hypertension     Past Surgical History  Procedure Laterality Date  . Esophagogastroduodenoscopy N/A 11/19/2014    Procedure: ESOPHAGOGASTRODUODENOSCOPY (EGD);  Surgeon: Rogene Houston, MD;  [facility];  Service: Endoscopy;  Laterality: N/A;  2:15-rescheduled to 11/9 '@1'$ :96 Ann notified pt    Family History  Problem Relation Age of Onset  . COPD Father   . Heart disease Father   . Asthma Father   . Diabetes type II Mother   . Diabetes Sister    Social History:  reports that he has been smoking Cigarettes.  He started smoking about 50 years ago. He has been smoking about 1.25 packs per day. He has never used smokeless tobacco. He reports that he does not drink alcohol or use illicit drugs.  Allergies: No Known Allergies  Medications Prior to Admission  Medication Sig Dispense Refill  . albuterol (PROVENTIL) (2.5 MG/3ML) 0.083% nebulizer solution Take 3 mLs (2.5 mg total) by nebulization every 6 (six) hours as needed for wheezing or shortness of breath. 150 mL 1  . budesonide-formoterol (SYMBICORT) 160-4.5 MCG/ACT inhaler Take 2 puffs first thing in am and then another 2 puffs about 12 hours later. 1 Inhaler 6  . furosemide (LASIX) 40 MG tablet TAKE 1 TABLET BY MOUTH DAILY 30 tablet 0  . OXYGEN Inhale 1 L into the lungs at bedtime.    . pantoprazole (PROTONIX) 40 MG tablet  Take 1 tablet (40 mg total) by mouth daily before breakfast. 30 tablet 3  . PROAIR HFA 108 (90 BASE) MCG/ACT inhaler INHALE 2 PUFFS BY MOUTH EVERY 4 HOURS AS NEEDED 8.5 g 5  . Umeclidinium Bromide (INCRUSE ELLIPTA) 62.5 MCG/INH AEPB Inhale 1 puff into the lungs daily. 30 each 5    No results found for this or any previous visit (from the past 48 hour(s)). No results found.  ROS  Blood pressure 158/80, pulse 88, temperature 97.8 F (36.6 C), temperature source Oral, resp. rate 18, height '5\' 10"'$  (1.778 m), weight 188 lb (85.276 kg), SpO2 99 %. Physical Exam  Constitutional: He appears well-developed and well-nourished.  HENT:  Mouth/Throat: Oropharynx is clear and moist.  Eyes: Conjunctivae are normal. No scleral icterus.  Neck: No thyromegaly present.  Cardiovascular: Normal rate, regular rhythm and normal heart sounds.   No murmur heard. Respiratory: Effort normal and breath sounds normal.  GI: Soft. He exhibits no distension and no mass. There is no tenderness.  Lymphadenopathy:    He has no cervical adenopathy.     Assessment/Plan  History of gastric intestinal metaplasia with atypia.  Persistent symptoms of dyspepsia.  EGD with biopsy.  Rogene Houston, MD 02/26/2015, 12:33 PM

## 2015-03-03 ENCOUNTER — Encounter (HOSPITAL_COMMUNITY): Payer: Self-pay | Admitting: Internal Medicine

## 2015-03-03 ENCOUNTER — Ambulatory Visit (INDEPENDENT_AMBULATORY_CARE_PROVIDER_SITE_OTHER): Payer: BLUE CROSS/BLUE SHIELD | Admitting: Internal Medicine

## 2015-03-03 VITALS — BP 122/84 | HR 90 | Ht 70.0 in | Wt 189.0 lb

## 2015-03-03 DIAGNOSIS — J9611 Chronic respiratory failure with hypoxia: Secondary | ICD-10-CM | POA: Diagnosis not present

## 2015-03-03 DIAGNOSIS — J9612 Chronic respiratory failure with hypercapnia: Secondary | ICD-10-CM | POA: Diagnosis not present

## 2015-03-03 DIAGNOSIS — F17210 Nicotine dependence, cigarettes, uncomplicated: Secondary | ICD-10-CM | POA: Diagnosis not present

## 2015-03-03 DIAGNOSIS — J449 Chronic obstructive pulmonary disease, unspecified: Secondary | ICD-10-CM | POA: Diagnosis not present

## 2015-03-03 MED ORDER — GLYCOPYRROLATE-FORMOTEROL 9-4.8 MCG/ACT IN AERO: 2.0000 | INHALATION_SPRAY | Freq: Two times a day (BID) | RESPIRATORY_TRACT | Status: DC

## 2015-03-03 NOTE — Progress Notes (Signed)
Subjective:    Patient ID: Randall Sawyer, male    DOB: May 10, 1955     MRN: [id number]    Brief patient profile:  25 yowm active smoker first noticed doe x around 2005 first on just albuterol then added spiriva then symbicort then stiolto which works the best so far and referred to pulmonary clinic 05/27/2014 by Dr Dennard Schaumann for copd eval and proved to have a GOLD IV severity  07/23/14 with hypercarbia.   History of Present Illness  05/27/2014 1st [facility] office visit/ Randall Sawyer   Chief Complaint  Patient presents with  . Pulmonary Consult    referred by Dr. Dennard Schaumann SOB x 1 year. C/o of SOB, fluid retention, prod cough with yellow thick mucus.  Wheezing late in the evenings. Left sided abdominal pain.   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                                      spiriva 2 puffs each am  Plan B = Back up - Only use your albuterol as a rescue medication  Plan C = crisis - Your albuterol nebulizer can be used up to every  4 hours with the goal of not needing at all eventually  Prednisone 10 mg take  4 each am x 2 days,   2 each am x 2 days,  1 each am x 2 days and stop      06/11/2014 f/u ov/Randall Sawyer re: copd still smoking   Chief Complaint  Patient presents with  . Follow-up    fluid retention not as bad; cough; chest tightness;states here for paperwork;  last used ventolin 1 hours - has not tried rechallenge/ sleeps ok  Just walking to mb makes him stop one  half way going both ways though uphill to it.  Not needing neb as much  No noct or early am exac  Has not worked since 05/27/14 rec Plan A =  Automatic =  Symbicort 160 Take 2 puffs first thing in am and then another 2 puffs about 12 hours later.                                      spiriva 2 puffs each am     Plan B = Back up - Only use your albuterol as a rescue medication Plan C = crisis - Your albuterol nebulizer can be used up to every  4 hours with the goal of not needing at all eventually  The key is to stop smoking completely before smoking completely stops you!       12/01/2014  f/u ov/Randall Sawyer re: GOLD IV copd/ still  smoking/ on symb/incruse  Chief Complaint  Patient presents with  . Follow-up    Pt states that his breathing is unchanged. He is using proair 1-2 x per day on average and has not used neb. He is still smoking- now down to 4 cigs per day.   No change Doe x MMRC2 = reports can't walk a nl pace on a flat grade s sob but still does [employer] shopping at slow pace, stopping frequently  rec No change rx    03/03/2015  f/u ov/Randall Sawyer re: copd IV/ still smoking/ maint on symb/incruse / 02 1lpm Chief Complaint  Patient presents with  . Follow-up    Breathing is unchanged. He is still using proair 1-2 x daily and has not needed neb. He states "down to 6 cigs a day".       No obvious other day to day or daytime variabilty or assoc excess/ purulent sputum production or cp or chest tightness, subjective wheeze overt sinus or hb symptoms. No unusual exp hx or h/o childhood pna/ asthma or knowledge of premature birth.  Sleeping ok without nocturnal  or early am exacerbation  of respiratory  c/o's or need for noct saba. Also denies any obvious fluctuation of symptoms with weather or environmental changes or other aggravating or alleviating factors except as outlined above   Current Medications, Allergies, Complete Past Medical History, Past Surgical History, Family History, and  Social History were reviewed in [employer] record.  ROS  The following are not active complaints unless bolded sore throat, dysphagia, dental problems, itching, sneezing,  nasal congestion or excess/ purulent secretions, ear ache,   fever, chills, sweats, unintended wt loss, pleuritic or exertional cp, hemoptysis,  orthopnea pnd or leg swelling bilateral better  , presyncope, palpitations, heartburn, abdominal pain, anorexia, nausea, vomiting, diarrhea  or change in bowel or urinary habits, change in stools or urine, dysuria,hematuria,  rash, arthralgias, visual complaints, headache, numbness weakness or ataxia or problems with walking or coordination,  change in mood/affect or memory.        Objective:   Physical Exam  amb wm nad gruff voice/ still rattling  congested cough on fvc   06/11/2014           179 >  07/23/2014   184 > 09/03/2014 185 > 12/01/2014  196 > 03/03/2015  190     05/27/14 180 lb (81.647 kg)  05/16/14 176 lb (79.833 kg)  05/09/14 180 lb (81.647 kg)    Vital signs reviewed   HEENT mild turbinate edema.  Oropharynx no thrush or excess pnd or cobblestoning.  No JVD or cervical adenopathy. Mild accessory muscle hypertrophy. Trachea midline, nl thryroid. Chest was slt barrel shaped and hyperinflated by percussion with diminished breath sounds and moderate increased exp time with late exp bilat rhonchi.  Hoover sign positive at mid inspiration. Regular rate and rhythm without murmur gallop or rub or increase P2-trace pitting bilateral lower ext edema. Abd: no hsm, nl excursion. Ext warm without cyanosis or clubbing.    I personally reviewed images and agree with radiology impression as follows:  CXR:  05/16/14  Mild COPD without acute abnormality.                    Assessment & Plan:

## 2015-03-03 NOTE — Patient Instructions (Signed)
Try BEVESPI  Take 2 puffs first thing in am and then another 2 puffs about 12 hours later and stop symbicort/incruse   Try E- cigarettes as one way bridge off all tobacco products   Please schedule a follow up visit in 6 months but call sooner if needed

## 2015-03-03 NOTE — Assessment & Plan Note (Signed)
-   HC03 36 05/16/14 - 05/27/2014 p extensive coaching HFA effectiveness =    90% > try symbicort/ incruse  - 06/11/14 Spriometry FEV1  0.57 (15%) ratio 34 1 h p last saba  - PFTs 07/23/2014   FEV1  0.84 (25%) ratio 44 p 16% improvement and dlco 65 p am symbicort  -  09/03/2014  Walked RA x 3 laps @ 185 ft each stopped due to  End of study, nl pace, no sob or desat    Very severe dz but relatively well compensated though having to pay 2 copays so good candidate for BEVESPI  - The proper method of use, as well as anticipated side effects, of a metered-dose inhaler are discussed and demonstrated to the patient. Improved effectiveness after extensive coaching during this visit to a level of approximately 90 % from a baseline of 75 %   I had an extended discussion with the patient reviewing all relevant studies completed to date and  lasting 15 to 20 minutes of a 25 minute visit   Formulary restrictions will be an ongoing challenge for the forseable future and I would be happy to pick an alternative if the pt will first  provide me a list of them but pt  will need to return here for training for any new device that is required eg dpi vs hfa vs respimat.    In meantime we can always provide samples so the patient never runs out of any needed respiratory medications.    Each maintenance medication was reviewed in detail including most importantly the difference between maintenance and prns and under what circumstances the prns are to be triggered using an action plan format that is not reflected in the computer generated alphabetically organized AVS.    Please see instructions for details which were reviewed in writing and the patient given a copy highlighting the part that I personally wrote and discussed at today's ov.

## 2015-03-03 NOTE — Assessment & Plan Note (Signed)
hc03  36  05/16/14 corresponding to a pc02 above 55  - ono RA < 89% x 4 h 11 min > 09/12/2014  rec one liter per min hs and recheck = > done 9/141/6 with < 89% x 42m> no change rx   Adequate control on present rx, reviewed > no change in rx needed

## 2015-03-03 NOTE — Assessment & Plan Note (Signed)
>   3 min  Discussed the risks and costs (both direct and indirect)  of smoking relative to the benefits of quitting but patient unwilling to commit at this point to a specific quit date.     Suggested e cigs as one way bridge off all tobacco products

## 2015-03-15 ENCOUNTER — Other Ambulatory Visit: Payer: Self-pay | Admitting: Cardiology

## 2015-03-18 ENCOUNTER — Encounter (INDEPENDENT_AMBULATORY_CARE_PROVIDER_SITE_OTHER): Payer: Self-pay | Admitting: *Deleted

## 2015-04-12 ENCOUNTER — Other Ambulatory Visit (INDEPENDENT_AMBULATORY_CARE_PROVIDER_SITE_OTHER): Payer: Self-pay | Admitting: Internal Medicine

## 2015-05-26 ENCOUNTER — Ambulatory Visit (INDEPENDENT_AMBULATORY_CARE_PROVIDER_SITE_OTHER): Payer: BLUE CROSS/BLUE SHIELD | Admitting: Internal Medicine

## 2015-05-27 ENCOUNTER — Ambulatory Visit (INDEPENDENT_AMBULATORY_CARE_PROVIDER_SITE_OTHER): Payer: BLUE CROSS/BLUE SHIELD | Admitting: Internal Medicine

## 2015-05-27 ENCOUNTER — Encounter (INDEPENDENT_AMBULATORY_CARE_PROVIDER_SITE_OTHER): Payer: Self-pay | Admitting: Internal Medicine

## 2015-05-27 VITALS — BP 118/54 | HR 72 | Temp 98.0°F | Ht 70.0 in | Wt 189.8 lb

## 2015-05-27 DIAGNOSIS — K219 Gastro-esophageal reflux disease without esophagitis: Secondary | ICD-10-CM

## 2015-05-27 DIAGNOSIS — R140 Abdominal distension (gaseous): Secondary | ICD-10-CM

## 2015-05-27 NOTE — Progress Notes (Signed)
Subjective:    Patient ID: Randall Sawyer, male    DOB: [birth year], 60 y.o.   MRN: [id number]  HPI Here today for f/u after undergoing an EGD in February. Hx of postprandial fullness and epigastric pain. See EGD below.  He tells me he is doing good.  He still has some bloating after eating. There is not epigastric pain. Takes Protonix for GERD.  His appetite is good. No weight loss. Has a BM 2-3 times a day.  Gastric empty study offered to patient but he would like to wait.  Quit smoking in March of this year.      02/26/2015   EGD  Indications:   Patient is 60 year old Caucasian male who underwent diagnostic EGD on 11/19/2014 for early satiety postprandial fullness  and epigastric pain. EGD revealed nodular antral gastritis. Biopsy confirmed H pylori infection and also revealed changes of intestinal metaplasia with atypia. Patient is returning for follow-up EGD. Patient  He does not feel much better. He has lost 8 pounds in the last 3 months.                                                                                                                    Impression: Small sliding hiatal hernia without evidence of erosive esophagitis. Fine nodularity to antral mucosa with single prepyloric polyp. Gastric erosions have healed since last EGD of 11/29/2014. Biopsies taken from antral mucosa and also from prepyloric polyp and submitted separately.    Biopsy results reviewed with patient. Reactive gastropathy and polyps are hyperplastic.Marland Kitchen He remains with postprandial bloating. He eats one or two meals a day. He wants to hold off further evaluation until office visit.     11/19/2014   EGD  Indications:  Patient is 60-year-old Caucasian male who presents with over a year history of postprandial bloating described intractable preventing him from eating as well as left upper quadrant abdominal pain. Workup includes negative ultrasound for cholelithiasis unremarkable abdominal pelvic CT and  recent ultrasound was negative for ascites.            Comprehensive chemistry panel and CBC were unremarkable. He has not responded to OTC medications. He is undergoing diagnostic EGD.         Biopsy revealed intestinal metaplasia with atypia. He was postive for H. Pylori.   Treated with  Pylera.                                                                                                11/18/2014 US abdomen: bloating, abdominal distention IMPRESSION: No ascites identified.  Fatty infiltration of liver.   Review  of Systems Past Medical History  Diagnosis Date  . Nephrolithiasis   . Tobacco abuse   . COPD (chronic obstructive pulmonary disease) ([place])   . Asthma   . Hypertension     Past Surgical History  Procedure Laterality Date  . Esophagogastroduodenoscopy N/A 11/19/2014    Procedure: ESOPHAGOGASTRODUODENOSCOPY (EGD);  Surgeon: Rogene Houston, MD;  [facility];  Service: Endoscopy;  Laterality: N/A;  2:15-rescheduled to 11/9 '@1'$ :12 Ann notified pt  . Esophagogastroduodenoscopy N/A 02/26/2015    Procedure: ESOPHAGOGASTRODUODENOSCOPY (EGD);  Surgeon: Rogene Houston, MD;  [facility];  Service: Endoscopy;  Laterality: N/A;  1225  . Biopsy N/A 02/26/2015    Procedure: BIOPSY;  Surgeon: Rogene Houston, MD;  [facility]: [facility];  Service: Endoscopy;  Laterality: N/A;  Gastric biopsies    No Known Allergies  Current Outpatient Prescriptions on File Prior to Visit  Medication Sig Dispense Refill  . albuterol (PROVENTIL) (2.5 MG/3ML) 0.083% nebulizer solution Take 3 mLs (2.5 mg total) by nebulization every 6 (six) hours as needed for wheezing or shortness of breath. 150 mL 1  . furosemide (LASIX) 40 MG tablet TAKE 1 TABLET BY MOUTH DAILY 30 tablet 0  . furosemide (LASIX) 40 MG tablet TAKE 1 1/2 TABLET BY MOUTH DAILY 45 tablet 3  . Glycopyrrolate-Formoterol (BEVESPI) 9-4.8 MCG/ACT AERO Inhale 2 puffs into the lungs 2 (two) times daily. Take 2 puffs first  thing in am and then another 2 puffs about 12 hours later. 1 Inhaler 11  . OXYGEN Inhale 1 L into the lungs at bedtime.    . pantoprazole (PROTONIX) 40 MG tablet TAKE 1 TABLET(40 MG) BY MOUTH DAILY BEFORE BREAKFAST 30 tablet 5  . PROAIR HFA 108 (90 BASE) MCG/ACT inhaler INHALE 2 PUFFS BY MOUTH EVERY 4 HOURS AS NEEDED 8.5 g 5   No current facility-administered medications on file prior to visit.        Objective:   Physical ExamBlood pressure 118/54, pulse 72, temperature 98 F (36.7 C), height '5\' 10"'$  (1.778 m), weight 189 lb 12.8 oz (86.093 kg).  Alert and oriented. Skin warm and dry. Oral mucosa is moist.   . Sclera anicteric, conjunctivae is pink. Thyroid not enlarged. No cervical lymphadenopathy. Bilateral soft wheezes. Heart regular rate and rhythm.  Abdomen is soft. Bowel sounds are positive.Liver feels enlarged.  No abdominal masses felt. No tenderness.  No edema to lower extremities.         Assessment & Plan:  GERD. Continue the Protonix. OV in 6 months. If any problems, call our office.

## 2015-05-27 NOTE — Patient Instructions (Signed)
OV in 6 months. 

## 2015-05-28 ENCOUNTER — Other Ambulatory Visit: Payer: Self-pay | Admitting: Family Medicine

## 2015-05-28 NOTE — Telephone Encounter (Signed)
Refill appropriate and filled per protocol. 

## 2015-06-10 ENCOUNTER — Other Ambulatory Visit: Payer: Self-pay | Admitting: Cardiology

## 2015-06-18 ENCOUNTER — Other Ambulatory Visit: Payer: Self-pay | Admitting: Family Medicine

## 2015-06-18 NOTE — Telephone Encounter (Signed)
Refill appropriate and filled per protocol. 

## 2015-08-27 ENCOUNTER — Other Ambulatory Visit: Payer: Self-pay | Admitting: Cardiology

## 2015-08-31 ENCOUNTER — Encounter: Payer: Self-pay | Admitting: Internal Medicine

## 2015-08-31 ENCOUNTER — Ambulatory Visit (INDEPENDENT_AMBULATORY_CARE_PROVIDER_SITE_OTHER): Payer: BLUE CROSS/BLUE SHIELD | Admitting: Internal Medicine

## 2015-08-31 ENCOUNTER — Ambulatory Visit (INDEPENDENT_AMBULATORY_CARE_PROVIDER_SITE_OTHER)
Admission: RE | Admit: 2015-08-31 | Discharge: 2015-08-31 | Disposition: A | Discharge status: Home / Self Care | Payer: BLUE CROSS/BLUE SHIELD | Source: Ambulatory Visit | Attending: Internal Medicine | Admitting: Internal Medicine

## 2015-08-31 ENCOUNTER — Other Ambulatory Visit (INDEPENDENT_AMBULATORY_CARE_PROVIDER_SITE_OTHER): Payer: BLUE CROSS/BLUE SHIELD

## 2015-08-31 VITALS — BP 124/70 | HR 90 | Ht 70.0 in | Wt 192.0 lb

## 2015-08-31 DIAGNOSIS — J449 Chronic obstructive pulmonary disease, unspecified: Secondary | ICD-10-CM | POA: Diagnosis not present

## 2015-08-31 DIAGNOSIS — R0600 Dyspnea, unspecified: Secondary | ICD-10-CM

## 2015-08-31 DIAGNOSIS — J9611 Chronic respiratory failure with hypoxia: Secondary | ICD-10-CM | POA: Diagnosis not present

## 2015-08-31 DIAGNOSIS — F17210 Nicotine dependence, cigarettes, uncomplicated: Secondary | ICD-10-CM | POA: Diagnosis not present

## 2015-08-31 DIAGNOSIS — J9612 Chronic respiratory failure with hypercapnia: Secondary | ICD-10-CM

## 2015-08-31 LAB — BASIC METABOLIC PANEL
BUN: 12 mg/dL (ref 6–23)
CHLORIDE: 95 meq/L — AB (ref 96–112)
CO2: 36 meq/L — AB (ref 19–32)
CREATININE: 1.04 mg/dL (ref 0.40–1.50)
Calcium: 8.9 mg/dL (ref 8.4–10.5)
GFR: 76.99 mL/min (ref >60.00)
Glucose, Bld: 100 mg/dL — ABNORMAL HIGH (ref 70–99)
POTASSIUM: 4.5 meq/L (ref 3.5–5.1)
Sodium: 137 mEq/L (ref 135–145)

## 2015-08-31 LAB — CBC WITH DIFFERENTIAL/PLATELET
Basophils Absolute: 0.1 10*3/uL (ref 0.0–0.1)
Basophils Relative: 0.7 % (ref 0.0–3.0)
EOS PCT: 5.7 % — AB (ref 0.0–5.0)
Eosinophils Absolute: 0.5 10*3/uL (ref 0.0–0.7)
HEMATOCRIT: 45.9 % (ref 39.0–52.0)
HEMOGLOBIN: 15.8 g/dL (ref 13.0–17.0)
LYMPHS PCT: 15 % (ref 12.0–46.0)
Lymphs Abs: 1.4 10*3/uL (ref 0.7–4.0)
MCHC: 34.3 g/dL (ref 30.0–36.0)
MCV: 95.8 fl (ref 78.0–100.0)
MONOS PCT: 8.6 % (ref 3.0–12.0)
Monocytes Absolute: 0.8 10*3/uL (ref 0.1–1.0)
Neutro Abs: 6.4 10*3/uL (ref 1.4–7.7)
Neutrophils Relative %: 70 % (ref 43.0–77.0)
Platelets: 250 10*3/uL (ref 150.0–400.0)
RBC: 4.79 Mil/uL (ref 4.22–5.81)
RDW: 16.3 % — ABNORMAL HIGH (ref 11.5–15.5)
WBC: 9.2 10*3/uL (ref 4.0–10.5)

## 2015-08-31 LAB — HEPATIC FUNCTION PANEL
ALBUMIN: 3.9 g/dL (ref 3.5–5.2)
ALK PHOS: 111 U/L (ref 39–117)
ALT: 17 U/L (ref 0–53)
AST: 17 U/L (ref 0–37)
Bilirubin, Direct: 0.1 mg/dL (ref 0.0–0.3)
Total Bilirubin: 0.4 mg/dL (ref 0.2–1.2)
Total Protein: 7.3 g/dL (ref 6.0–8.3)

## 2015-08-31 LAB — TSH: TSH: 1.05 u[IU]/mL (ref 0.35–4.50)

## 2015-08-31 LAB — BRAIN NATRIURETIC PEPTIDE: Pro B Natriuretic peptide (BNP): 25 pg/mL (ref 0.0–100.0)

## 2015-08-31 MED ORDER — FUROSEMIDE 40 MG PO TABS: ORAL_TABLET | ORAL | 11 refills | Status: DC

## 2015-08-31 MED ORDER — SPIRONOLACTONE-HCTZ 25-25 MG PO TABS: 1.0000 | ORAL_TABLET | Freq: Every day | ORAL | 11 refills | Status: DC

## 2015-08-31 NOTE — Progress Notes (Signed)
Subjective:    Patient ID: Randall Sawyer, male    DOB: 06-25-1955     MRN: [id number]    Brief patient profile:  2 yowm active smoker first noticed doe x around 2005 first on just albuterol then added spiriva then symbicort then stiolto which works the best so far and referred to pulmonary clinic 05/27/2014 by Dr Randall Sawyer for copd eval and proved to have a GOLD IV severity  07/23/14 with hypercarbia.   History of Present Illness  05/27/2014 1st [facility] office visit/ Randall Sawyer   Chief Complaint  Patient presents with  . Pulmonary Consult    referred by Dr. Dennard Sawyer SOB x 1 year. C/o of SOB, fluid retention, prod cough with yellow thick mucus.  Wheezing late in the evenings. Left sided abdominal pain.   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                                      spiriva 2 puffs each am  Plan B = Back up - Only use your albuterol as a rescue medication  Plan C = crisis - Your albuterol nebulizer can be used up to every  4 hours with the goal of not needing at all eventually  Prednisone 10 mg take  4 each am x 2 days,   2 each am x 2 days,  1 each am x 2 days and stop      06/11/2014 f/u ov/Randall Sawyer re: copd still smoking   Chief Complaint  Patient presents with  . Follow-up    fluid retention not as bad; cough; chest tightness;states here for paperwork;  last used ventolin 1 hours - has not tried rechallenge/ sleeps ok  Just walking to mb makes him stop one  half way going both ways though uphill to it.  Not needing neb as much  No noct or early am exac  Has not worked since 05/27/14 rec Plan A =  Automatic =  Symbicort 160 Take 2 puffs first thing in am and then another 2 puffs about 12 hours later.                                      spiriva 2 puffs each am     Plan B = Back up - Only use your albuterol as a rescue medication Plan C = crisis - Your albuterol nebulizer can be used up to every  4 hours with the goal of not needing at all eventually  The key is to stop smoking completely before smoking completely stops you!       12/01/2014  f/u ov/Randall Sawyer re: GOLD IV copd/ still  smoking/ on symb/incruse  Chief Complaint  Patient presents with  . Follow-up    Pt states that his breathing is unchanged. He is using proair 1-2 x per day on average and has not used neb. He is still smoking- now down to 4 cigs per day.   No change Doe x MMRC2 = reports can't walk a nl pace on a flat grade s sob but still does [employer] shopping at slow pace, stopping frequently  rec No change rx    03/03/2015  f/u ov/Randall Sawyer re: copd IV/ still smoking/ maint on symb/incruse / 02 1lpm Chief Complaint  Patient presents with  . Follow-up    Breathing is unchanged. He is still using proair 1-2 x daily and has not needed neb. He states "down to 6 cigs a day".   rec Try BEVESPI  Take 2 puffs first thing in am and then another 2 puffs about 12 hours later and stop symbicort/incruse  Try E- cigarettes as one way bridge off all tobacco products     08/31/2015  f/u ov/Randall Sawyer re: copd gold iv/ bevespi 2bid / 1lpm hs but still smoking  Chief Complaint  Patient presents with  . Follow-up    Breathing is unchanged. He is using proair 2-3 x per day "because it's been so hot". He has not used neb.   mb and back s stopping slow pace/ no 02 Rare noct cough/congestion maybe once a week      No obvious other day to day or daytime variabilty or assoc excess/ purulent sputum  production or cp or chest tightness, subjective wheeze overt sinus or hb symptoms. No unusual exp hx or h/o childhood pna/ asthma or knowledge of premature birth.  Sleeping ok without nocturnal  or early am exacerbation  of respiratory  c/o's or need for noct saba. Also denies any obvious fluctuation of symptoms with weather or environmental changes or other aggravating or alleviating factors except as outlined above   Current Medications, Allergies, Complete Past Medical History, Past Surgical History, Family History, and Social History were reviewed in [employer] record.  ROS  The following are not active complaints unless bolded sore throat, dysphagia, dental problems, itching, sneezing,  nasal congestion or excess/ purulent secretions, ear ache,   fever, chills, sweats, unintended wt loss, pleuritic or exertional cp, hemoptysis,  orthopnea pnd or leg swelling bilateral better  , presyncope, palpitations, heartburn, abdominal pain, anorexia, nausea, vomiting, diarrhea  or change in bowel or urinary habits, change in stools or urine, dysuria,hematuria,  rash, arthralgias, visual complaints, headache, numbness weakness or ataxia or problems with walking or coordination,  change in mood/affect or memory.        Objective:   Physical Exam  amb wm nad gruff voice/ still rattling congested cough on fvc   06/11/2014           179 >  07/23/2014   184 > 09/03/2014 185 > 12/01/2014  196 > 03/03/2015  190 > 08/31/2015     192     05/27/14 180 lb (81.647 kg)  05/16/14 176 lb (79.833 kg)  05/09/14 180 lb (81.647 kg)    Vital signs reviewed   HEENT mild turbinate edema.  Oropharynx no thrush or excess pnd or cobblestoning.  No JVD or cervical adenopathy. Mild accessory muscle hypertrophy. Trachea midline, nl thryroid. Chest was slt barrel shaped and hyperinflated by percussion with diminished breath sounds and moderate increased exp time with late exp bilat rhonchi.  Hoover sign  positive at mid inspiration. Regular rate and rhythm without murmur gallop or rub or increase P2-trace pitting bilateral lower ext edema. Abd: no hsm, nl excursion. Ext warm without cyanosis or clubbing.            CXR PA and Lateral:   08/31/2015 :    I personally reviewed images and agree with radiology impression as follows:   No active cardiopulmonary disease.  Labs ordered/ reviewed:      Chemistry      Component Value Date/Time   NA 137 08/31/2015 0935   K 4.5 08/31/2015 0935   CL 95 (L) 08/31/2015 0935   CO2 36 (H) 08/31/2015 0935   BUN 12 08/31/2015 0935   CREATININE 1.04 08/31/2015 0935   CREATININE 0.94 11/03/2014 0000      Component Value Date/Time   CALCIUM 8.9 08/31/2015 0935   ALKPHOS 111 08/31/2015 0935   AST 17 08/31/2015 0935   ALT 17 08/31/2015 0935   BILITOT 0.4 08/31/2015 0935        Lab Results  Component Value Date   WBC 9.2 08/31/2015   HGB 15.8 08/31/2015   HCT 45.9 08/31/2015   MCV 95.8 08/31/2015   PLT 250.0 08/31/2015         Lab Results  Component Value Date   TSH 1.05 08/31/2015     Lab Results  Component Value Date   PROBNP 25.0 08/31/2015                    Assessment & Plan:

## 2015-08-31 NOTE — Patient Instructions (Addendum)
The key is to stop smoking completely before smoking completely stops you!   Start spironolactone /hct 25/25 twice daily and only use lasix as needed for excess swelling  Please remember to go to the lab and x-ray department downstairs for your tests - we will call you with the results when they are available.     Please schedule a follow up office visit in 4 weeks, sooner if needed

## 2015-08-31 NOTE — Progress Notes (Signed)
ATC, NA and no option to leave VM

## 2015-08-31 NOTE — Assessment & Plan Note (Signed)
HC03  36  05/16/14 corresponding to a pc02 above 55  - ono RA < 89% x 4 h 11 min > 09/12/2014  rec one liter per min hs and recheck = > done 9/141/6 with < 89% x 29m> no change rx  - HC03  08/31/2015  = 36   As of 08/31/2015  02 1lpm hs only   No am ha/ ams > no change rx needed

## 2015-09-01 ENCOUNTER — Encounter: Payer: Self-pay | Admitting: Family Medicine

## 2015-09-01 ENCOUNTER — Ambulatory Visit (INDEPENDENT_AMBULATORY_CARE_PROVIDER_SITE_OTHER): Payer: BLUE CROSS/BLUE SHIELD | Admitting: Family Medicine

## 2015-09-01 VITALS — BP 150/74 | HR 84 | Temp 98.3°F | Resp 18 | Ht 70.0 in | Wt 192.0 lb

## 2015-09-01 DIAGNOSIS — L259 Unspecified contact dermatitis, unspecified cause: Secondary | ICD-10-CM

## 2015-09-01 MED ORDER — PREDNISONE 20 MG PO TABS: ORAL_TABLET | ORAL | 0 refills | Status: DC

## 2015-09-01 NOTE — Progress Notes (Signed)
Subjective:    Patient ID: Randall Sawyer, male    DOB: Mar 01, 1955, 60 y.o.   MRN: [id number]  HPI Patient is at a rash on his torso and his arms and his back for approximately 3 weeks. The rash is extremely pruritic. There are erythematous papules approximately 3-4 mm in diameter. The majority are excoriated with little scabs on top. He's been scratching at them. They look like numerous bug bites. They're diffuse and widespread. There are no linear groupings or clusters Past Medical History:  Diagnosis Date  . Asthma   . COPD (chronic obstructive pulmonary disease) ([place])   . Hypertension   . Nephrolithiasis   . Tobacco abuse    Past Surgical History:  Procedure Laterality Date  . BIOPSY N/A 02/26/2015   Procedure: BIOPSY;  Surgeon: Rogene Houston, MD;  [facility];  Service: Endoscopy;  Laterality: N/A;  Gastric biopsies  . ESOPHAGOGASTRODUODENOSCOPY N/A 11/19/2014   Procedure: ESOPHAGOGASTRODUODENOSCOPY (EGD);  Surgeon: Rogene Houston, MD;  Location: [facility];  Service: Endoscopy;  Laterality: N/A;  2:15-rescheduled to 11/9 '@1'$ :11 Ann notified pt  . ESOPHAGOGASTRODUODENOSCOPY N/A 02/26/2015   Procedure: ESOPHAGOGASTRODUODENOSCOPY (EGD);  Surgeon: Rogene Houston, MD;  Location: [facility];  Service: Endoscopy;  Laterality: N/A;  1225   Current Outpatient Prescriptions on File Prior to Visit  Medication Sig Dispense Refill  . albuterol (PROVENTIL) (2.5 MG/3ML) 0.083% nebulizer solution Take 3 mLs (2.5 mg total) by nebulization every 6 (six) hours as needed for wheezing or shortness of breath. 150 mL 1  . furosemide (LASIX) 40 MG tablet Take one daily as needed for leg swelling 30 tablet 11  . Glycopyrrolate-Formoterol (BEVESPI) 9-4.8 MCG/ACT AERO Inhale 2 puffs into the lungs 2 (two) times daily. Take 2 puffs first thing in am and then another 2 puffs about 12 hours later. 1 Inhaler 11  . OXYGEN Inhale 1 L into the lungs at bedtime.    . pantoprazole (PROTONIX) 40  MG tablet TAKE 1 TABLET(40 MG) BY MOUTH DAILY BEFORE BREAKFAST 30 tablet 5  . PROAIR HFA 108 (90 Base) MCG/ACT inhaler INHALE 2 PUFFS BY MOUTH EVERY 4 HOURS AS NEEDED 8.5 g 11  . spironolactone-hydrochlorothiazide (ALDACTAZIDE) 25-25 MG tablet Take 1 tablet by mouth daily. 60 tablet 11   No current facility-administered medications on file prior to visit.    No Known Allergies Social History   Social History  . Marital status: Divorced    Spouse name: N/A  . Number of children: N/A  . Years of education: N/A   Occupational History  . Electrician     Social History Main Topics  . Smoking status: Current Every Day Smoker    Packs/day: 1.00    Types: Cigarettes    Start date: 12/30/1964  . Smokeless tobacco: Never Used     Comment: 1 pack x 50 yrs.   . Alcohol use No  . Drug use: No  . Sexual activity: Yes    Partners: Female   Other Topics Concern  . Not on file   Social History Narrative  . No narrative on file      Review of Systems  All other systems reviewed and are negative.      Objective:   Physical Exam  Cardiovascular: Normal rate, regular rhythm and normal heart sounds.   No murmur heard. Pulmonary/Chest: Effort normal and breath sounds normal.  Musculoskeletal: He exhibits edema.  Skin: Rash noted. There is erythema.  Vitals reviewed.  Assessment & Plan:  Contact dermatitis - Plan: predniSONE (DELTASONE) 20 MG tablet  Patient appears to have a contact dermatitis versus numerous insect bites. Begin prednisone taper pack. Keep scabies on the differential diagnosis.

## 2015-09-02 NOTE — Assessment & Plan Note (Signed)
Symptoms remain difficult to control. DDX of  difficult airways management almost all start with A and  include Adherence, Ace Inhibitors, Acid Reflux, Active Sinus Disease, Alpha 1 Antitripsin deficiency, Anxiety masquerading as Airways dz,  ABPA,  Allergy(esp in young), Aspiration (esp in elderly), Adverse effects of meds,  Active smokers, A bunch of PE's (a small clot burden can't cause this syndrome unless there is already severe underlying pulm or vascular dz with poor reserve) plus two Bs  = Bronchiectasis and Beta blocker use..and one C= CHF   Adherence is always the initial "prime suspect" and is a multilayered concern that requires a "trust but verify" approach in every patient - starting with knowing how to use medications, especially inhalers, correctly, keeping up with refills and understanding the fundamental difference between maintenance and prns vs those medications only taken for a very short course and then stopped and not refilled.   ? Acid (or non-acid) GERD > always difficult to exclude as up to 75% of pts in some series report no assoc GI/ Heartburn symptoms> rec continue max (24h)  acid suppression and diet restrictions/ reviewed     Active smoking a concern (see separate a/p)   ? Chf/ cor pulmonle > try add low dose spironolactone   I had an extended discussion with the patient reviewing all relevant studies completed to date and  lasting 15 to 20 minutes of a 25 minute visit    Each maintenance medication was reviewed in detail including most importantly the difference between maintenance and prns and under what circumstances the prns are to be triggered using an action plan format that is not reflected in the computer generated alphabetically organized AVS.    Please see instructions for details which were reviewed in writing and the patient given a copy highlighting the part that I personally wrote and discussed at today's ov.

## 2015-09-02 NOTE — Assessment & Plan Note (Signed)
bnp nl so probably has a component of cor pulmonale for which Aldactone is a maintenance at 25 mg twice a day with when necessary Lasix would be an optimal combination.

## 2015-09-02 NOTE — Assessment & Plan Note (Signed)

## 2015-09-08 ENCOUNTER — Telehealth: Payer: Self-pay | Admitting: *Deleted

## 2015-09-08 MED ORDER — PERMETHRIN 5 % EX CREA: 1.0000 "application " | TOPICAL_CREAM | Freq: Once | CUTANEOUS | 0 refills | Status: AC

## 2015-09-08 NOTE — Telephone Encounter (Signed)
Try elimite cream head to to and rinse off after 8 hours.   Wash all sheets and linens.  Biopsy if persistent.

## 2015-09-08 NOTE — Telephone Encounter (Signed)
Received call from patient.   Reports that medication is ineffective. Requested prescription for cream MD discussed in office.   MD please advise.

## 2015-09-08 NOTE — Telephone Encounter (Signed)
Call placed to patient and patient made aware.  

## 2015-09-22 ENCOUNTER — Ambulatory Visit (INDEPENDENT_AMBULATORY_CARE_PROVIDER_SITE_OTHER): Payer: BLUE CROSS/BLUE SHIELD | Admitting: Family Medicine

## 2015-09-22 ENCOUNTER — Encounter: Payer: Self-pay | Admitting: Family Medicine

## 2015-09-22 VITALS — BP 130/68 | HR 100 | Temp 98.1°F | Resp 18 | Ht 70.0 in | Wt 190.0 lb

## 2015-09-22 DIAGNOSIS — L299 Pruritus, unspecified: Secondary | ICD-10-CM

## 2015-09-22 MED ORDER — PERMETHRIN 5 % EX CREA: 1.0000 "application " | TOPICAL_CREAM | Freq: Once | CUTANEOUS | 0 refills | Status: AC

## 2015-09-22 MED ORDER — IVERMECTIN 3 MG PO TABS: 200.0000 ug/kg | ORAL_TABLET | Freq: Once | ORAL | 0 refills | Status: AC

## 2015-09-22 NOTE — Progress Notes (Signed)
Subjective:    Patient ID: Randall Sawyer, male    DOB: Apr 25, 1955, 60 y.o.   MRN: [id number]  HPI  09/01/15 Patient is at a rash on his torso and his arms and his back for approximately 3 weeks. The rash is extremely pruritic. There are erythematous papules approximately 3-4 mm in diameter. The majority are excoriated with little scabs on top. He's been scratching at them. They look like numerous bug bites. They're diffuse and widespread. There are no linear groupings or clusters.  At that time, my plan was: Patient appears to have a contact dermatitis versus numerous insect bites. Begin prednisone taper pack. Keep scabies on the differential diagnosis.  09/22/15 Prednisone helped the itching but the rash never went away. Once he stopped the prednisone, the intense itching returned. I then tried the patient on Elimite cream applied from the head to the toe. The patient washed it off after 14 hours. Continues to itch severely. There are diffuse small 2 mm erythematous papules all over his body primarily on his torso along his belt line and on his back. Rash to me still looks like scabies. Grover's disease is also a possibility. Past Medical History:  Diagnosis Date  . Asthma   . COPD (chronic obstructive pulmonary disease) ([place])   . Hypertension   . Nephrolithiasis   . Tobacco abuse    Past Surgical History:  Procedure Laterality Date  . BIOPSY N/A 02/26/2015   Procedure: BIOPSY;  Surgeon: Rogene Houston, MD;  [facility];  Service: Endoscopy;  Laterality: N/A;  Gastric biopsies  . ESOPHAGOGASTRODUODENOSCOPY N/A 11/19/2014   Procedure: ESOPHAGOGASTRODUODENOSCOPY (EGD);  Surgeon: Rogene Houston, MD;  [facility];  Service: Endoscopy;  Laterality: N/A;  2:15-rescheduled to 11/9 '@1'$ :4 Ann notified pt  . ESOPHAGOGASTRODUODENOSCOPY N/A 02/26/2015   Procedure: ESOPHAGOGASTRODUODENOSCOPY (EGD);  Surgeon: Rogene Houston, MD;  [facility];  Service: Endoscopy;   Laterality: N/A;  1225   Current Outpatient Prescriptions on File Prior to Visit  Medication Sig Dispense Refill  . albuterol (PROVENTIL) (2.5 MG/3ML) 0.083% nebulizer solution Take 3 mLs (2.5 mg total) by nebulization every 6 (six) hours as needed for wheezing or shortness of breath. 150 mL 1  . furosemide (LASIX) 40 MG tablet Take one daily as needed for leg swelling 30 tablet 11  . Glycopyrrolate-Formoterol (BEVESPI) 9-4.8 MCG/ACT AERO Inhale 2 puffs into the lungs 2 (two) times daily. Take 2 puffs first thing in am and then another 2 puffs about 12 hours later. 1 Inhaler 11  . OXYGEN Inhale 1 L into the lungs at bedtime.    . pantoprazole (PROTONIX) 40 MG tablet TAKE 1 TABLET(40 MG) BY MOUTH DAILY BEFORE BREAKFAST 30 tablet 5  . PROAIR HFA 108 (90 Base) MCG/ACT inhaler INHALE 2 PUFFS BY MOUTH EVERY 4 HOURS AS NEEDED 8.5 g 11  . spironolactone-hydrochlorothiazide (ALDACTAZIDE) 25-25 MG tablet Take 1 tablet by mouth daily. 60 tablet 11   No current facility-administered medications on file prior to visit.    No Known Allergies Social History   Social History  . Marital status: Divorced    Spouse name: N/A  . Number of children: N/A  . Years of education: N/A   Occupational History  . Electrician     Social History Main Topics  . Smoking status: Current Every Day Smoker    Packs/day: 1.00    Types: Cigarettes    Start date: 12/30/1964  . Smokeless tobacco: Never Used  Comment: 1 pack x 50 yrs.   . Alcohol use No  . Drug use: No  . Sexual activity: Yes    Partners: Female   Other Topics Concern  . Not on file   Social History Narrative  . No narrative on file      Review of Systems  All other systems reviewed and are negative.      Objective:   Physical Exam  Cardiovascular: Normal rate, regular rhythm and normal heart sounds.   No murmur heard. Pulmonary/Chest: Effort normal and breath sounds normal.  Musculoskeletal: He exhibits edema.  Skin: Rash noted.  There is erythema.  Vitals reviewed.         Assessment & Plan:  Pruritus - Plan: Pathology, ivermectin (STROMECTOL) 3 MG TABS tablet, permethrin (ELIMITE) 5 % cream  I suspect scabies. I anesthetized the lesion on his right flank was 0.1% lidocaine with epinephrine. I performed a shave biopsy and sent to pathology and labeled container. Hemostasis was achieved with Drysol and a Band-Aid. While awaiting the biopsy results, I will treat the patient with a 15 mg 1 time dose of ivermectin along with Elimite cream again applied head to toe treating my initial impression that this is scabies. Await the results of the biopsy

## 2015-09-25 LAB — PATHOLOGY

## 2015-09-28 ENCOUNTER — Other Ambulatory Visit (INDEPENDENT_AMBULATORY_CARE_PROVIDER_SITE_OTHER): Payer: BLUE CROSS/BLUE SHIELD

## 2015-09-28 ENCOUNTER — Ambulatory Visit (INDEPENDENT_AMBULATORY_CARE_PROVIDER_SITE_OTHER): Payer: BLUE CROSS/BLUE SHIELD | Admitting: Internal Medicine

## 2015-09-28 ENCOUNTER — Encounter: Payer: Self-pay | Admitting: Internal Medicine

## 2015-09-28 VITALS — BP 142/74 | HR 93 | Ht 70.0 in | Wt 195.0 lb

## 2015-09-28 DIAGNOSIS — R600 Localized edema: Secondary | ICD-10-CM | POA: Diagnosis not present

## 2015-09-28 DIAGNOSIS — J449 Chronic obstructive pulmonary disease, unspecified: Secondary | ICD-10-CM | POA: Diagnosis not present

## 2015-09-28 DIAGNOSIS — F17210 Nicotine dependence, cigarettes, uncomplicated: Secondary | ICD-10-CM | POA: Diagnosis not present

## 2015-09-28 DIAGNOSIS — J9611 Chronic respiratory failure with hypoxia: Secondary | ICD-10-CM | POA: Diagnosis not present

## 2015-09-28 DIAGNOSIS — J9612 Chronic respiratory failure with hypercapnia: Secondary | ICD-10-CM

## 2015-09-28 LAB — BASIC METABOLIC PANEL
BUN: 12 mg/dL (ref 6–23)
CALCIUM: 9.1 mg/dL (ref 8.4–10.5)
CHLORIDE: 90 meq/L — AB (ref 96–112)
CO2: 37 meq/L — AB (ref 19–32)
Creatinine, Ser: 1.06 mg/dL (ref 0.40–1.50)
GFR: 75.3 mL/min (ref >60.00)
Glucose, Bld: 100 mg/dL — ABNORMAL HIGH (ref 70–99)
Potassium: 4.6 mEq/L (ref 3.5–5.1)
SODIUM: 130 meq/L — AB (ref 135–145)

## 2015-09-28 MED ORDER — GLYCOPYRROLATE-FORMOTEROL 9-4.8 MCG/ACT IN AERO: 2.0000 | INHALATION_SPRAY | Freq: Two times a day (BID) | RESPIRATORY_TRACT | 11 refills | Status: DC

## 2015-09-28 NOTE — Progress Notes (Signed)
Subjective:    Patient ID: Randall Sawyer, male    DOB: 11/16/1955     MRN: [id number]    Brief patient profile:  49 yowm active smoker first noticed doe x around 2005 first on just albuterol then added spiriva then symbicort then stiolto which works the best so far and referred to pulmonary clinic 05/27/2014 by Dr Dennard Schaumann for copd eval and proved to have a GOLD IV severity  07/23/14 with hypercarbia.   History of Present Illness  05/27/2014 1st [facility] office visit/ Randall Sawyer   Chief Complaint  Patient presents with  . Pulmonary Consult    referred by Dr. Dennard Schaumann SOB x 1 year. C/o of SOB, fluid retention, prod cough with yellow thick mucus.  Wheezing late in the evenings. Left sided abdominal pain.   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                                      spiriva 2 puffs each am  Plan B = Back up - Only use your albuterol as a rescue medication  Plan C = crisis - Your albuterol nebulizer can be used up to every  4 hours with the goal of not needing at all eventually  Prednisone 10 mg take  4 each am x 2 days,   2 each am x 2 days,  1 each am x 2 days and stop      06/11/2014 f/u ov/Randall Sawyer re: copd still smoking   Chief Complaint  Patient presents with  . Follow-up    fluid retention not as bad; cough; chest tightness;states here for paperwork;  last used ventolin 1 hours - has not tried rechallenge/ sleeps ok  Just walking to mb makes him stop one  half way going both ways though uphill to it.  Not needing neb as much  No noct or early am exac  Has not worked since 05/27/14 rec Plan A =  Automatic =  Symbicort 160 Take 2 puffs first thing in am and then another 2 puffs about 12 hours later.                                      spiriva 2 puffs each am     Plan B = Back up - Only use your albuterol as a rescue medication Plan C = crisis - Your albuterol nebulizer can be used up to every  4 hours with the goal of not needing at all eventually  The key is to stop smoking completely before smoking completely stops you!       12/01/2014  f/u ov/Randall Sawyer re: GOLD IV copd/ still  smoking/ on symb/incruse  Chief Complaint  Patient presents with  . Follow-up    Pt states that his breathing is unchanged. He is using proair 1-2 x per day on average and has not used neb. He is still smoking- now down to 4 cigs per day.   No change Doe x MMRC2 = reports can't walk a nl pace on a flat grade s sob but still does [employer] shopping at slow pace, stopping frequently  rec No change rx    03/03/2015  f/u ov/Randall Sawyer re: copd IV/ still smoking/ maint on symb/incruse / 02 1lpm Chief Complaint  Patient presents with  . Follow-up    Breathing is unchanged. He is still using proair 1-2 x daily and has not needed neb. He states "down to 6 cigs a day".   rec Try BEVESPI  Take 2 puffs first thing in am and then another 2 puffs about 12 hours later and stop symbicort/incruse  Try E- cigarettes as one way bridge off all tobacco products     08/31/2015  f/u ov/Randall Sawyer re: copd gold iv/ bevespi 2bid / 1lpm hs but still smoking  Chief Complaint  Patient presents with  . Follow-up    Breathing is unchanged. He is using proair 2-3 x per day "because it's been so hot". He has not used neb.   mb and back s stopping slow pace/ no 02 Rare noct cough/congestion maybe once a week    rec The key is to stop smoking completely before smoking completely stops you!  Start  spironolactone /hct 25/25 twice daily and only use lasix as needed for excess swelling     09/28/2015  f/u ov/Randall Sawyer re: COPD gold IV / still smoking/ on bevespi and saba maybe 3 x weekly/ 02 1lpm hs  Chief Complaint  Patient presents with  . Follow-up    Breathing is "pretty good". He denies any new co's today. He uses proair 3 x per wk on average and has not needed neb.    mailbox and back is easier to do now Swelling no better but misunderstood how to use aldactazide/ lasix     No obvious other day to day or daytime variabilty or assoc excess/ purulent sputum production or cp or chest tightness, subjective wheeze overt sinus or hb symptoms. No unusual exp hx or h/o childhood pna/ asthma or knowledge of premature birth.  Sleeping ok without nocturnal  or early am exacerbation  of respiratory  c/o's or need for noct saba. Also denies any obvious fluctuation of symptoms with weather or environmental changes or other aggravating or alleviating factors except as outlined above   Current Medications, Allergies, Complete Past Medical History, Past Surgical History, Family History, and Social History were reviewed in [employer] [facility] record.  ROS  The following are not active complaints unless bolded sore throat, dysphagia, dental problems, itching, sneezing,  nasal congestion or excess/ purulent secretions, ear ache,   fever, chills, sweats, unintended wt loss, pleuritic or exertional cp, hemoptysis,  orthopnea pnd or leg swelling bilateral no better  , presyncope, palpitations, heartburn, abdominal pain, anorexia, nausea, vomiting, diarrhea  or change in bowel or urinary habits, change in stools or urine, dysuria,hematuria,  rash, arthralgias, visual complaints, headache, numbness weakness or ataxia or problems with walking or coordination,  change in mood/affect or memory.        Objective:   Physical Exam  amb wm nad gruff voice/ still mild rattling congested cough on fvc  06/11/2014           179 >  07/23/2014   184 > 09/03/2014 185 > 12/01/2014  196 > 03/03/2015  190 > 08/31/2015     192 > 09/28/2015  195     05/27/14 180 lb (81.647 kg)  05/16/14 176 lb (79.833 kg)  05/09/14 180 lb (81.647 kg)    Vital signs reviewed  - note 93% RA on arrival   HEENT mild turbinate edema.  Edentulous/ Oropharynx no thrush or excess pnd or cobblestoning.  No JVD or cervical adenopathy. Mild accessory muscle hypertrophy. Trachea midline, nl thryroid. Chest was slt barrel shaped and hyperinflated by percussion with diminished breath sounds and moderate increased exp time with late exp bilat rhonchi.  Hoover sign positive at mid inspiration. Regular rate and rhythm without murmur gallop or rub or increase P2-trace pitting bilateral lower ext edema L> R  Abd: no hsm, nl excursion. Ext warm without cyanosis or clubbing.          CXR PA and Lateral:   08/31/2015 :    I personally reviewed images and agree with radiology impression as follows:   No active cardiopulmonary disease.      Labs ordered/ reviewed:     Chemistry      Component Value Date/Time   NA 130 (L) 09/28/2015 0946   K 4.6 09/28/2015 0946   CL 90 (L) 09/28/2015 0946   CO2 37 (H) 09/28/2015 0946   BUN 12 09/28/2015 0946   CREATININE 1.06 09/28/2015 0946   CREATININE 0.94 11/03/2014 0000      Component Value Date/Time   CALCIUM 9.1 09/28/2015 0946   ALKPHOS 111 08/31/2015 0935   AST 17 08/31/2015 0935   ALT 17 08/31/2015 0935   BILITOT 0.4 08/31/2015 0935                            Assessment & Plan:   Outpatient Encounter Prescriptions as of 09/28/2015  Medication Sig  . albuterol (PROVENTIL) (2.5 MG/3ML) 0.083% nebulizer solution Take 3 mLs (2.5 mg total) by nebulization every 6 (six) hours as needed for wheezing or shortness of breath.  . furosemide (LASIX) 40 MG tablet Take one daily as needed for leg swelling  . Glycopyrrolate-Formoterol (BEVESPI) 9-4.8 MCG/ACT AERO Inhale 2 puffs into  the lungs 2 (two) times daily. Take 2 puffs first thing in am and then another 2 puffs about 12 hours later.  . OXYGEN Inhale 1 L into the lungs at bedtime.  . pantoprazole (PROTONIX) 40 MG tablet TAKE 1 TABLET(40 MG) BY MOUTH DAILY BEFORE BREAKFAST  . PROAIR HFA 108 (90 Base) MCG/ACT inhaler INHALE 2 PUFFS BY MOUTH EVERY 4 HOURS AS NEEDED  . spironolactone-hydrochlorothiazide (ALDACTAZIDE) 25-25 MG tablet Take 1 tablet by mouth daily.  . [DISCONTINUED] Glycopyrrolate-Formoterol (BEVESPI) 9-4.8 MCG/ACT AERO Inhale 2 puffs into the lungs 2 (two) times daily. Take 2 puffs first thing in am and then another 2 puffs about 12 hours later.  . [DISCONTINUED] Glycopyrrolate-Formoterol (BEVESPI) 9-4.8 MCG/ACT AERO Inhale 2 puffs into the lungs 2 (two) times daily. Take 2 puffs first thing in am and then another 2 puffs about 12 hours later.   No facility-administered encounter medications on file as of 09/28/2015.

## 2015-09-28 NOTE — Progress Notes (Signed)
Spoke with pt and notified of results per Dr. Wert. Pt verbalized understanding and denied any questions. 

## 2015-09-28 NOTE — Patient Instructions (Addendum)
Please remember to go to the lab  department downstairs for your tests - we will call you with the results when they are available.  Please see patient coordinator before you leave today  to schedule venous dopplers   Plan A = Automatic =  Bevespi Take 2 puffs first thing in am and then another 2 puffs about 12 hours later.                                       Aldactazide one twice daily   Plan B = Backup For swelling >>>  take lasix 40 up to twice daily  For breathing >> Only use your albuterol(proair) as a rescue medication to be used if you can't catch your breath by resting or doing a relaxed purse lip breathing pattern.  - The less you use it, the better it will work when you need it. - Ok to use the inhaler up to 2 puffs  every 4 hours if you must but call for appointment if use goes up over your usual need - Don't leave home without it !!  (think of it like the spare tire for your car)   Plan C = Crisis - only use your albuterol nebulizer if you first try Plan B and it fails to help > ok to use the nebulizer up to every 4 hours but if start needing it regularly call for immediate appointment   The key is to stop smoking completely before smoking completely stops you!   Please schedule a follow up office visit in 6 weeks, call sooner if needed

## 2015-10-02 ENCOUNTER — Ambulatory Visit (HOSPITAL_COMMUNITY)
Admission: RE | Admit: 2015-10-02 | Discharge: 2015-10-02 | Disposition: A | Discharge status: Home / Self Care | Payer: BLUE CROSS/BLUE SHIELD | Source: Ambulatory Visit | Attending: Cardiology | Admitting: Cardiology

## 2015-10-02 DIAGNOSIS — R600 Localized edema: Secondary | ICD-10-CM | POA: Diagnosis present

## 2015-10-03 ENCOUNTER — Other Ambulatory Visit: Payer: Self-pay | Admitting: Family Medicine

## 2015-10-03 DIAGNOSIS — L259 Unspecified contact dermatitis, unspecified cause: Secondary | ICD-10-CM

## 2015-10-04 ENCOUNTER — Encounter: Payer: Self-pay | Admitting: Internal Medicine

## 2015-10-04 NOTE — Assessment & Plan Note (Addendum)
Venous dopplers 10/02/2015 > neg   C/w cor pulmonale - if K and creat tol plan is to titrate up the aldactazide  and use the lasix prn to control though likely not eliminate the swelling

## 2015-10-04 NOTE — Assessment & Plan Note (Signed)
>   3 min Discussed the risks and costs (both direct and indirect)  of smoking relative to the benefits of quitting but patient unwilling to commit at this point to a specific quit date.    Although I don't endorse regular use of e cigs/ many pts find them helpful; however, I emphasized they should be considered a "one-way bridge" off all tobacco products.  

## 2015-10-04 NOTE — Assessment & Plan Note (Addendum)
-   HC03 36 05/16/14 - 05/27/2014 p extensive coaching HFA effectiveness =    90% > try symbicort/ incruse  - 06/11/14 Spriometry FEV1  0.57 (15%) ratio 34 1 h p last saba  - PFTs 07/23/2014   FEV1  0.84 (25%) ratio 44 p 16% improvement and dlco 65 p am symbicort  -  09/03/2014  Walked RA x 3 laps @ 185 ft each stopped due to  End of study, nl pace, no sob or desat   - 03/03/2015  extensive coaching HFA effectiveness =    90% > try BEVESPI > no better 09/28/2015 but vol overloaded   With a nl BNP he probably has sign cor pulmonale at thi spint so best rx is aldactazide and prn lasix and no change in resp rx at this point  I had an extended discussion with the patient reviewing all relevant studies completed to date and  lasting 15 to 20 minutes of a 25 minute visit    Each maintenance medication was reviewed in detail including most importantly the difference between maintenance and prns and under what circumstances the prns are to be triggered using an action plan format that is not reflected in the computer generated alphabetically organized AVS.    Please see instructions for details which were reviewed in writing and the patient given a copy highlighting the part that I personally wrote and discussed at today's ov.

## 2015-10-04 NOTE — Assessment & Plan Note (Signed)
HC03  36  05/16/14 corresponding to a pc02 above 55  - ono RA < 89% x 4 h 11 min > 09/12/2014  rec one liter per min hs and recheck = > done 9/141/6 with < 89% x 41m> no change rx  - HC03  09/28/2015  = 37   As of 08/31/2015  02 1lpm hs only as sats ok otherwise

## 2015-10-05 ENCOUNTER — Telehealth: Payer: Self-pay | Admitting: Internal Medicine

## 2015-10-05 NOTE — Telephone Encounter (Signed)
Notes Recorded by [name], CMA on 10/05/2015 at 9:59 AM EDT [facility] ------ Notes Recorded by Tanda Rockers, MD on 10/03/2015 at 6:56 AM EDT Call patient : Study is unremarkable, no change in recs --------------------------------------------------------------------------------------------------------------- Spoke with pt. He is aware of results. Nothing further was needed.

## 2015-10-05 NOTE — Progress Notes (Signed)
LMTCB

## 2015-10-12 ENCOUNTER — Other Ambulatory Visit (INDEPENDENT_AMBULATORY_CARE_PROVIDER_SITE_OTHER): Payer: Self-pay | Admitting: Internal Medicine

## 2015-10-12 ENCOUNTER — Other Ambulatory Visit: Payer: Self-pay | Admitting: Cardiology

## 2015-11-05 ENCOUNTER — Other Ambulatory Visit (INDEPENDENT_AMBULATORY_CARE_PROVIDER_SITE_OTHER): Payer: Self-pay | Admitting: Internal Medicine

## 2015-11-09 ENCOUNTER — Other Ambulatory Visit: Payer: Self-pay | Admitting: Cardiology

## 2015-11-09 ENCOUNTER — Ambulatory Visit (INDEPENDENT_AMBULATORY_CARE_PROVIDER_SITE_OTHER): Payer: BLUE CROSS/BLUE SHIELD | Admitting: Internal Medicine

## 2015-11-09 ENCOUNTER — Encounter: Payer: Self-pay | Admitting: Internal Medicine

## 2015-11-09 ENCOUNTER — Other Ambulatory Visit (INDEPENDENT_AMBULATORY_CARE_PROVIDER_SITE_OTHER): Payer: BLUE CROSS/BLUE SHIELD

## 2015-11-09 VITALS — BP 120/70 | HR 92 | Ht 68.0 in | Wt 186.0 lb

## 2015-11-09 DIAGNOSIS — R600 Localized edema: Secondary | ICD-10-CM

## 2015-11-09 DIAGNOSIS — J449 Chronic obstructive pulmonary disease, unspecified: Secondary | ICD-10-CM | POA: Diagnosis not present

## 2015-11-09 DIAGNOSIS — J9611 Chronic respiratory failure with hypoxia: Secondary | ICD-10-CM

## 2015-11-09 DIAGNOSIS — J9612 Chronic respiratory failure with hypercapnia: Secondary | ICD-10-CM | POA: Diagnosis not present

## 2015-11-09 LAB — BASIC METABOLIC PANEL
BUN: 17 mg/dL (ref 6–23)
CHLORIDE: 90 meq/L — AB (ref 96–112)
CO2: 39 mEq/L — ABNORMAL HIGH (ref 19–32)
CREATININE: 1.35 mg/dL (ref 0.40–1.50)
Calcium: 9.7 mg/dL (ref 8.4–10.5)
GFR: 56.94 mL/min — AB (ref >60.00)
Glucose, Bld: 97 mg/dL (ref 70–99)
Potassium: 4.6 mEq/L (ref 3.5–5.1)
Sodium: 136 mEq/L (ref 135–145)

## 2015-11-09 NOTE — Assessment & Plan Note (Addendum)
Venous dopplers 10/02/2015 > neg bilaterally   C/w cor pulmonale > lasix / aldatazide tol well, ok to take one of each daily and supplement with one of each prn increase leg swelling

## 2015-11-09 NOTE — Progress Notes (Signed)
Spoke with pt and notified of results per Dr. Wert. Pt verbalized understanding and denied any questions. 

## 2015-11-09 NOTE — Progress Notes (Signed)
Subjective:    Patient ID: Randall Sawyer, male    DOB: [birth year]     MRN: [id number]    Brief patient profile:  28 yowm active smoker first noticed doe x around 2005 first on just albuterol then added spiriva then symbicort then stiolto which works the best so far and referred to pulmonary clinic 05/27/2014 by Dr Dennard Schaumann for copd eval and proved to have a GOLD IV severity  07/23/14 with hypercarbia.   History of Present Illness  05/27/2014 1st [facility] office visit/ Randall Sawyer   Chief Complaint  Patient presents with  . Pulmonary Consult    referred by Dr. Dennard Schaumann SOB x 1 year. C/o of SOB, fluid retention, prod cough with yellow thick mucus.  Wheezing late in the evenings. Left sided abdominal pain.   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                                      spiriva 2 puffs each am  Plan B = Back up - Only use your albuterol as a rescue medication  Plan C = crisis - Your albuterol nebulizer can be used up to every  4 hours with the goal of not needing at all eventually  Prednisone 10 mg take  4 each am x 2 days,   2 each am x 2 days,  1 each am x 2 days and stop      06/11/2014 f/u ov/Randall Sawyer re: copd still smoking   Chief Complaint  Patient presents with  . Follow-up    fluid retention not as bad; cough; chest tightness;states here for paperwork;  last used ventolin 1 hours - has not tried rechallenge/ sleeps ok  Just walking to mb makes him stop one  half way going both ways though uphill to it.  Not needing neb as much  No noct or early am exac  Has not worked since 05/27/14 rec Plan A =  Automatic =  Symbicort 160 Take 2 puffs first thing in am and then another 2 puffs about 12 hours later.                                      spiriva 2 puffs each am     Plan B = Back up - Only use your albuterol as a rescue medication Plan C = crisis - Your albuterol nebulizer can be used up to every  4 hours with the goal of not needing at all eventually  The key is to stop smoking completely before smoking completely stops you!       12/01/2014  f/u ov/Randall Sawyer re: GOLD IV copd/ still  smoking/ on symb/incruse  Chief Complaint  Patient presents with  . Follow-up    Pt states that his breathing is unchanged. He is using proair 1-2 x per day on average and has not used neb. He is still smoking- now down to 4 cigs per day.   No change Doe x MMRC2 = reports can't walk a nl pace on a flat grade s sob but still does [employer] shopping at slow pace, stopping frequently  rec No change rx    03/03/2015  f/u ov/Randall Sawyer re: copd IV/ still smoking/ maint on symb/incruse / 02 1lpm Chief Complaint  Patient presents with  . Follow-up    Breathing is unchanged. He is still using proair 1-2 x daily and has not needed neb. He states "down to 6 cigs a day".   rec Try BEVESPI  Take 2 puffs first thing in am and then another 2 puffs about 12 hours later and stop symbicort/incruse  Try E- cigarettes as one way bridge off all tobacco products     08/31/2015  f/u ov/Randall Sawyer re: copd gold iv/ bevespi 2bid / 1lpm hs but still smoking  Chief Complaint  Patient presents with  . Follow-up    Breathing is unchanged. He is using proair 2-3 x per day "because it's been so hot". He has not used neb.   mb and back s stopping slow pace/ no 02 Rare noct cough/congestion maybe once a week    rec The key is to stop smoking completely before smoking completely stops you!  Start  spironolactone /hct 25/25 twice daily and only use lasix as needed for excess swelling     09/28/2015  f/u ov/Randall Sawyer re: COPD gold IV / still smoking/ on bevespi and saba maybe 3 x weekly/ 02 1lpm hs  Chief Complaint  Patient presents with  . Follow-up    Breathing is "pretty good". He denies any new co's today. He uses proair 3 x per wk on average and has not needed neb.    mailbox and back is easier to do now Swelling no better but misunderstood how to use aldactazide/ lasix   Please see patient coordinator before you leave today  to schedule venous dopplers> neg  Plan A = Automatic =  Bevespi Take 2 puffs first thing in am and then another 2 puffs about 12 hours later.                                       Aldactazide one twice daily  Plan B = Backup For swelling >>>  take lasix 40 up to twice daily  For breathing >> Only use your albuterol(proair)  Plan C = Crisis - only use your albuterol nebulizer if you first try Plan B and it fails to help >  The key is to stop smoking completely before smoking completely stops you!      11/09/2015  f/u ov/Randall Sawyer re: copd IV / still smoking on bevespi 2 bid / lasix/aldatazide daily 1lpm hs  Chief Complaint  Patient presents with  . Follow-up    Breathing is "pretty good" he is using proair 2 x per wk on average and has not needed neb. No new co's today.    doe = MMRC3 = can't walk 100 yards even at a slow pace at a flat grade s stopping due to sob     No obvious other day to  day or daytime variabilty or assoc excess/ purulent sputum production or cp or chest tightness, subjective wheeze overt sinus or hb symptoms. No unusual exp hx or h/o childhood pna/ asthma or knowledge of premature birth.  Sleeping ok without nocturnal  or early am exacerbation  of respiratory  c/o's or need for noct saba. Also denies any obvious fluctuation of symptoms with weather or environmental changes or other aggravating or alleviating factors except as outlined above     Current Medications, Allergies, Complete Past Medical History, Past Surgical History, Family History, and Social History were reviewed in [employer] record.  ROS  The following are not active complaints unless bolded sore throat, dysphagia, dental problems, itching, sneezing,  nasal congestion or excess/ purulent secretions, ear ache,   fever, chills, sweats, unintended wt loss, pleuritic or exertional cp, hemoptysis,  orthopnea pnd or leg swelling bilateral  better  , presyncope, palpitations, heartburn, abdominal pain, anorexia, nausea, vomiting, diarrhea  or change in bowel or urinary habits, change in stools or urine, dysuria,hematuria,  rash, arthralgias, visual complaints, headache, numbness weakness or ataxia or problems with walking or coordination,  change in mood/affect or memory.        Objective:   Physical Exam  amb wm nad gruff voice   06/11/2014           179 >  07/23/2014   184 > 09/03/2014 185 > 12/01/2014  196 > 03/03/2015  190 > 08/31/2015     192 > 09/28/2015  195  > 11/09/2015   186     05/27/14 180 lb (81.647 kg)  05/16/14 176 lb (79.833 kg)  05/09/14 180 lb (81.647 kg)    Vital signs reviewed  - note   92%   RA on arrival   HEENT mild turbinate edema.  Edentulous/ Oropharynx no thrush or excess pnd or cobblestoning.  No JVD or cervical adenopathy. Mild accessory muscle hypertrophy. Trachea midline, nl thryroid. Chest was slt barrel shaped and hyperinflated by percussion with diminished breath sounds and moderate increased exp time with min  late exp bilat rhonchi.  Hoover sign positive at mid inspiration. Regular rate and rhythm without murmur gallop or rub or increase P2-  Trace pitting bilateral lower ext edema L> R  Abd: no hsm, nl excursion. Ext warm without cyanosis or clubbing.          CXR PA and Lateral:   08/31/2015 :    I personally reviewed images and agree with radiology impression as follows:   No active cardiopulmonary  disease.     Chemistry      Component Value Date/Time   NA 136 11/09/2015 0922   K 4.6 11/09/2015 0922   CL 90 (L) 11/09/2015 0922   CO2 39 (H) 11/09/2015 0922   BUN 17 11/09/2015 0922   CREATININE 1.35 11/09/2015 0922   CREATININE 0.94 11/03/2014 0000      Component Value Date/Time   CALCIUM 9.7 11/09/2015 0922   ALKPHOS 111 08/31/2015 0935   AST 17 08/31/2015 0935   ALT 17 08/31/2015 0935   BILITOT 0.4 08/31/2015 0935                  Assessment & Plan:

## 2015-11-09 NOTE — Patient Instructions (Addendum)
Change lasix to where you take 40 mg daily and aldactazide 25-25 one of each  daily    If the swelling gets worse, take the pm dose  of each one   The key is to stop smoking completely before smoking completely stops you!    Please remember to go to the lab   department downstairs for your tests - we will call you with the results when they are available.   Please schedule a follow up visit in 3 months but call sooner if needed

## 2015-11-15 NOTE — Assessment & Plan Note (Signed)
HC03  36  05/16/14 corresponding to a pc02 above 55  - ono RA < 89% x 4 h 11 min > 09/12/2014  rec one liter per min hs and recheck = > done 9/141/6 with < 89% x 18m> no change rx  - HC03  11/09/2015  = 39   As of 11/09/2015  02 1lpm hs only as sats ok otherwise

## 2015-11-15 NOTE — Assessment & Plan Note (Signed)
-   HC03 36 05/16/14 - 05/27/2014 p extensive coaching HFA effectiveness =    90% > try symbicort/ incruse  - 06/11/14 Spriometry FEV1  0.57 (15%) ratio 34 1 h p last saba  - PFTs 07/23/2014   FEV1  0.84 (25%) ratio 44 p 16% improvement and dlco 65 p am symbicort  -  09/03/2014  Walked RA x 3 laps @ 185 ft each stopped due to  End of study, nl pace, no sob or desat   - 03/03/2015  extensive coaching HFA effectiveness =    90% > try BEVESPI > no better 09/28/2015 but vol overloaded   Pt is Group B in terms of symptom/risk and laba/lama therefore appropriate rx at this point = bevespi   I had an extended discussion with the patient reviewing all relevant studies completed to date and  lasting 15 to 20 minutes of a 25 minute visit    Each maintenance medication was reviewed in detail including most importantly the difference between maintenance and prns and under what circumstances the prns are to be triggered using an action plan format that is not reflected in the computer generated alphabetically organized AVS.    Please see instructions for details which were reviewed in writing and the patient given a copy highlighting the part that I personally wrote and discussed at today's ov.

## 2015-11-27 ENCOUNTER — Ambulatory Visit (INDEPENDENT_AMBULATORY_CARE_PROVIDER_SITE_OTHER): Payer: BLUE CROSS/BLUE SHIELD | Admitting: Internal Medicine

## 2015-12-02 ENCOUNTER — Ambulatory Visit (INDEPENDENT_AMBULATORY_CARE_PROVIDER_SITE_OTHER): Payer: BLUE CROSS/BLUE SHIELD | Admitting: Internal Medicine

## 2015-12-02 ENCOUNTER — Encounter (INDEPENDENT_AMBULATORY_CARE_PROVIDER_SITE_OTHER): Payer: Self-pay | Admitting: Internal Medicine

## 2015-12-02 VITALS — BP 120/62 | HR 80 | Temp 97.5°F | Ht 68.0 in | Wt 187.1 lb

## 2015-12-02 DIAGNOSIS — K219 Gastro-esophageal reflux disease without esophagitis: Secondary | ICD-10-CM

## 2015-12-02 DIAGNOSIS — R140 Abdominal distension (gaseous): Secondary | ICD-10-CM | POA: Diagnosis not present

## 2015-12-02 NOTE — Progress Notes (Signed)
Subjective:    Patient ID: Randall Sawyer, male    DOB: [birth year], 60 y.o.   MRN: [id number]  HPI Here today for f/u. Last seen in May of this year. Hx of early satiety and postprandial fullness and epigastric pain. Last EGD in February ( see below).  Weight in May was 189. He tells me he is doing good. He does have some bloating.  He may eat a half of a hamburger and he is full. He says he does not have any acid reflux. He has a BM about twice a day. No melena or BRRB.   02/26/2015 EGD  Indications: Patient is 60 year old Caucasian male who underwent diagnostic EGD on 11/19/2014 for early satiety postprandial fullness and epigastric pain. EGD revealed nodular antral gastritis. Biopsy confirmed H pylori infection and also revealed changes of intestinal metaplasia with atypia. Patient is returning for follow-up EGD. Patient He does not feel much better. He has lost 8 pounds in the last 3 months.  Impression: Small sliding hiatal hernia without evidence of erosive esophagitis. Fine nodularity to antral mucosa with single prepyloric polyp. Gastric erosions have healed since last EGD of 11/29/2014. Biopsies taken from antral mucosa and also from prepyloric polyp and submitted separately. Biopsy results reviewed with patient. Reactive gastropathy and polyps are hyperplastic.Marland Kitchen He remains with postprandial bloating. He eats one or two meals a day. He wants to hold off further evaluation until office visit.   11/19/2014 EGD  Indications: Patient is 3-year-old Caucasian male who presents with over a year history of postprandial bloating described intractable preventing him from eating as well as left upper quadrant abdominal pain. Workup includes negative ultrasound for cholelithiasis unremarkable abdominal pelvic CT and recent ultrasound was negative for  ascites. Comprehensive chemistry panel and CBC were unremarkable. He has not responded to OTC medications. He is undergoing diagnostic EGD.   Biopsy revealed intestinal metaplasia with atypia. He was postive for H. Pylori.  Treated with  Pylera.        11/18/2014 US abdomen: bloating, abdominal distention IMPRESSION: No ascites identified.  Fatty infiltration of liver.  Review of Systems Past Medical History:  Diagnosis Date  . Asthma   . COPD (chronic obstructive pulmonary disease) ([place])   . Hypertension   . Nephrolithiasis   . Tobacco abuse     Past Surgical History:  Procedure Laterality Date  . BIOPSY N/A 02/26/2015   Procedure: BIOPSY;  Surgeon: Rogene Houston, MD;  Location: [facility];  Service: Endoscopy;  Laterality: N/A;  Gastric biopsies  . ESOPHAGOGASTRODUODENOSCOPY N/A 11/19/2014   Procedure: ESOPHAGOGASTRODUODENOSCOPY (EGD);  Surgeon: Rogene Houston, MD;  Location: [facility];  Service: Endoscopy;  Laterality: N/A;  2:15-rescheduled to 11/9 '@1'$ :72 Ann notified pt  . ESOPHAGOGASTRODUODENOSCOPY N/A 02/26/2015   Procedure: ESOPHAGOGASTRODUODENOSCOPY (EGD);  Surgeon: Rogene Houston, MD;  Location: [facility];  Service: Endoscopy;  Laterality: N/A;  1225    No Known Allergies  Current Outpatient Prescriptions on File Prior to Visit  Medication Sig Dispense Refill  . albuterol (PROVENTIL) (2.5 MG/3ML) 0.083% nebulizer solution Take 3 mLs (2.5 mg total) by nebulization every 6 (six) hours as needed for wheezing or shortness of breath. 150 mL 1  . furosemide (LASIX) 40 MG tablet TAKE 1 AND 1/2 TABLET BY MOUTH EVERY DAY 45 tablet 0  . Glycopyrrolate-Formoterol (BEVESPI) 9-4.8 MCG/ACT AERO Inhale 2 puffs into the lungs 2 (two) times daily. Take 2 puffs first thing in am and then another 2 puffs about 12  hours later. 1 Inhaler 11  . OXYGEN Inhale 1 L into the  lungs at bedtime.    . pantoprazole (PROTONIX) 40 MG tablet TAKE 1 TABLET(40 MG) BY MOUTH DAILY BEFORE BREAKFAST 30 tablet 5  . PROAIR HFA 108 (90 Base) MCG/ACT inhaler INHALE 2 PUFFS BY MOUTH EVERY 4 HOURS AS NEEDED 8.5 g 11  . spironolactone-hydrochlorothiazide (ALDACTAZIDE) 25-25 MG tablet Take 1 tablet by mouth daily. 60 tablet 11   No current facility-administered medications on file prior to visit.        Objective:   Physical Exam Blood pressure 120/62, pulse 80, temperature 97.5 F (36.4 C), height '5\' 8"'$  (1.727 m), weight 187 lb 1.6 oz (84.9 kg). Alert and oriented. Skin warm and dry. Oral mucosa is moist.   . Sclera anicteric, conjunctivae is pink. Thyroid not enlarged. No cervical lymphadenopathy. Lungs clear. Heart regular rate and rhythm.  Abdomen is soft. Bowel sounds are positive. No hepatomegaly. No abdominal masses felt. No tenderness.  No edema to lower extremities.          Assessment & Plan:  GERD. Bloating. He says he is doing well. No abdominal pain. He will continue the Protonix. OV in 1 year. Needs to try to loose weight.

## 2015-12-02 NOTE — Patient Instructions (Signed)
OV in 1 year.  Continue the Protonix.

## 2015-12-07 ENCOUNTER — Other Ambulatory Visit: Payer: Self-pay | Admitting: Cardiology

## 2016-02-09 ENCOUNTER — Other Ambulatory Visit (INDEPENDENT_AMBULATORY_CARE_PROVIDER_SITE_OTHER): Payer: BLUE CROSS/BLUE SHIELD

## 2016-02-09 ENCOUNTER — Encounter: Payer: Self-pay | Admitting: Internal Medicine

## 2016-02-09 ENCOUNTER — Ambulatory Visit (INDEPENDENT_AMBULATORY_CARE_PROVIDER_SITE_OTHER): Payer: BLUE CROSS/BLUE SHIELD | Admitting: Internal Medicine

## 2016-02-09 VITALS — BP 136/72 | HR 99 | Ht 70.0 in | Wt 189.0 lb

## 2016-02-09 DIAGNOSIS — J9611 Chronic respiratory failure with hypoxia: Secondary | ICD-10-CM | POA: Diagnosis not present

## 2016-02-09 DIAGNOSIS — R600 Localized edema: Secondary | ICD-10-CM

## 2016-02-09 DIAGNOSIS — F17210 Nicotine dependence, cigarettes, uncomplicated: Secondary | ICD-10-CM

## 2016-02-09 DIAGNOSIS — J449 Chronic obstructive pulmonary disease, unspecified: Secondary | ICD-10-CM | POA: Diagnosis not present

## 2016-02-09 DIAGNOSIS — J9612 Chronic respiratory failure with hypercapnia: Secondary | ICD-10-CM

## 2016-02-09 LAB — BASIC METABOLIC PANEL
BUN: 24 mg/dL — AB (ref 6–23)
CALCIUM: 9.8 mg/dL (ref 8.4–10.5)
CHLORIDE: 93 meq/L — AB (ref 96–112)
CO2: 40 meq/L — AB (ref 19–32)
CREATININE: 1.44 mg/dL (ref 0.40–1.50)
GFR: 52.81 mL/min — ABNORMAL LOW (ref >60.00)
Glucose, Bld: 109 mg/dL — ABNORMAL HIGH (ref 70–99)
Potassium: 4.1 mEq/L (ref 3.5–5.1)
Sodium: 136 mEq/L (ref 135–145)

## 2016-02-09 MED ORDER — GLYCOPYRROLATE-FORMOTEROL 9-4.8 MCG/ACT IN AERO: 2.0000 | INHALATION_SPRAY | Freq: Two times a day (BID) | RESPIRATORY_TRACT | 11 refills | Status: DC

## 2016-02-09 NOTE — Patient Instructions (Addendum)
Please remember to go to the lab  department downstairs in the basement  for your tests - we will call you with the results when they are available.  Ok to leave off 02 if you do just as well off it   The key is to stop smoking completely before smoking completely stops you!   Please schedule a follow up visit in 6 months but call sooner if needed

## 2016-02-09 NOTE — Progress Notes (Signed)
Subjective:    Patient ID: Randall Sawyer, male    DOB: 1955-10-10     MRN: [id number]    Brief patient profile:  49 yowm active smoker first noticed doe x around 2005 first on just albuterol then added spiriva then symbicort then stiolto which works the best so far and referred to pulmonary clinic 05/27/2014 by Dr Dennard Schaumann for copd eval and proved to have a GOLD IV severity  07/23/14 with hypercarbia.   History of Present Illness  05/27/2014 1st [facility] office visit/ [name]   Chief Complaint  Patient presents with  . Pulmonary Consult    referred by Dr. Dennard Schaumann SOB x 1 year. C/o of SOB, fluid retention, prod cough with yellow thick mucus.  Wheezing late in the evenings. Left sided abdominal pain.   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                                      spiriva 2 puffs each am  Plan B = Back up - Only use your albuterol as a rescue medication  Plan C = crisis - Your albuterol nebulizer can be used up to every  4 hours with the goal of not needing at all eventually  Prednisone 10 mg take  4 each am x 2 days,   2 each am x 2 days,  1 each am x 2 days and stop      06/11/2014 f/u ov/[name] re: copd still smoking   Chief Complaint  Patient presents with  . Follow-up    fluid retention not as bad; cough; chest tightness;states here for paperwork;  last used ventolin 1 hours - has not tried rechallenge/ sleeps ok  Just walking to mb makes him stop one  half way going both ways though uphill to it.  Not needing neb as much  No noct or early am exac  Has not worked since 05/27/14 rec Plan A =  Automatic =  Symbicort 160 Take 2 puffs first thing in am and then another 2 puffs about 12 hours later.                                      spiriva 2 puffs each am     Plan B = Back up - Only use your albuterol as a rescue medication Plan C = crisis - Your albuterol nebulizer can be used up to every  4 hours with the goal of not needing at all eventually  The key is to stop smoking completely before smoking completely stops you!    03/03/2015  f/u ov/[name] re: copd IV/ still smoking/ maint on symb/incruse /  02 1lpm Chief Complaint  Patient presents with  . Follow-up    Breathing is unchanged. He is still using proair 1-2 x daily and has not needed neb. He states "down to 6 cigs a day".   rec Try BEVESPI  Take 2 puffs first thing in am and then another 2 puffs about 12 hours later and stop symbicort/incruse  Try E- cigarettes as one way bridge off all tobacco products     08/31/2015  f/u ov/[name] re: copd gold iv/ bevespi 2bid / 1lpm hs but still smoking  Chief Complaint  Patient presents with  . Follow-up    Breathing is unchanged. He is using proair 2-3 x per day "because it's been so hot". He has not used neb.   mb and back s stopping slow pace/ no 02 Rare noct cough/congestion maybe once a week    rec The key is to stop smoking completely before smoking completely stops you!  Start spironolactone /hct 25/25 twice daily and only use lasix as needed for excess swelling     11/09/2015  f/u ov/[name] re: copd IV / still smoking on bevespi 2 bid / lasix/aldatazide daily 1lpm hs  Chief Complaint  Patient presents with  . Follow-up    Breathing is "pretty good" he is using proair 2 x per wk on average and has not needed neb. No new co's today.   doe = MMRC3 = can't walk 100 yards even at a slow pace at a flat grade s stopping due to sob    rec Change lasix to where you take 40 mg daily and aldactazide 25-25 one of each  daily  If the swelling gets worse, take the pm dose  of each one  The key is to stop smoking completely before smoking completely stops you!      02/09/2016  f/u ov/[name] re: GOLD IV still smoking 6 per day/ bevespi 2 bid / lasix /aldactazide/ 1 lpm hs  Chief Complaint  Patient presents with  . Follow-up    Breathing is overall doing well. He states had PNA back in Dec 2017.  He is using proair once daily on average and has not needed to use neb.   no change doe = mmrc3 / leg swelling completely resolved no better on vs off 02     No obvious other day to day or daytime variabilty or assoc excess/ purulent sputum production or cp or chest tightness, subjective wheeze overt sinus or hb symptoms. No unusual exp hx or h/o childhood pna/ asthma or knowledge of premature birth.  Sleeping ok without nocturnal  or early am exacerbation  of respiratory  c/o's or need for noct saba. Also denies any obvious fluctuation of symptoms with weather or environmental changes or other aggravating or alleviating factors except as outlined above   Current Medications, Allergies, Complete Past Medical History, Past Surgical History, Family History, and Social History were reviewed in [employer] record.  ROS  The following are not active complaints unless bolded sore throat, dysphagia, dental problems, itching, sneezing,  nasal congestion or excess/ purulent secretions, ear ache,   fever, chills, sweats, unintended wt loss, pleuritic or exertional cp, hemoptysis,  orthopnea pnd or leg swelling  , presyncope, palpitations, heartburn, abdominal pain, anorexia, nausea, vomiting, diarrhea  or change in bowel or urinary habits, change in stools or urine, dysuria,hematuria,  rash, arthralgias, visual complaints, headache, numbness weakness or ataxia or problems with walking or coordination,  change in mood/affect or  memory.        Objective:   Physical Exam  amb wm nad gruff voice   06/11/2014           179 >  07/23/2014   184 > 09/03/2014 185 > 12/01/2014  196 > 03/03/2015  190 > 08/31/2015     192 > 09/28/2015  195  > 11/09/2015   186  > 02/09/2016     189    05/27/14 180 lb (81.647 kg)  05/16/14 176 lb (79.833 kg)  05/09/14 180 lb (81.647 kg)    Vital signs reviewed  - note   100%   RA on arrival   HEENT mild turbinate edema.  Edentulous/ Oropharynx no thrush or excess pnd or cobblestoning.  No JVD or cervical adenopathy. Mild accessory muscle hypertrophy. Trachea midline, nl thryroid. Chest was slt barrel shaped and hyperinflated by percussion with diminished breath sounds and moderate increased exp time without wheezes or rhonchi.  Hoover sign positive at mid inspiration. Regular rate and rhythm without murmur gallop or rub or increase P2-  Edema resolved  Abd: no hsm, nl excursion. Ext warm without cyanosis or clubbing.         Chemistry      Component Value Date/Time   NA 136 02/09/2016 0935   K 4.1 02/09/2016 0935   CL 93 (L) 02/09/2016 0935   CO2 40 (H) 02/09/2016 0935   BUN 24 (H) 02/09/2016 0935   CREATININE 1.44 02/09/2016 0935   CREATININE 0.94 11/03/2014 0000      Component Value Date/Time   CALCIUM 9.8 02/09/2016 0935   ALKPHOS 111 08/31/2015 0935   AST 17 08/31/2015 0935   ALT 17 08/31/2015 0935   BILITOT 0.4 08/31/2015 0935                           Assessment & Plan:     Outpatient Encounter Prescriptions as of 02/09/2016  Medication Sig  . albuterol (PROVENTIL) (2.5 MG/3ML) 0.083% nebulizer solution Take 3 mLs (2.5 mg total) by nebulization every 6 (six) hours as needed for wheezing or shortness of breath.  . furosemide (LASIX) 40 MG tablet TAKE 1 AND 1/2 TABLET BY MOUTH EVERY DAY  . Glycopyrrolate-Formoterol (BEVESPI) 9-4.8 MCG/ACT AERO Inhale 2 puffs into the lungs 2 (two) times daily. Take 2 puffs first thing in am and then another 2 puffs about 12 hours  later.  . OXYGEN Inhale 1 L into the lungs at bedtime.  . pantoprazole (PROTONIX) 40 MG tablet TAKE 1 TABLET(40 MG) BY MOUTH DAILY BEFORE BREAKFAST  . PROAIR HFA 108 (90 Base) MCG/ACT inhaler INHALE 2 PUFFS BY MOUTH EVERY 4 HOURS AS NEEDED  . spironolactone-hydrochlorothiazide (ALDACTAZIDE) 25-25 MG tablet Take 1 tablet by mouth daily.  . [DISCONTINUED] Glycopyrrolate-Formoterol (BEVESPI) 9-4.8 MCG/ACT AERO Inhale 2 puffs into the lungs 2 (two) times daily. Take 2 puffs first thing in am and then another 2 puffs about 12 hours later.   No facility-administered encounter medications on file as of 02/09/2016.

## 2016-02-10 NOTE — Progress Notes (Signed)
Spoke with pt and notified of results per Dr. Wert. Pt verbalized understanding and denied any questions. 

## 2016-02-11 NOTE — Assessment & Plan Note (Signed)
HC03  36  05/16/14 corresponding to a pc02 above 55  - ono RA < 89% x 4 h 11 min > 09/12/2014  rec one liter per min hs and recheck = > done 9/141/6 with < 89% x 89m> no change rx  - HC03  11/09/2015  = 39  - HCO3 02/09/2016    = 40   As of 02/09/2016  02 prn

## 2016-02-11 NOTE — Assessment & Plan Note (Signed)
>   3 min  Despite severe copd/ chronic hypercarbia not willing to commit to quit at this point

## 2016-02-11 NOTE — Assessment & Plan Note (Signed)
-   HC03 36 05/16/14 - 05/27/2014 p extensive coaching HFA effectiveness =    90% > try symbicort/ incruse  - 06/11/14 Spriometry FEV1  0.57 (15%) ratio 34 1 h p last saba  - PFTs 07/23/2014   FEV1  0.84 (25%) ratio 44 p 16% improvement and dlco 65 p am symbicort  -  09/03/2014  Walked RA x 3 laps @ 185 ft each stopped due to  End of study, nl pace, no sob or desat   - 03/03/2015  extensive coaching HFA effectiveness =    90% > try BEVESPI > no better 09/28/2015 but vol overloaded   Very severe and relatively well compensated on bevespi despite still smoking -=  GOLD IV/ B symptoms s exac tendency > no change in rx  I had an extended discussion with the patient reviewing all relevant studies completed to date and  lasting 15 to 20 minutes of a 25 minute visit    Each maintenance medication was reviewed in detail including most importantly the difference between maintenance and prns and under what circumstances the prns are to be triggered using an action plan format that is not reflected in the computer generated alphabetically organized AVS.    Please see AVS for specific instructions unique to this visit that I personally wrote and verbalized to the the pt in detail and then reviewed with pt  by my nurse highlighting any  changes in therapy recommended at today's visit to their plan of care.

## 2016-02-11 NOTE — Assessment & Plan Note (Signed)
Resolved on lasix/aldactzie c/w well compensated cor pulmonale > no change in rx needed

## 2016-02-22 ENCOUNTER — Other Ambulatory Visit: Payer: Self-pay | Admitting: Family Medicine

## 2016-03-22 ENCOUNTER — Other Ambulatory Visit (INDEPENDENT_AMBULATORY_CARE_PROVIDER_SITE_OTHER): Payer: Self-pay | Admitting: Internal Medicine

## 2016-03-22 ENCOUNTER — Other Ambulatory Visit: Payer: Self-pay | Admitting: Cardiology

## 2016-05-02 ENCOUNTER — Other Ambulatory Visit: Payer: Self-pay | Admitting: Cardiology

## 2016-05-10 ENCOUNTER — Telehealth (INDEPENDENT_AMBULATORY_CARE_PROVIDER_SITE_OTHER): Payer: Self-pay | Admitting: Internal Medicine

## 2016-05-10 NOTE — Telephone Encounter (Signed)
Patient called, would like a refill on Pantoprazole  [phone]

## 2016-05-11 ENCOUNTER — Telehealth (INDEPENDENT_AMBULATORY_CARE_PROVIDER_SITE_OTHER): Payer: Self-pay | Admitting: Internal Medicine

## 2016-05-11 MED ORDER — PANTOPRAZOLE SODIUM 40 MG PO TBEC: DELAYED_RELEASE_TABLET | ORAL | 11 refills | Status: DC

## 2016-05-11 NOTE — Telephone Encounter (Signed)
Rx sent to his pharmacy

## 2016-05-11 NOTE — Telephone Encounter (Signed)
refilled 

## 2016-08-24 ENCOUNTER — Telehealth: Payer: Self-pay | Admitting: Internal Medicine

## 2016-08-24 MED ORDER — GLYCOPYRROLATE-FORMOTEROL 9-4.8 MCG/ACT IN AERO: 2.0000 | INHALATION_SPRAY | Freq: Two times a day (BID) | RESPIRATORY_TRACT | 0 refills | Status: DC

## 2016-08-24 NOTE — Telephone Encounter (Signed)
Pt requesting bevespi samples.  These have been left up front for pickup.  Pt aware.  Nothing further needed.

## 2016-09-25 ENCOUNTER — Other Ambulatory Visit: Payer: Self-pay | Admitting: Internal Medicine

## 2016-10-03 ENCOUNTER — Other Ambulatory Visit: Payer: Self-pay | Admitting: Internal Medicine

## 2016-10-06 ENCOUNTER — Telehealth: Payer: Self-pay | Admitting: Internal Medicine

## 2016-10-06 MED ORDER — FUROSEMIDE 40 MG PO TABS: 60.0000 mg | ORAL_TABLET | Freq: Every day | ORAL | 0 refills | Status: DC

## 2016-10-06 NOTE — Telephone Encounter (Signed)
Rx has been sent to preferred pharmacy. Pt has been scheduled for OV for 11/15/16, as pt does not currently have insurance. Nothing further needed at this time.

## 2016-10-06 NOTE — Telephone Encounter (Signed)
Ok x one month but then ov with all meds in hand before any more refills so we can sort out who is doing what

## 2016-10-06 NOTE — Telephone Encounter (Signed)
furosemide (LASIX) 40 MG tablet [[id number]]  Order Details  Dose, Route, Frequency: As Directed   Dispense Quantity:  45 tablet Refills:  0 Fills remaining:  --        Sig: TAKE 1 AND 1/2 TABLET BY MOUTH EVERY DAY       Written Date:  03/22/16 Expiration Date:  03/22/17    Start Date:  03/22/16 End Date:  --         Ordering Provider:  Satira Sark, MD DEA #:  [id number] NPI:  [id number]      We did not write for this Rx. He has to call the doctor who prescribed this meduication. Spoke with pt, he states he thought MW was writing this Rx but I did not see it on file. Can we refill MW?

## 2016-10-07 ENCOUNTER — Encounter (INDEPENDENT_AMBULATORY_CARE_PROVIDER_SITE_OTHER): Payer: Self-pay | Admitting: Internal Medicine

## 2016-11-04 ENCOUNTER — Other Ambulatory Visit: Payer: Self-pay | Admitting: Internal Medicine

## 2016-11-15 ENCOUNTER — Encounter: Payer: Self-pay | Admitting: Internal Medicine

## 2016-11-15 ENCOUNTER — Ambulatory Visit: Payer: Medicare Other | Admitting: Internal Medicine

## 2016-11-15 ENCOUNTER — Other Ambulatory Visit (INDEPENDENT_AMBULATORY_CARE_PROVIDER_SITE_OTHER): Payer: Medicare Other

## 2016-11-15 VITALS — BP 134/76 | HR 97 | Ht 69.0 in | Wt 198.8 lb

## 2016-11-15 DIAGNOSIS — R600 Localized edema: Secondary | ICD-10-CM

## 2016-11-15 DIAGNOSIS — F17210 Nicotine dependence, cigarettes, uncomplicated: Secondary | ICD-10-CM

## 2016-11-15 DIAGNOSIS — J9611 Chronic respiratory failure with hypoxia: Secondary | ICD-10-CM | POA: Diagnosis not present

## 2016-11-15 DIAGNOSIS — J9612 Chronic respiratory failure with hypercapnia: Secondary | ICD-10-CM

## 2016-11-15 DIAGNOSIS — J449 Chronic obstructive pulmonary disease, unspecified: Secondary | ICD-10-CM

## 2016-11-15 LAB — CBC WITH DIFFERENTIAL/PLATELET
BASOS PCT: 1.6 % (ref 0.0–3.0)
Basophils Absolute: 0.1 10*3/uL (ref 0.0–0.1)
EOS PCT: 4.5 % (ref 0.0–5.0)
Eosinophils Absolute: 0.4 10*3/uL (ref 0.0–0.7)
HCT: 46.7 % (ref 39.0–52.0)
HEMOGLOBIN: 15.5 g/dL (ref 13.0–17.0)
LYMPHS ABS: 1.4 10*3/uL (ref 0.7–4.0)
LYMPHS PCT: 17.3 % (ref 12.0–46.0)
MCHC: 33.2 g/dL (ref 30.0–36.0)
MCV: 98.1 fl (ref 78.0–100.0)
MONO ABS: 0.7 10*3/uL (ref 0.1–1.0)
MONOS PCT: 8.8 % (ref 3.0–12.0)
NEUTROS ABS: 5.4 10*3/uL (ref 1.4–7.7)
Neutrophils Relative %: 67.8 % (ref 43.0–77.0)
Platelets: 245 10*3/uL (ref 150.0–400.0)
RBC: 4.76 Mil/uL (ref 4.22–5.81)
RDW: 15.3 % (ref 11.5–15.5)
WBC: 7.9 10*3/uL (ref 4.0–10.5)

## 2016-11-15 LAB — BASIC METABOLIC PANEL
BUN: 16 mg/dL (ref 6–23)
CHLORIDE: 93 meq/L — AB (ref 96–112)
CO2: 36 meq/L — AB (ref 19–32)
CREATININE: 1.29 mg/dL (ref 0.40–1.50)
Calcium: 9.6 mg/dL (ref 8.4–10.5)
GFR: 59.81 mL/min — ABNORMAL LOW (ref >60.00)
Glucose, Bld: 97 mg/dL (ref 70–99)
Potassium: 4.4 mEq/L (ref 3.5–5.1)
Sodium: 136 mEq/L (ref 135–145)

## 2016-11-15 MED ORDER — GLYCOPYRROLATE-FORMOTEROL 9-4.8 MCG/ACT IN AERO: 2.0000 | INHALATION_SPRAY | Freq: Two times a day (BID) | RESPIRATORY_TRACT | 11 refills | Status: DC

## 2016-11-15 NOTE — Progress Notes (Signed)
ATC, NA and no option to leave msg 

## 2016-11-15 NOTE — Patient Instructions (Signed)
Please remember to go to the lab department downstairs in the basement  for your tests - we will call you with the results when they are available.      Work on inhaler technique:  relax and gently blow all the way out then take a nice smooth deep breath back in, triggering the inhaler at same time you start breathing in.  Hold for up to 5 seconds if you can. Blow out thru nose. Rinse and gargle with water when done     The key is to stop smoking completely before smoking completely stops you!   Please schedule a follow up visit in 3 months but call sooner if needed

## 2016-11-15 NOTE — Progress Notes (Signed)
Subjective:    Patient ID: Randall Sawyer, male    DOB: 1955-10-10     MRN: [id number]    Brief patient profile:  49 yowm active smoker first noticed doe x around 2005 first on just albuterol then added spiriva then symbicort then stiolto which works the best so far and referred to pulmonary clinic 05/27/2014 by Dr Dennard Schaumann for copd eval and proved to have a GOLD IV severity  07/23/14 with hypercarbia.   History of Present Illness  05/27/2014 1st [facility] office visit/ Kirby Argueta   Chief Complaint  Patient presents with  . Pulmonary Consult    referred by Dr. Dennard Schaumann SOB x 1 year. C/o of SOB, fluid retention, prod cough with yellow thick mucus.  Wheezing late in the evenings. Left sided abdominal pain.   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                                      spiriva 2 puffs each am  Plan B = Back up - Only use your albuterol as a rescue medication  Plan C = crisis - Your albuterol nebulizer can be used up to every  4 hours with the goal of not needing at all eventually  Prednisone 10 mg take  4 each am x 2 days,   2 each am x 2 days,  1 each am x 2 days and stop      06/11/2014 f/u ov/Chea Malan re: copd still smoking   Chief Complaint  Patient presents with  . Follow-up    fluid retention not as bad; cough; chest tightness;states here for paperwork;  last used ventolin 1 hours - has not tried rechallenge/ sleeps ok  Just walking to mb makes him stop one  half way going both ways though uphill to it.  Not needing neb as much  No noct or early am exac  Has not worked since 05/27/14 rec Plan A =  Automatic =  Symbicort 160 Take 2 puffs first thing in am and then another 2 puffs about 12 hours later.                                      spiriva 2 puffs each am     Plan B = Back up - Only use your albuterol as a rescue medication Plan C = crisis - Your albuterol nebulizer can be used up to every  4 hours with the goal of not needing at all eventually  The key is to stop smoking completely before smoking completely stops you!    03/03/2015  f/u ov/Demoni Gergen re: copd IV/ still smoking/ maint on symb/incruse /  02 1lpm Chief Complaint  Patient presents with  . Follow-up    Breathing is unchanged. He is still using proair 1-2 x daily and has not needed neb. He states "down to 6 cigs a day".   rec Try BEVESPI  Take 2 puffs first thing in am and then another 2 puffs about 12 hours later and stop symbicort/incruse  Try E- cigarettes as one way bridge off all tobacco products     08/31/2015  f/u ov/Symon Norwood re: copd gold iv/ bevespi 2bid / 1lpm hs but still smoking  Chief Complaint  Patient presents with  . Follow-up    Breathing is unchanged. He is using proair 2-3 x per day "because it's been so hot". He has not used neb.   mb and back s stopping slow pace/ no 02 Rare noct cough/congestion maybe once a week    rec The key is to stop smoking completely before smoking completely stops you!  Start spironolactone /hct 25/25 twice daily and only use lasix as needed for excess swelling     11/09/2015  f/u ov/Lakyia Behe re: copd IV / still smoking on bevespi 2 bid / lasix/aldatazide daily 1lpm hs  Chief Complaint  Patient presents with  . Follow-up    Breathing is "pretty good" he is using proair 2 x per wk on average and has not needed neb. No new co's today.   doe = MMRC3 = can't walk 100 yards even at a slow pace at a flat grade s stopping due to sob    rec Change lasix to where you take 40 mg daily and aldactazide 25-25 one of each  daily  If the swelling gets worse, take the pm dose  of each one  The key is to stop smoking completely before smoking completely stops you!      02/09/2016  f/u ov/Espn Zeman re: GOLD IV still smoking 6 per day/ bevespi 2 bid / lasix /aldactazide/ 1 lpm hs  Chief Complaint  Patient presents with  . Follow-up    Breathing is overall doing well. He states had PNA back in Dec 2017.  He is using proair once daily on average and has not needed to use neb.   no change doe = mmrc3 / leg swelling completely resolved no better on vs off 02  rec Please remember to go to the lab  department downstairs in the basement  for your tests - we will call you with the results when they are available. Ok to leave off 02 if you do just as well off it  The key is to stop smoking completely before smoking completely stops you!       11/15/2016  f/u ov/Nikeia Henkes re:  GOLD IV still smoking/ 02 1lpm  Chief Complaint  Patient presents with  . Follow-up    Breathing is doing well. He uses his proair 1-2 x per day on average and has not used neb since last visit here.    doe = MMRC3 = can't walk 100 yards even at a slow pace at a flat grade s stopping due to sob  Leaning on buggy but has stop / worse doe and increase need for saba since stopped bevespi   Sleeps on 1lpm / Pos am cough/ congestion  And sev times a week needs saba noct  No obvious day to day or daytime variability or assoc excess/ purulent sputum or mucus plugs or hemoptysis or cp or chest tightness, subjective wheeze or overt sinus or hb  symptoms. No unusual exp hx or h/o childhood pna/ asthma or knowledge of premature birth.  Sleeping ok flat without nocturnal  or early am exacerbation  of respiratory  c/o's or need for noct saba. Also denies any obvious fluctuation of symptoms with weather or environmental changes or other aggravating or alleviating factors except as  outlined above   Current Allergies, Complete Past Medical History, Past Surgical History, Family History, and Social History were reviewed in [employer] record.  ROS  The following are not active complaints unless bolded Hoarseness, sore throat, dysphagia, dental problems, itching, sneezing,  nasal congestion or discharge of excess mucus or purulent secretions, ear ache,   fever, chills, sweats, unintended wt loss or wt gain, classically pleuritic or exertional cp,  orthopnea pnd or leg swelling, presyncope, palpitations, abdominal pain, anorexia, nausea, vomiting, diarrhea  or change in bowel habits or change in bladder habits, change in stools or change in urine, dysuria, hematuria,  rash, arthralgias, visual complaints, headache, numbness, weakness or ataxia or problems with walking or coordination,  change in mood/affect or memory.        Current Meds  Medication Sig  . furosemide (LASIX) 40 MG tablet TAKE 1 AND 1/2 TABLETS(60 MG) BY MOUTH DAILY  . Glycopyrrolate-Formoterol (BEVESPI) 9-4.8 MCG/ACT AERO Inhale 2 puffs 2 (two) times daily into the lungs. Take 2 puffs first thing in am and then another 2 puffs about 12 hours later.  . OXYGEN Inhale 1 L into the lungs at bedtime.  . pantoprazole (PROTONIX) 40 MG tablet TAKE 1 TABLET(40 MG) BY MOUTH DAILY BEFORE BREAKFAST  . PROAIR HFA 108 (90 Base) MCG/ACT inhaler INHALE 2 PUFFS BY MOUTH EVERY 4 HOURS AS NEEDED  . spironolactone-hydrochlorothiazide (ALDACTAZIDE) 25-25 MG tablet Take 1 tablet by mouth daily.  .      .                     Objective:   Physical Exam  amb wm nad gruff voice   06/11/2014           179 >  07/23/2014   184 > 09/03/2014 185 > 12/01/2014  196 > 03/03/2015  190 > 08/31/2015     192 > 09/28/2015  195  > 11/09/2015   186  > 02/09/2016     189> 11/15/2016  198     05/27/14 180 lb (81.647 kg)  05/16/14 176 lb (79.833 kg)  05/09/14 180 lb (81.647 kg)    Vital signs reviewed  - note 90%   RA on  arrival     Full dentures    HEENT: nl   turbinates bilaterally, and oropharynx. Nl external ear canals without cough reflex- full dentures    NECK :  without JVD/Nodes/TM/ nl carotid upstrokes bilaterally   LUNGS: no acc muscle use,  slt barrel contour chest, bilateral  Distant bs s wheeze    CV:  RRR  no s3 or murmur or increase in P2, and R > L edema /elastic hose   ABD:  soft and nontender with nl inspiratory excursion in the supine position. No bruits or organomegaly appreciated, bowel sounds nl  MS:  Nl gait/ ext warm without deformities, calf tenderness, cyanosis or clubbing No obvious joint restrictions   SKIN: warm and dry without lesions    NEURO:  alert, approp, nl sensorium with  no motor or cerebellar deficits apparent.      Labs ordered/ reviewed:      Chemistry  Component Value Date/Time   NA 136 11/15/2016 1024   K 4.4 11/15/2016 1024   CL 93 (L) 11/15/2016 1024   CO2 36 (H) 11/15/2016 1024   BUN 16 11/15/2016 1024   CREATININE 1.29 11/15/2016 1024   CREATININE 0.94 11/03/2014 0000      Component Value Date/Time   CALCIUM 9.6 11/15/2016 1024   ALKPHOS 111 08/31/2015 0935   AST 17 08/31/2015 0935   ALT 17 08/31/2015 0935   BILITOT 0.4 08/31/2015 0935        Lab Results  Component Value Date   WBC 7.9 11/15/2016   HGB 15.5 11/15/2016   HCT 46.7 11/15/2016   MCV 98.1 11/15/2016   PLT 245.0 11/15/2016                         Assessment & Plan:

## 2016-11-16 NOTE — Progress Notes (Signed)
ATC, NA and no option to leave msg 

## 2016-11-17 ENCOUNTER — Telehealth: Payer: Self-pay | Admitting: Internal Medicine

## 2016-11-17 ENCOUNTER — Encounter: Payer: Self-pay | Admitting: Internal Medicine

## 2016-11-17 NOTE — Assessment & Plan Note (Signed)
Venous dopplers 10/02/2015 > neg bilaterally   Labs ok/ likely relatively well compensated cor pulmonale > no change rx needed

## 2016-11-17 NOTE — Telephone Encounter (Signed)
Randall Rockers, MD sent to [name], [name]        Call patient : Studies are unremarkable, no change in recs   Spoke with pt and notified of results per Dr. [name]. Pt verbalized understanding and denied any questions.

## 2016-11-17 NOTE — Assessment & Plan Note (Signed)
>   3 m dis  Matter of life or breath > still not willing to commit to quit

## 2016-11-17 NOTE — Progress Notes (Signed)
Spoke with pt and notified of results per Dr. Wert. Pt verbalized understanding and denied any questions. 

## 2016-11-17 NOTE — Assessment & Plan Note (Signed)
HC03  36  05/16/14 corresponding to a pc02 above 55  - ono RA < 89% x 4 h 11 min > 09/12/2014  rec one liter per min hs and recheck = > done 9/141/6 with < 89% x 63m > no change rx  - HC03  11/09/2015  = 39  - HCO3 02/09/2016    = 40  - HC03  11/15/2016    = 26   As of 11/15/2016  02 1lpm hs only

## 2016-11-17 NOTE — Assessment & Plan Note (Signed)
-   HC03 36 05/16/14 - 05/27/2014 p extensive coaching HFA effectiveness =    90% > try symbicort/ incruse  - 06/11/14 Spriometry FEV1  0.57 (15%) ratio 34 1 h p last saba  - PFTs 07/23/2014   FEV1  0.84 (25%) ratio 44 p 16% improvement and dlco 65 p am symbicort  -  09/03/2014  Walked RA x 3 laps @ 185 ft each stopped due to  End of study, nl pace, no sob or desat   - 03/03/2015  try BEVESPI > no better 09/28/2015 but vol overloaded    11/15/2016  After extensive coaching HFA effectiveness =    90%   Pt is Group B in terms of symptom/risk and laba/lama therefore appropriate rx at this point => continue bevespi 2bid   I had an extended discussion with the patient reviewing all relevant studies completed to date and  lasting 15 to 20 minutes of a 25 minute visit    Each maintenance medication was reviewed in detail including most importantly the difference between maintenance and prns and under what circumstances the prns are to be triggered using an action plan format that is not reflected in the computer generated alphabetically organized AVS.    Please see AVS for specific instructions unique to this visit that I personally wrote and verbalized to the the pt in detail and then reviewed with pt  by my nurse highlighting any  changes in therapy recommended at today's visit to their plan of care.

## 2016-11-22 ENCOUNTER — Ambulatory Visit (INDEPENDENT_AMBULATORY_CARE_PROVIDER_SITE_OTHER): Payer: Medicare Other | Admitting: Family Medicine

## 2016-11-22 ENCOUNTER — Encounter: Payer: Self-pay | Admitting: Family Medicine

## 2016-11-22 VITALS — BP 130/72 | HR 46 | Temp 98.3°F | Resp 18 | Ht 70.0 in | Wt 203.0 lb

## 2016-11-22 DIAGNOSIS — M7022 Olecranon bursitis, left elbow: Secondary | ICD-10-CM

## 2016-11-22 NOTE — Progress Notes (Signed)
Subjective:    Patient ID: Randall Sawyer, male    DOB: 29-May-1955, 61 y.o.   MRN: [id number]  HPI Patient presents with painless swelling of the olecranon bursa of his left elbow.  Swelling has been present for more than 2 months.  It is extremely large.  It is approximately 9 cm x 5 cm.  It is elliptical shaped which is unusual.  However it center is over the olecranon process.  There is no erythema or warmth.  There is very minimal tenderness to palpation.  However the patient would like it drained due to comfort as it is constantly in the way. Past Medical History:  Diagnosis Date  . Asthma   . COPD (chronic obstructive pulmonary disease) ([place])   . Hypertension   . Nephrolithiasis   . Tobacco abuse    No past surgical history on file. Current Outpatient Medications on File Prior to Visit  Medication Sig Dispense Refill  . albuterol (PROVENTIL) (2.5 MG/3ML) 0.083% nebulizer solution Take 3 mLs (2.5 mg total) by nebulization every 6 (six) hours as needed for wheezing or shortness of breath. 150 mL 1  . furosemide (LASIX) 40 MG tablet TAKE 1 AND 1/2 TABLETS(60 MG) BY MOUTH DAILY 45 tablet 0  . Glycopyrrolate-Formoterol (BEVESPI) 9-4.8 MCG/ACT AERO Inhale 2 puffs 2 (two) times daily into the lungs. Take 2 puffs first thing in am and then another 2 puffs about 12 hours later. 1 Inhaler 11  . OXYGEN Inhale 1 L into the lungs at bedtime.    . pantoprazole (PROTONIX) 40 MG tablet TAKE 1 TABLET(40 MG) BY MOUTH DAILY BEFORE BREAKFAST 30 tablet 11  . PROAIR HFA 108 (90 Base) MCG/ACT inhaler INHALE 2 PUFFS BY MOUTH EVERY 4 HOURS AS NEEDED 8.5 g 0  . spironolactone-hydrochlorothiazide (ALDACTAZIDE) 25-25 MG tablet Take 1 tablet by mouth daily. 60 tablet 11   No current facility-administered medications on file prior to visit.    No Known Allergies Social History   Socioeconomic History  . Marital status: Divorced    Spouse name: Not on file  . Number of children: Not on file  . Years of  education: Not on file  . Highest education level: Not on file  Social Needs  . Financial resource strain: Not on file  . Food insecurity - worry: Not on file  . Food insecurity - inability: Not on file  . Transportation needs - medical: Not on file  . Transportation needs - non-medical: Not on file  Occupational History  . Occupation: Clinical biochemist   Tobacco Use  . Smoking status: Current Every Day Smoker    Packs/day: 1.00    Types: Cigarettes    Start date: 12/30/1964  . Smokeless tobacco: Never Used  . Tobacco comment: 1 pack x 50 yrs.   Substance and Sexual Activity  . Alcohol use: No    Alcohol/week: 0.0 oz  . Drug use: No  . Sexual activity: Yes    Partners: Female  Other Topics Concern  . Not on file  Social History Narrative  . Not on file      Review of Systems  All other systems reviewed and are negative.      Objective:   Physical Exam  Cardiovascular: Normal rate, regular rhythm and normal heart sounds.  Pulmonary/Chest: Effort normal and breath sounds normal.  Musculoskeletal:       Left elbow: He exhibits swelling and deformity. No tenderness found. No radial head, no medial epicondyle, no lateral epicondyle and  no olecranon process tenderness noted.       Arms:         Assessment & Plan:  Olecranon bursitis of left elbow  Using sterile technique after the area was cleaned thoroughly with Betadine, I anesthetized the skin with 0.1% lidocaine with epinephrine.  I then introduced an 18-gauge needle into the olecranon bursa.  Using 2 separate syringes, I was able to extract approximately 60 cc of bloody serous fluid.  The elbow was then covered with a pressure dressing and the patient was instructed to leave the dressing in place for 72 hours.  Recheck immediately if signs of an infection develop or if recurrent

## 2016-11-22 NOTE — Addendum Note (Signed)
Addended by: Shary Decamp B on: 11/22/2016 03:27 PM   Modules accepted: Orders

## 2016-11-28 LAB — ANAEROBIC AND AEROBIC CULTURE
AER RESULT:: NO GROWTH
MICRO NUMBER: 81279013
MICRO NUMBER: 81279014
SPECIMEN QUALITY: ADEQUATE
SPECIMEN QUALITY:: ADEQUATE

## 2016-11-29 ENCOUNTER — Encounter: Payer: Self-pay | Admitting: Family Medicine

## 2016-11-29 ENCOUNTER — Ambulatory Visit (INDEPENDENT_AMBULATORY_CARE_PROVIDER_SITE_OTHER): Payer: Medicare Other | Admitting: Family Medicine

## 2016-11-29 VITALS — BP 142/60 | HR 98 | Temp 98.1°F | Resp 20 | Ht 70.0 in | Wt 204.0 lb

## 2016-11-29 DIAGNOSIS — M7022 Olecranon bursitis, left elbow: Secondary | ICD-10-CM | POA: Diagnosis not present

## 2016-11-29 NOTE — Progress Notes (Signed)
Subjective:    Patient ID: Randall Sawyer, male    DOB: [birth year], 61 y.o.   MRN: [id number]  HPI   11/22/16 Patient presents with painless swelling of the olecranon bursa of his left elbow.  Swelling has been present for more than 2 months.  It is extremely large.  It is approximately 9 cm x 5 cm.  It is elliptical shaped which is unusual.  However it center is over the olecranon process.  There is no erythema or warmth.  There is very minimal tenderness to palpation.  However the patient would like it drained due to comfort as it is constantly in the way.  At that time, my plan was: Using sterile technique after the area was cleaned thoroughly with Betadine, I anesthetized the skin with 0.1% lidocaine with epinephrine.  I then introduced an 18-gauge needle into the olecranon bursa.  Using 2 separate syringes, I was able to extract approximately 60 cc of bloody serous fluid.  The elbow was then covered with a pressure dressing and the patient was instructed to leave the dressing in place for 72 hours.  Recheck immediately if signs of an infection develop or if recurrent  11/29/16 Shortly after removing his pressure dressing, the fluid reaccumulated in his olecranon bursa.  It is just as large today as it was previously.  Fluid culture obtained the initial visit revealed no evidence of infection.  He is here today for treatment options. Past Medical History:  Diagnosis Date  . Asthma   . COPD (chronic obstructive pulmonary disease) ([place])   . Hypertension   . Nephrolithiasis   . Tobacco abuse    Past Surgical History:  Procedure Laterality Date  . BIOPSY N/A 02/26/2015   Procedure: BIOPSY;  Surgeon: Rogene Houston, MD;  Location: [facility];  Service: Endoscopy;  Laterality: N/A;  Gastric biopsies  . ESOPHAGOGASTRODUODENOSCOPY N/A 11/19/2014   Procedure: ESOPHAGOGASTRODUODENOSCOPY (EGD);  Surgeon: Rogene Houston, MD;  Location: [facility];  Service: Endoscopy;  Laterality: N/A;   2:15-rescheduled to 11/9 @1 :75 Ann notified pt  . ESOPHAGOGASTRODUODENOSCOPY N/A 02/26/2015   Procedure: ESOPHAGOGASTRODUODENOSCOPY (EGD);  Surgeon: Rogene Houston, MD;  Location: [facility];  Service: Endoscopy;  Laterality: N/A;  1225   Current Outpatient Medications on File Prior to Visit  Medication Sig Dispense Refill  . albuterol (PROVENTIL) (2.5 MG/3ML) 0.083% nebulizer solution Take 3 mLs (2.5 mg total) by nebulization every 6 (six) hours as needed for wheezing or shortness of breath. 150 mL 1  . furosemide (LASIX) 40 MG tablet TAKE 1 AND 1/2 TABLETS(60 MG) BY MOUTH DAILY 45 tablet 0  . Glycopyrrolate-Formoterol (BEVESPI) 9-4.8 MCG/ACT AERO Inhale 2 puffs 2 (two) times daily into the lungs. Take 2 puffs first thing in am and then another 2 puffs about 12 hours later. 1 Inhaler 11  . OXYGEN Inhale 1 L into the lungs at bedtime.    . pantoprazole (PROTONIX) 40 MG tablet TAKE 1 TABLET(40 MG) BY MOUTH DAILY BEFORE BREAKFAST 30 tablet 11  . PROAIR HFA 108 (90 Base) MCG/ACT inhaler INHALE 2 PUFFS BY MOUTH EVERY 4 HOURS AS NEEDED 8.5 g 0  . spironolactone-hydrochlorothiazide (ALDACTAZIDE) 25-25 MG tablet Take 1 tablet by mouth daily. 60 tablet 11   No current facility-administered medications on file prior to visit.    No Known Allergies Social History   Socioeconomic History  . Marital status: Divorced    Spouse name: Not on file  . Number of children: Not on  file  . Years of education: Not on file  . Highest education level: Not on file  Social Needs  . Financial resource strain: Not on file  . Food insecurity - worry: Not on file  . Food insecurity - inability: Not on file  . Transportation needs - medical: Not on file  . Transportation needs - non-medical: Not on file  Occupational History  . Occupation: Clinical biochemist   Tobacco Use  . Smoking status: Current Every Day Smoker    Packs/day: 1.00    Types: Cigarettes    Start date: 12/30/1964  . Smokeless tobacco: Never Used    . Tobacco comment: 1 pack x 50 yrs.   Substance and Sexual Activity  . Alcohol use: No    Alcohol/week: 0.0 oz  . Drug use: No  . Sexual activity: Yes    Partners: Female  Other Topics Concern  . Not on file  Social History Narrative  . Not on file      Review of Systems  All other systems reviewed and are negative.      Objective:   Physical Exam  Cardiovascular: Normal rate, regular rhythm and normal heart sounds.  Pulmonary/Chest: Effort normal and breath sounds normal.  Musculoskeletal:       Left elbow: He exhibits swelling and deformity. No tenderness found. No radial head, no medial epicondyle, no lateral epicondyle and no olecranon process tenderness noted.       Arms:         Assessment & Plan:  Olecranon bursitis of left elbow There is no evidence of infection.  There is no erythema or warmth or pain.  I gave the option of me aspirating the olecranon bursa again and removing all the fluid then injecting the olecranon bursa with corticosteroids to try to calm any inflammation that may be causing the fluid to reaccumulate and also apply pressure dressing for 48 hours to prevent this from happening.  I explained to the patient that I was not sure that this would work.  The other option would be a referral to orthopedic surgery for a second opinion.  The patient elected to proceed with option #1 despite the fact I am uncertain that it will be curative.  Using sterile technique, after the area was cleaned thoroughly with Betadine, I anesthetized the skin with 0.1% lidocaine with epinephrine.  I then introduced an 18-gauge needle into the olecranon bursa.  Using 2 separate syringes, I was able to extract approximately 45 cc of bloody serous fluid.  The 18-gauge needle was left in place.  The final syringe was removed from the initial needle.  A third syringe containing 1 cc of 40 mg/mL Kenalog was then attached to the 18-gauge needle using sterile technique and then  injected into the olecranon bursa through the 18-gauge needle.  The elbow was then covered with a pressure dressing and the patient was instructed to leave the dressing in place for 72 hours.  Recheck immediately if signs of an infection develop.  If recurrent, I would recommend consultation with orthopedic surgery

## 2016-12-05 ENCOUNTER — Ambulatory Visit (INDEPENDENT_AMBULATORY_CARE_PROVIDER_SITE_OTHER): Payer: BLUE CROSS/BLUE SHIELD | Admitting: Internal Medicine

## 2016-12-05 ENCOUNTER — Encounter (INDEPENDENT_AMBULATORY_CARE_PROVIDER_SITE_OTHER): Payer: Self-pay | Admitting: Internal Medicine

## 2016-12-12 ENCOUNTER — Other Ambulatory Visit: Payer: Self-pay | Admitting: Internal Medicine

## 2017-01-05 ENCOUNTER — Other Ambulatory Visit: Payer: Self-pay | Admitting: Internal Medicine

## 2017-02-16 ENCOUNTER — Ambulatory Visit: Payer: Medicare Other | Admitting: Internal Medicine

## 2017-03-02 ENCOUNTER — Encounter: Payer: Self-pay | Admitting: Internal Medicine

## 2017-03-02 ENCOUNTER — Ambulatory Visit (INDEPENDENT_AMBULATORY_CARE_PROVIDER_SITE_OTHER)
Admission: RE | Admit: 2017-03-02 | Discharge: 2017-03-02 | Disposition: A | Discharge status: Home / Self Care | Payer: Medicare Other | Source: Ambulatory Visit | Attending: Internal Medicine | Admitting: Internal Medicine

## 2017-03-02 ENCOUNTER — Ambulatory Visit: Payer: Medicare Other | Admitting: Internal Medicine

## 2017-03-02 VITALS — BP 144/70 | HR 89 | Ht 70.0 in | Wt 204.8 lb

## 2017-03-02 DIAGNOSIS — J9612 Chronic respiratory failure with hypercapnia: Secondary | ICD-10-CM | POA: Diagnosis not present

## 2017-03-02 DIAGNOSIS — R600 Localized edema: Secondary | ICD-10-CM

## 2017-03-02 DIAGNOSIS — R918 Other nonspecific abnormal finding of lung field: Secondary | ICD-10-CM

## 2017-03-02 DIAGNOSIS — F17210 Nicotine dependence, cigarettes, uncomplicated: Secondary | ICD-10-CM | POA: Diagnosis not present

## 2017-03-02 DIAGNOSIS — J449 Chronic obstructive pulmonary disease, unspecified: Secondary | ICD-10-CM | POA: Diagnosis not present

## 2017-03-02 DIAGNOSIS — J9611 Chronic respiratory failure with hypoxia: Secondary | ICD-10-CM | POA: Diagnosis not present

## 2017-03-02 MED ORDER — ALBUTEROL SULFATE (2.5 MG/3ML) 0.083% IN NEBU: 2.5000 mg | INHALATION_SOLUTION | Freq: Four times a day (QID) | RESPIRATORY_TRACT | 2 refills | Status: DC | PRN

## 2017-03-02 MED ORDER — ALBUTEROL SULFATE (2.5 MG/3ML) 0.083% IN NEBU: 2.5000 mg | INHALATION_SOLUTION | RESPIRATORY_TRACT | 2 refills | Status: DC | PRN

## 2017-03-02 NOTE — Progress Notes (Signed)
LMTCB on preferred phone number listed for patient. 

## 2017-03-02 NOTE — Assessment & Plan Note (Signed)
>   3 min Discussed the risks and costs (both direct and indirect)  of smoking relative to the benefits of quitting but patient unwilling to commit at this point to a specific quit date.       

## 2017-03-02 NOTE — Progress Notes (Signed)
Subjective:    Patient ID: [facility], male    DOB: 01-03-1956     MRN: [id number]    Brief patient profile:  61 yowm active smoker first noticed doe x around 2005 first on just albuterol then added spiriva then symbicort then stiolto which works the best so far and referred to pulmonary clinic 05/27/2014 by Dr Dennard Schaumann for copd eval and proved to have a GOLD IV severity  07/23/14 with hypercarbia.   History of Present Illness  05/27/2014 1st [facility] office visit/ [facility]yer   Chief Complaint  Patient presents with  . Pulmonary Consult    referred by Dr. Dennard Schaumann SOB x 1 year. C/o of SOB, fluid retention, prod cough with yellow thick mucus.  Wheezing late in the evenings. Left sided abdominal pain.   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                                      spiriva 2 puffs each am  Plan B = Back up - Only use your albuterol as a rescue medication  Plan C = crisis - Your albuterol nebulizer can be used up to every  4 hours with the goal of not needing at all eventually  Prednisone 10 mg take  4 each am x 2 days,   2 each am x 2 days,  1 each am x 2 days and stop      06/11/2014 f/u ov/Randall Sawyer re: copd still smoking   Chief Complaint  Patient presents with  . Follow-up    fluid retention not as bad; cough; chest tightness;states here for paperwork;  last used ventolin 1 hours - has not tried rechallenge/ sleeps ok  Just walking to mb makes him stop one  half way going both ways though uphill to it.  Not needing neb as much  No noct or early am exac  Has not worked since 05/27/14 rec Plan A =  Automatic =  Symbicort 160 Take 2 puffs first thing in am and then another 2 puffs about 12 hours later.                                      spiriva 2 puffs each am     Plan B = Back up - Only use your albuterol as a rescue medication Plan C = crisis - Your albuterol nebulizer can be used up to every  4 hours with the goal of not needing at all eventually  The key is to stop smoking completely before smoking completely stops you!        03/02/2017  f/u ov/Randall Sawyer re:  GOLD IV  Better doe on bevespi  Still smoking 4 cigs  per day  Chief Complaint  Patient presents with  . Follow-up    Breathing is unchanged. He is using proair 1-2 x per day on average.   Dyspnea:  No change = MMRC3 = can't walk 100 yards even at a slow pace at a flat grade s stopping due to sob   Cough: lots of am congestin/ clear mucus p 2-3 min each am clear it faily easily  Sleep: propped up close to 30 degrees Feels needs saba by 1 pm p bevespi  at 4 am but this is less than was the case on symb Leg swelling improved some   No obvious day to day or daytime variability or assoc purulent sputum or mucus plugs or hemoptysis or cp or chest tightness, subjective wheeze or overt sinus or hb symptoms. No unusual exposure hx or h/o childhood pna/ asthma or knowledge of premature birth.  Sleeping ok 30 degrees  without nocturnal     exacerbation  of respiratory  c/o's or need for noct saba. Also denies any obvious fluctuation of symptoms with weather or environmental changes or other aggravating or alleviating factors except as outlined above   Current Allergies, Complete Past Medical History, Past Surgical History, Family History, and Social History were reviewed in [employer] record.  ROS  The following are not active complaints unless bolded Hoarseness, sore  throat, dysphagia, dental problems, itching, sneezing,  nasal congestion or discharge of excess mucus or purulent secretions, ear ache,   fever, chills, sweats, unintended wt loss or wt gain, classically pleuritic or exertional cp,  orthopnea pnd or leg swelling, presyncope, palpitations, abdominal pain, anorexia, nausea, vomiting, diarrhea  or change in bowel habits or change in bladder habits, change in stools or change in urine, dysuria, hematuria,  rash, arthralgias, visual complaints, headache, numbness, weakness or ataxia or problems with walking or coordination,  change in mood/affect or memory.        Current Meds  Medication Sig  . albuterol (PROVENTIL) (2.5 MG/3ML) 0.083% nebulizer solution Take 3 mLs (2.5 mg total) by nebulization every 4 (four) hours as needed for wheezing or shortness of breath.  . furosemide (LASIX) 40 MG tablet TAKE 1 AND 1/2 TABLETS BY MOUTH EVERY DAY  . Glycopyrrolate-Formoterol (BEVESPI) 9-4.8 MCG/ACT AERO Inhale 2 puffs 2 (two) times daily into the lungs. Take 2 puffs first thing in am and then another 2 puffs about 12 hours later.  . OXYGEN Inhale 1 L into the lungs at bedtime.  . pantoprazole (PROTONIX) 40 MG tablet TAKE 1 TABLET(40 MG) BY MOUTH DAILY BEFORE BREAKFAST  . PROAIR HFA 108 (90 Base) MCG/ACT inhaler INHALE 2 PUFFS BY MOUTH EVERY 4 HOURS AS NEEDED  . spironolactone-hydrochlorothiazide (ALDACTAZIDE) 25-25 MG tablet Take 1 tablet by mouth daily.  . [  albuterol (PROVENTIL) (2.5 MG/3ML) 0.083% nebulizer solution Take 3 mLs (2.5 mg total) by nebulization every 6 (six) hours as needed for wheezing or shortness of breath.  .                             Objective:   Physical Exam  amb wm nad gruff voice   06/11/2014           179 >  07/23/2014   184 > 09/03/2014 185 > 12/01/2014  196 > 03/03/2015  190 > 08/31/2015     192 > 09/28/2015  195  >  11/09/2015   186  > 02/09/2016     189> 11/15/2016  198 >  03/02/2017  204     05/27/14 180 lb (81.647 kg)    05/16/14 176 lb (79.833 kg)  05/09/14 180 lb (81.647 kg)      Vital signs reviewed - Note on arrival 02 sats  91% on RA       HEENT: nl  turbinates bilaterally, and oropharynx. Nl external ear canals without cough reflex - full dentures    NECK :  without JVD/Nodes/TM/ nl carotid upstrokes bilaterally   LUNGS: no acc muscle use,  Barrel  contour chest wall with bilateral  Distant bs with minimal insp and exp rhonchi  CV:  RRR  no s3 or murmur or increase in P2, and  Pitting edema both legs R> L despite elastic hose   ABD:  soft and nontender with nl inspiratory excursion in the supine position. No bruits or organomegaly appreciated, bowel sounds nl  MS:  Nl gait/ ext warm without deformities, calf tenderness, cyanosis or clubbing No obvious joint restrictions   SKIN: warm and dry without lesions    NEURO:  alert, approp, nl sensorium with  no motor or cerebellar deficits apparent.      CXR PA and Lateral:   03/02/2017 :    I personally reviewed images and agree with radiology impression as follows:   Mild infiltrate right mid lung field. Infiltrate is slightly more prominent than on prior study of 01/01/2016. This could represent recurrent or progressive infiltrate. Follow-up PA and lateral chest x-ray to demonstrate clearing suggested.                 Assessment & Plan:

## 2017-03-02 NOTE — Assessment & Plan Note (Signed)
-   HC03 36 05/16/14 - 05/27/2014 p extensive coaching HFA effectiveness =    90% > try symbicort/ incruse  - 06/11/14 Spriometry FEV1  0.57 (15%) ratio 34 1 h p last saba  - PFTs 07/23/2014   FEV1  0.84 (25%) ratio 44 p 16% improvement and dlco 65 p am symbicort  -  09/03/2014  Walked RA x 3 laps @ 185 ft each stopped due to  End of study, nl pace, no sob or desat   - 03/03/2015  extensive coaching HFA effectiveness =    90% > try BEVESPI > no better 09/28/2015 but vol overloaded    Pt is Group B in terms of symptom/risk and laba/lama therefore appropriate rx at this point with no flares despite continue smoking against med advice > no change rx needed

## 2017-03-02 NOTE — Patient Instructions (Signed)
Please remember to go to the  x-ray department downstairs in the basement  for your tests - we will call you with the results when they are available.    The key is to stop smoking completely before smoking completely stops you!   Please schedule a follow up visit in 3 months but call sooner if needed

## 2017-03-02 NOTE — Assessment & Plan Note (Signed)
HC03  36  05/16/14 corresponding to a pc02 above 55  - ono RA < 89% x 4 h 11 min > 09/12/2014  rec one liter per min hs and recheck = > done 9/141/6 with < 89% x 51m > no change rx  - HC03  11/09/2015  = 39  - HCO3 02/09/2016    = 40  - HC03  11/15/2016    = 36   As of 03/02/2017  02 1lpm hs only

## 2017-03-02 NOTE — Assessment & Plan Note (Signed)
See CXR 03/02/2017 >  HRCT next step  I had an extended discussion with the patient reviewing all relevant studies completed to date and  lasting 15 to 20 minutes of a 25 minute visit    Each maintenance medication was reviewed in detail including most importantly the difference between maintenance and prns and under what circumstances the prns are to be triggered using an action plan format that is not reflected in the computer generated alphabetically organized AVS.    Please see AVS for specific instructions unique to this visit that I personally wrote and verbalized to the the pt in detail and then reviewed with pt  by my nurse highlighting any  changes in therapy recommended at today's visit to their plan of care.

## 2017-03-03 ENCOUNTER — Telehealth: Payer: Self-pay | Admitting: Internal Medicine

## 2017-03-03 DIAGNOSIS — R918 Other nonspecific abnormal finding of lung field: Secondary | ICD-10-CM

## 2017-03-03 DIAGNOSIS — J439 Emphysema, unspecified: Secondary | ICD-10-CM

## 2017-03-03 NOTE — Telephone Encounter (Signed)
Called pt letting him know the results of the cxr and that MW wanted him to have a HRCT to be able to closely look at this based on results of cxr.  Pt expressed understanding. Order placed for HRCT. Nothing further needed at this current time.

## 2017-03-03 NOTE — Progress Notes (Signed)
Attempted to call patient and leave a message but the phone would not allow message to be left will try again later

## 2017-03-16 ENCOUNTER — Ambulatory Visit (HOSPITAL_COMMUNITY)
Admission: RE | Admit: 2017-03-16 | Discharge: 2017-03-16 | Disposition: A | Discharge status: Home / Self Care | Payer: Medicare Other | Source: Ambulatory Visit | Attending: Internal Medicine | Admitting: Internal Medicine

## 2017-03-16 DIAGNOSIS — R918 Other nonspecific abnormal finding of lung field: Secondary | ICD-10-CM | POA: Diagnosis not present

## 2017-03-17 ENCOUNTER — Telehealth: Payer: Self-pay | Admitting: Internal Medicine

## 2017-03-17 NOTE — Telephone Encounter (Signed)
Aware/ see result note

## 2017-03-17 NOTE — Telephone Encounter (Signed)
Erline Levine was calling about call report of CT chest. MW please advise.   IMPRESSION: 1. The finding on the recent chest radiograph corresponds to an expansile predominantly lytic lesion in the anterolateral aspect of the right sixth rib which has a significant soft tissue component. 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 2. In addition, there multiple small pulmonary nodules scattered throughout the lungs bilaterally, largest of which is a mixed solid and sub solid nodule in the medial aspect of the left upper lobe measuring 13 x 7 mm, with a 3 mm central solid component. Follow-up non-contrast CT recommended at 3-6 months to confirm persistence. If unchanged, and solid component remains <6 mm, annual CT is recommended until 5 years of stability has been established. If persistent these nodules should be considered highly suspicious if the solid component of the nodule is 6 mm or greater in size and enlarging. This recommendation follows the consensus statement: Guidelines for Management of Incidental Pulmonary Nodules Detected on CT Images: From the Fleischner Society 2017; Radiology 2017; 284:228-243. 3. Aortic atherosclerosis, in addition to left main and 3 vessel coronary artery disease. Please note that although the presence of coronary artery calcium documents the presence of coronary artery disease, the severity of this disease and any potential stenosis cannot be assessed on this non-gated CT examination. Assessment for potential risk factor modification, dietary therapy or pharmacologic therapy may be warranted, if clinically indicated. 4. There are calcifications of the aortic valve. Echocardiographic correlation for evaluation of potential valvular dysfunction may be warranted if clinically indicated.  These results will be called to the ordering clinician  or representative by the Radiologist Assistant, and communication documented in the PACS or zVision Dashboard.

## 2017-03-22 ENCOUNTER — Other Ambulatory Visit: Payer: Self-pay | Admitting: Internal Medicine

## 2017-03-22 DIAGNOSIS — R918 Other nonspecific abnormal finding of lung field: Secondary | ICD-10-CM

## 2017-03-22 DIAGNOSIS — J449 Chronic obstructive pulmonary disease, unspecified: Secondary | ICD-10-CM

## 2017-03-22 NOTE — Progress Notes (Signed)
Order sent to [facility] for PET

## 2017-03-27 ENCOUNTER — Ambulatory Visit (HOSPITAL_COMMUNITY)
Admission: RE | Admit: 2017-03-27 | Discharge: 2017-03-27 | Disposition: A | Discharge status: Home / Self Care | Payer: Medicare Other | Source: Ambulatory Visit | Attending: Internal Medicine | Admitting: Internal Medicine

## 2017-03-27 DIAGNOSIS — J449 Chronic obstructive pulmonary disease, unspecified: Secondary | ICD-10-CM | POA: Diagnosis not present

## 2017-03-27 DIAGNOSIS — M899 Disorder of bone, unspecified: Secondary | ICD-10-CM | POA: Insufficient documentation

## 2017-03-27 DIAGNOSIS — R918 Other nonspecific abnormal finding of lung field: Secondary | ICD-10-CM | POA: Diagnosis not present

## 2017-03-27 DIAGNOSIS — M9988 Other biomechanical lesions of rib cage: Secondary | ICD-10-CM | POA: Diagnosis not present

## 2017-03-27 LAB — GLUCOSE, CAPILLARY: Glucose-Capillary: 108 mg/dL — ABNORMAL HIGH (ref 65–99)

## 2017-03-27 MED ORDER — FLUDEOXYGLUCOSE F - 18 (FDG) INJECTION
9.3000 | Freq: Once | INTRAVENOUS | Status: AC | PRN
  Administered 2017-03-27: 9.3 via INTRAVENOUS

## 2017-03-28 ENCOUNTER — Encounter: Payer: Self-pay | Admitting: Internal Medicine

## 2017-03-28 ENCOUNTER — Other Ambulatory Visit: Payer: Self-pay | Admitting: Internal Medicine

## 2017-03-28 DIAGNOSIS — M898X9 Other specified disorders of bone, unspecified site: Secondary | ICD-10-CM | POA: Insufficient documentation

## 2017-03-30 ENCOUNTER — Other Ambulatory Visit: Payer: Self-pay | Admitting: Radiology

## 2017-04-03 ENCOUNTER — Encounter (HOSPITAL_COMMUNITY): Payer: Self-pay

## 2017-04-03 ENCOUNTER — Ambulatory Visit (HOSPITAL_COMMUNITY)
Admission: RE | Admit: 2017-04-03 | Discharge: 2017-04-03 | Disposition: A | Discharge status: Home / Self Care | Payer: Medicare Other | Source: Ambulatory Visit | Attending: Internal Medicine | Admitting: Internal Medicine

## 2017-04-03 DIAGNOSIS — Z87442 Personal history of urinary calculi: Secondary | ICD-10-CM | POA: Diagnosis not present

## 2017-04-03 DIAGNOSIS — Z79899 Other long term (current) drug therapy: Secondary | ICD-10-CM | POA: Insufficient documentation

## 2017-04-03 DIAGNOSIS — C413 Malignant neoplasm of ribs, sternum and clavicle: Secondary | ICD-10-CM | POA: Diagnosis not present

## 2017-04-03 DIAGNOSIS — Z833 Family history of diabetes mellitus: Secondary | ICD-10-CM | POA: Diagnosis not present

## 2017-04-03 DIAGNOSIS — I10 Essential (primary) hypertension: Secondary | ICD-10-CM | POA: Insufficient documentation

## 2017-04-03 DIAGNOSIS — M898X9 Other specified disorders of bone, unspecified site: Secondary | ICD-10-CM

## 2017-04-03 DIAGNOSIS — C419 Malignant neoplasm of bone and articular cartilage, unspecified: Secondary | ICD-10-CM | POA: Diagnosis not present

## 2017-04-03 DIAGNOSIS — M899 Disorder of bone, unspecified: Secondary | ICD-10-CM | POA: Diagnosis not present

## 2017-04-03 DIAGNOSIS — Z8249 Family history of ischemic heart disease and other diseases of the circulatory system: Secondary | ICD-10-CM | POA: Diagnosis not present

## 2017-04-03 DIAGNOSIS — Z9889 Other specified postprocedural states: Secondary | ICD-10-CM | POA: Insufficient documentation

## 2017-04-03 DIAGNOSIS — F17210 Nicotine dependence, cigarettes, uncomplicated: Secondary | ICD-10-CM | POA: Insufficient documentation

## 2017-04-03 DIAGNOSIS — J449 Chronic obstructive pulmonary disease, unspecified: Secondary | ICD-10-CM | POA: Insufficient documentation

## 2017-04-03 DIAGNOSIS — Z836 Family history of other diseases of the respiratory system: Secondary | ICD-10-CM | POA: Diagnosis not present

## 2017-04-03 DIAGNOSIS — Z9981 Dependence on supplemental oxygen: Secondary | ICD-10-CM | POA: Insufficient documentation

## 2017-04-03 LAB — CBC
HEMATOCRIT: 47.1 % (ref 39.0–52.0)
Hemoglobin: 15.3 g/dL (ref 13.0–17.0)
MCH: 32.1 pg (ref 26.0–34.0)
MCHC: 32.5 g/dL (ref 30.0–36.0)
MCV: 98.9 fL (ref 78.0–100.0)
Platelets: 292 10*3/uL (ref 150–400)
RBC: 4.76 MIL/uL (ref 4.22–5.81)
RDW: 15.9 % — AB (ref 11.5–15.5)
WBC: 9.8 10*3/uL (ref 4.0–10.5)

## 2017-04-03 LAB — PROTIME-INR
INR: 0.97
Prothrombin Time: 12.8 seconds (ref 11.4–15.2)

## 2017-04-03 MED ORDER — FLUMAZENIL 0.5 MG/5ML IV SOLN
INTRAVENOUS | Status: AC
  Filled 2017-04-03: qty 5

## 2017-04-03 MED ORDER — NALOXONE HCL 0.4 MG/ML IJ SOLN
INTRAMUSCULAR | Status: AC
  Filled 2017-04-03: qty 1

## 2017-04-03 MED ORDER — FENTANYL CITRATE (PF) 100 MCG/2ML IJ SOLN
INTRAMUSCULAR | Status: DC | PRN
  Administered 2017-04-03 (×2): 50 ug via INTRAVENOUS

## 2017-04-03 MED ORDER — LIDOCAINE HCL 1 % IJ SOLN
INTRAMUSCULAR | Status: AC
  Filled 2017-04-03: qty 20

## 2017-04-03 MED ORDER — MIDAZOLAM HCL 2 MG/2ML IJ SOLN
INTRAMUSCULAR | Status: DC | PRN
  Administered 2017-04-03: 0.5 mg via INTRAVENOUS
  Administered 2017-04-03 (×2): 1 mg via INTRAVENOUS

## 2017-04-03 MED ORDER — FENTANYL CITRATE (PF) 100 MCG/2ML IJ SOLN
INTRAMUSCULAR | Status: AC
  Filled 2017-04-03: qty 4

## 2017-04-03 MED ORDER — SODIUM CHLORIDE 0.9 % IV SOLN: INTRAVENOUS | Status: DC

## 2017-04-03 MED ORDER — MIDAZOLAM HCL 2 MG/2ML IJ SOLN
INTRAMUSCULAR | Status: AC
  Filled 2017-04-03: qty 4

## 2017-04-03 NOTE — Sedation Documentation (Signed)
Patient is resting comfortably. 

## 2017-04-03 NOTE — H&P (Signed)
Chief Complaint: Patient was seen in consultation today for right sixth rib lesion.  Referring Physician(s): Wert,Michael B  Supervising Physician: Daryll Brod  Patient Status: [facility] - Out-pt  History of Present Illness: Randall Sawyer is a 62 y.o. male  Hx COPD, asthma, and current active smoker. Followed by Dr. [name] in pulmonary clinic since 2016.  CXR 03/02/2017: 1. Mild infiltrate right mid lung field. Infiltrate is slightly more prominent than on prior study of 01/01/2016. This could represent recurrent or progressive infiltrate. Follow-up PA and lateral chest x-ray to demonstrate clearing suggested.  CT chest 03/16/2017: 1. The finding on the recent chest radiograph corresponds to an expansile predominantly lytic lesion in the anterolateral aspect of the right sixth rib which has a significant soft tissue component. 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 2. In addition, there multiple small pulmonary nodules scattered throughout the lungs bilaterally, largest of which is a mixed solid and sub solid nodule in the medial aspect of the left upper lobe measuring 13 x 7 mm, with a 3 mm central solid component. Follow-up non-contrast CT recommended at 3-6 months to confirm persistence. If unchanged, and solid component remains <6 mm, annual CT is recommended until 5 years of stability has been established. If persistent these nodules should be considered highly suspicious if the solid component of the nodule is 6 mm or greater in size and enlarging. This recommendation follows the consensus statement: Guidelines for Management of Incidental Pulmonary Nodules Detected on CT Images: From the Fleischner Society 2017; Radiology 2017; 284:228-243. 3. Aortic atherosclerosis, in addition to left main and 3 vessel coronary artery disease. Please note that although the presence of coronary  artery calcium documents the presence of coronary artery disease, the severity of this disease and any potential stenosis cannot be assessed on this non-gated CT examination. Assessment for potential risk factor modification, dietary therapy or pharmacologic therapy may be warranted, if clinically indicated. 4. There are calcifications of the aortic valve. Echocardiographic correlation for evaluation of potential valvular dysfunction may be warranted if clinically indicated.  PET 03/27/2017: 1. 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 Recommend biopsy. Metastatic focus is possible but I do not see any primary neoplasm. There also appears to be chondroid matrix. 2. Left upper lobe ground-glass nodule is stable. No obvious hypermetabolism. Other small nodules are unchanged. Recommend surveillance.  IR consulted by Dr. [name] for image-guided right sixth rib lesion biopsy. Patient complains of shortness of breath for months. Denies fever, wheezing, chest pain, palpitations, dizziness, or syncope.  Past Medical History:  Diagnosis Date  . Asthma   . COPD (chronic obstructive pulmonary disease) ([place])   . Hypertension   . Nephrolithiasis   . Tobacco abuse     Past Surgical History:  Procedure Laterality Date  . BIOPSY N/A 02/26/2015   Procedure: BIOPSY;  Surgeon: Rogene Houston, MD;  Location: [facility];  Service: Endoscopy;  Laterality: N/A;  Gastric biopsies  . ESOPHAGOGASTRODUODENOSCOPY N/A 11/19/2014   Procedure: ESOPHAGOGASTRODUODENOSCOPY (EGD);  Surgeon: Rogene Houston, MD;  Location: [facility];  Service: Endoscopy;  Laterality: N/A;  2:15-rescheduled to 11/9 @1 :10 Ann notified pt  . ESOPHAGOGASTRODUODENOSCOPY N/A 02/26/2015   Procedure: ESOPHAGOGASTRODUODENOSCOPY (EGD);  Surgeon: Rogene Houston, MD;  Location: [facility];  Service: Endoscopy;  Laterality: N/A;  1225    Allergies: Patient has no  known allergies.  Medications: Prior to Admission medications   Medication Sig Start Date End Date Taking? Authorizing Provider  albuterol (PROVENTIL HFA;VENTOLIN HFA) 108 (90 Base) MCG/ACT inhaler Inhale 2 puffs into the lungs every 4 (four) hours as needed for wheezing or shortness of breath.   Yes [provider]  albuterol (PROVENTIL) (2.5 MG/3ML) 0.083% nebulizer solution Take 3 mLs (2.5 mg total) by nebulization every 4 (four) hours as needed for wheezing or shortness of breath. 03/02/17  Yes Tanda Rockers, MD  furosemide (LASIX) 40 MG tablet TAKE 1 AND 1/2 TABLETS BY MOUTH EVERY DAY 01/05/17  Yes Tanda Rockers, MD  Glycopyrrolate-Formoterol (BEVESPI) 9-4.8 MCG/ACT AERO Inhale 2 puffs 2 (two) times daily into the lungs. Take 2 puffs first thing in am and then another 2 puffs about 12 hours later. 11/15/16  Yes Tanda Rockers, MD  OXYGEN Inhale 1 L into the lungs at bedtime.   Yes [provider]  pantoprazole (PROTONIX) 40 MG tablet TAKE 1 TABLET(40 MG) BY MOUTH DAILY BEFORE BREAKFAST 05/11/16  Yes Setzer, Rona Ravens, NP     Family History  Problem Relation Age of Onset  . COPD Father   . Heart disease Father   . Asthma Father   . Diabetes type II Mother   . Diabetes Sister     Social History   Socioeconomic History  . Marital status: Divorced    Spouse name: Not on file  . Number of children: Not on file  . Years of education: Not on file  . Highest education level: Not on file  Occupational History  . Occupation: Careers adviser  . Financial resource strain: Not on file  . Food insecurity:    Worry: Not on file    Inability: Not on file  . Transportation needs:    Medical: Not on file    Non-medical: Not on file  Tobacco Use  . Smoking status: Current Every Day Smoker    Packs/day: 1.00    Types: Cigarettes    Start date: 12/30/1964  . Smokeless tobacco: Never Used  . Tobacco comment: 1 pack x 50 yrs.   Substance and Sexual Activity    . Alcohol use: No    Alcohol/week: 0.0 oz  . Drug use: No  . Sexual activity: Yes    Partners: Female  Lifestyle  . Physical activity:    Days per week: Not on file    Minutes per session: Not on file  . Stress: Not on file  Relationships  . Social connections:    Talks on phone: Not on file    Gets together: Not on file    Attends religious service: Not on file    Active member of club or organization: Not on file    Attends meetings of clubs or organizations: Not on file    Relationship status: Not on file  Other Topics Concern  . Not on file  Social History Narrative  . Not on file     Review of Systems: A 12 point ROS discussed and pertinent positives are indicated in the HPI above.  All other systems are negative.  Review of Systems  Constitutional: Negative for activity change and fever.  Respiratory: Positive for shortness of breath. Negative for chest tightness and wheezing.   Cardiovascular: Negative for chest pain and palpitations.  Neurological: Negative for dizziness and syncope.  Psychiatric/Behavioral: Negative for behavioral problems and confusion.    Vital Signs: BP (!) 155/64   Pulse  86   Temp 97.7 F (36.5 C) (Oral)   Ht 5\' 7"  (1.702 m)   Wt 204 lb (92.5 kg)   SpO2 90%   BMI 31.95 kg/m   Physical Exam  Constitutional: He is oriented to person, place, and time. He appears well-developed and well-nourished. No distress.  Cardiovascular: Normal rate, regular rhythm and normal heart sounds.  No murmur heard. Pulmonary/Chest: Effort normal. No respiratory distress. He has wheezes.  Neurological: He is alert and oriented to person, place, and time.  Skin: Skin is warm and dry.  Psychiatric: He has a normal mood and affect. His behavior is normal. Judgment and thought content normal.  Nursing note and vitals reviewed.    MD Evaluation Airway: WNL Heart: WNL Abdomen: WNL Chest/ Lungs: WNL ASA  Classification: 3 Mallampati/Airway Score:  One   Imaging: Ct Chest High Resolution  Result Date: 03/17/2017 CLINICAL DATA:  62 year old male with history of abnormal chest x-ray demonstrating potential infiltrate in the right lung. EXAM: CT CHEST WITHOUT CONTRAST TECHNIQUE: Multidetector CT imaging of the chest was performed following the standard protocol without intravenous contrast. High resolution imaging of the lungs, as well as inspiratory and expiratory imaging, was performed. COMPARISON:  No priors. FINDINGS: Cardiovascular: Heart size is normal. There is no significant pericardial fluid, thickening or pericardial calcification. There is aortic atherosclerosis, as well as atherosclerosis of the great vessels of the mediastinum and the coronary arteries, including calcified atherosclerotic plaque in the left main, left anterior descending, left circumflex and right coronary arteries. Calcifications of the aortic valve. Mediastinum/Nodes: No pathologically enlarged mediastinal or hilar lymph nodes. Please note that accurate exclusion of hilar adenopathy is limited on noncontrast CT scans. Esophagus is unremarkable in appearance. No axillary lymphadenopathy. Lungs/Pleura: Multiple small calcified noncalcified pulmonary nodules are noted throughout the lungs bilaterally. The largest of these is a with mixed solid and sub solid nodule in the left upper lobe (axial image 32 of series 5) measuring 13 x 7 mm, with a central solid component measuring 3 mm (image 32 of series 3). The largest solid nodule is in the right lower lobe measuring 9 x 4 mm (axial image 116 of series 5). Mild diffuse bronchial wall thickening with mild centrilobular and paraseptal emphysema. No consolidative airspace disease. No pleural effusions. Inspiratory and expiratory imaging is unremarkable. Upper Abdomen: Diffuse low attenuation throughout the visualized hepatic parenchyma, compatible with a background of hepatic steatosis. Aortic atherosclerosis. Musculoskeletal:  Expansile predominantly lytic lesion in the anterolateral aspect of the right sixth rib with soft tissue component (axial image 82 of series 3 and coronal image 24 of series 7) measuring 2.9 x 5.0 x 4.0 cm. No other osseous lesions are noted. IMPRESSION: 1. The finding on the recent chest radiograph corresponds to an expansile predominantly lytic lesion in the anterolateral aspect of the right sixth rib which has a significant soft tissue component. 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 2. In addition, there multiple small pulmonary nodules scattered throughout the lungs bilaterally, largest of which is a mixed solid and sub solid nodule in the medial aspect of the left upper lobe measuring 13 x 7 mm, with a 3 mm central solid component. Follow-up non-contrast CT recommended at 3-6 months to confirm persistence. If unchanged, and solid component remains <6 mm, annual CT is recommended until 5 years of stability has been established. If persistent these nodules should be considered highly suspicious if  the solid component of the nodule is 6 mm or greater in size and enlarging. This recommendation follows the consensus statement: Guidelines for Management of Incidental Pulmonary Nodules Detected on CT Images: From the Fleischner Society 2017; Radiology 2017; 284:228-243. 3. Aortic atherosclerosis, in addition to left main and 3 vessel coronary artery disease. Please note that although the presence of coronary artery calcium documents the presence of coronary artery disease, the severity of this disease and any potential stenosis cannot be assessed on this non-gated CT examination. Assessment for potential risk factor modification, dietary therapy or pharmacologic therapy may be warranted, if clinically indicated. 4. There are calcifications of the aortic valve. Echocardiographic correlation for evaluation of  potential valvular dysfunction may be warranted if clinically indicated. These results will be called to the ordering clinician or representative by the Radiologist Assistant, and communication documented in the PACS or zVision Dashboard. Aortic Atherosclerosis (ICD10-I70.0). Electronically Signed   By: Vinnie Langton M.D.   On: 03/17/2017 13:19   Nm Pet Image Initial (pi) Skull Base To Thigh  Result Date: 03/27/2017 CLINICAL DATA:  Initial treatment strategy for right rib lesion. EXAM: NUCLEAR MEDICINE PET SKULL BASE TO THIGH TECHNIQUE: 9.3 mCi F-18 FDG was injected intravenously. Full-ring PET imaging was performed from the skull base to thigh after the radiotracer. CT data was obtained and used for attenuation correction and anatomic localization. Fasting blood glucose: 108 mg/dl Mediastinal blood pool activity: SUV max 2.34 COMPARISON:  Chest CT 03/16/2017 FINDINGS: NECK: No hypermetabolic lymph nodes in the neck. Incidental CT findings: none CHEST: Vague ground-glass opacity in the left upper lobe centrally on image number 16 does not show any hypermetabolism. Stable 4 mm right upper lobe nodule on image number 30. No definite hypermetabolism. Other small scattered sub 4 mm nodules are unchanged. No acute pulmonary findings or pleural effusion. No enlarged or hypermetabolic mediastinal or hilar lymph nodes. Incidental CT findings: none ABDOMEN/PELVIS: No abnormal hypermetabolic activity within the liver, pancreas, adrenal glands, or spleen. No hypermetabolic lymph nodes in the abdomen or pelvis. Incidental CT findings: none SKELETON: Expansile right sixth rib lesion is hypermetabolic with SUV max of 7.06. It could be a metastatic lesion but I do not see any obvious primary neoplasm. I suspect there is chondroid matrix and this could be a chondrosarcoma. Small focus of metabolic activity noted in the spinous process of L5. No obvious lesion. This could be stress reaction. FDG uptake noted in the right  paraspinal muscles in the thoracic area probably lobe related to muscle injury or strain. No muscle lesion is identified. Incidental CT findings: none IMPRESSION: 1. 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 Recommend biopsy. Metastatic focus is possible but I do not see any primary neoplasm. There also appears to be chondroid matrix. 2. Left upper lobe ground-glass nodule is stable. No obvious hypermetabolism. Other small nodules are unchanged. Recommend surveillance. Electronically Signed   By: Marijo Sanes M.D.   On: 03/27/2017 16:08    Labs:  CBC: Recent Labs    11/15/16 1024  WBC 7.9  HGB 15.5  HCT 46.7  PLT 245.0    COAGS: No results for input(s): INR, APTT in the last 8760 hours.  BMP: Recent Labs    11/15/16 1024  NA 136  K 4.4  CL 93*  CO2 36*  GLUCOSE 97  BUN 16  CALCIUM 9.6  CREATININE 1.29    LIVER FUNCTION TESTS: No results for input(s): BILITOT, AST, ALT,  ALKPHOS, PROT, ALBUMIN in the last 8760 hours.  TUMOR MARKERS: No results for input(s): AFPTM, CEA, CA199, CHROMGRNA in the last 8760 hours.  Assessment and Plan:  Right sixth rib lesion. Plan for image-guided rib lesion biopsy today. Patient is NPO.  He does not take blood thinners. Denies fever.  Risks and benefits discussed with the patient including, but not limited to bleeding, infection, damage to adjacent structures or low yield requiring additional tests. All of the patient's questions were answered, patient is agreeable to proceed. Consent signed and in chart.  Thank you for this interesting consult.  I greatly enjoyed meeting Randall Sawyer and look forward to participating in their care.  A copy of this report was sent to the requesting provider on this date.  Electronically Signed: Earley Abide, PA-C 04/03/2017, 10:32 AM   I spent a total of 30 Minutes in face to face in clinical consultation, greater  than 50% of which was counseling/coordinating care for right sixth rib lesion.

## 2017-04-03 NOTE — Discharge Instructions (Addendum)

## 2017-04-03 NOTE — Procedures (Signed)
RIGHT 6TH RIB LESION PET POSITIVE  S/P CT CORE BX  NO COMP STABLE EBL 5CC PATH PENDING FULL REPORT IN PACS

## 2017-04-06 ENCOUNTER — Other Ambulatory Visit: Payer: Self-pay | Admitting: Internal Medicine

## 2017-04-06 DIAGNOSIS — R918 Other nonspecific abnormal finding of lung field: Secondary | ICD-10-CM

## 2017-04-06 NOTE — Progress Notes (Signed)
Order sent to [facility] 

## 2017-04-11 ENCOUNTER — Encounter (HOSPITAL_COMMUNITY): Payer: Self-pay | Admitting: Hematology

## 2017-04-11 ENCOUNTER — Inpatient Hospital Stay (HOSPITAL_COMMUNITY): Payer: Medicare Other | Attending: Hematology | Admitting: Hematology

## 2017-04-11 VITALS — BP 141/49 | HR 90 | Temp 97.8°F | Resp 20 | Ht 67.0 in | Wt 204.5 lb

## 2017-04-11 DIAGNOSIS — I10 Essential (primary) hypertension: Secondary | ICD-10-CM | POA: Diagnosis not present

## 2017-04-11 DIAGNOSIS — Z79899 Other long term (current) drug therapy: Secondary | ICD-10-CM | POA: Diagnosis not present

## 2017-04-11 DIAGNOSIS — F17210 Nicotine dependence, cigarettes, uncomplicated: Secondary | ICD-10-CM

## 2017-04-11 DIAGNOSIS — J449 Chronic obstructive pulmonary disease, unspecified: Secondary | ICD-10-CM | POA: Diagnosis not present

## 2017-04-11 DIAGNOSIS — C413 Malignant neoplasm of ribs, sternum and clavicle: Secondary | ICD-10-CM | POA: Diagnosis not present

## 2017-04-11 DIAGNOSIS — I700 Atherosclerosis of aorta: Secondary | ICD-10-CM | POA: Insufficient documentation

## 2017-04-11 NOTE — Assessment & Plan Note (Signed)
1.  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  PET/CT scan done on 03/27/2017 showed expansile hypermetabolic right sixth anterior rib lesion.  It also showed left upper lobe groundglass nodule which is stable compared to the CT scan from 03/16/2017.  A biopsy on 04/03/2017 confirmed chondrosarcoma of the right sixth rib.  No other evidence of metastatic disease was found on the PET scan.  I would like to make a referral to a sarcoma clinic and get their opinion about surgical resection.  He is agreeable to this option.

## 2017-04-11 NOTE — Progress Notes (Signed)
[facility] NOTE  Patient Care Team: Susy Frizzle, MD as PCP - General (Family Medicine) Satira Sark, MD as Consulting Physician (Cardiology)  CHIEF COMPLAINTS/PURPOSE OF CONSULTATION:  Chondrosarcoma of the right rib.  HISTORY OF PRESENTING ILLNESS:  Randall Sawyer 63 y.o. male is seen in consultation today for newly diagnosed chondrosarcoma of the right rib.  A chest x-ray in February showed a possible lung mass on the right side.  A CT scan on 03/16/2017 showed a lesion on the right sixth rib, with multiple sub-4 mm lung nodules.  PET/CT scan on 03/27/2017 showed expansile hypermetabolic right sixth anterior rib lesion and left upper lobe groundglass nodule.  A CT-guided biopsy was done on 04/03/2017 which was consistent with moderately differentiated chondrosarcoma. He has a 51-pack-year smoking history.  He has noticed right-sided chest wall muscle cramps for the last 6 months on and off.  He denies any significant weight loss.  He wears oxygen at nighttime, 1 L/min via nasal cannula for COPD.  He also takes Lasix for lower extremity swelling.  His last echocardiogram in 2016 shows EF of 55-60%.  He denies any headaches, nausea or vomiting.  MEDICAL HISTORY:  Past Medical History:  Diagnosis Date  . Asthma   . COPD (chronic obstructive pulmonary disease) ([place])   . Hypertension   . Nephrolithiasis   . Tobacco abuse     SURGICAL HISTORY: Past Surgical History:  Procedure Laterality Date  . BIOPSY N/A 02/26/2015   Procedure: BIOPSY;  Surgeon: Rogene Houston, MD;  Location[facility];  Service: Endoscopy;  Laterality: N/A;  Gastric biopsies  . ESOPHAGOGASTRODUODENOSCOPY N/A 11/19/2014   Procedure: ESOPHAGOGASTRODUODENOSCOPY (EGD);  Surgeon: Rogene Houston, MD;  Location: [facility];  Service: Endoscopy;  Laterality: N/A;  2:15-rescheduled to 11/9 @1 :4 Ann notified pt  . ESOPHAGOGASTRODUODENOSCOPY N/A 02/26/2015   Procedure:  ESOPHAGOGASTRODUODENOSCOPY (EGD);  Surgeon: Rogene Houston, MD;  Location: [facility];  Service: Endoscopy;  Laterality: N/A;  1225    SOCIAL HISTORY: Social History   Socioeconomic History  . Marital status: Divorced    Spouse name: Not on file  . Number of children: Not on file  . Years of education: Not on file  . Highest education level: Not on file  Occupational History  . Occupation: Careers adviser  . Financial resource strain: Not on file  . Food insecurity:    Worry: Not on file    Inability: Not on file  . Transportation needs:    Medical: Not on file    Non-medical: Not on file  Tobacco Use  . Smoking status: Current Every Day Smoker    Packs/day: 0.25    Years: 51.00    Pack years: 12.75    Types: Cigarettes    Start date: 12/30/1964  . Smokeless tobacco: Never Used  . Tobacco comment: working on quitting  Substance and Sexual Activity  . Alcohol use: No    Alcohol/week: 0.0 oz  . Drug use: No  . Sexual activity: Yes    Partners: Female  Lifestyle  . Physical activity:    Days per week: Not on file    Minutes per session: Not on file  . Stress: Not on file  Relationships  . Social connections:    Talks on phone: Not on file    Gets together: Not on file    Attends religious service: Not on file    Active member of club or  organization: Not on file    Attends meetings of clubs or organizations: Not on file    Relationship status: Not on file  . Intimate partner violence:    Fear of current or ex partner: Not on file    Emotionally abused: Not on file    Physically abused: Not on file    Forced sexual activity: Not on file  Other Topics Concern  . Not on file  Social History Narrative  . Not on file    FAMILY HISTORY: Family History  Problem Relation Age of Onset  . COPD Father   . Heart disease Father   . Asthma Father   . Diabetes type II Mother   . Diabetes Sister     ALLERGIES:  has No Known Allergies.  MEDICATIONS:   Current Outpatient Medications  Medication Sig Dispense Refill  . albuterol (PROVENTIL HFA;VENTOLIN HFA) 108 (90 Base) MCG/ACT inhaler Inhale 2 puffs into the lungs every 4 (four) hours as needed for wheezing or shortness of breath.    Marland Kitchen albuterol (PROVENTIL) (2.5 MG/3ML) 0.083% nebulizer solution Take 3 mLs (2.5 mg total) by nebulization every 4 (four) hours as needed for wheezing or shortness of breath. 150 mL 2  . furosemide (LASIX) 40 MG tablet TAKE 1 AND 1/2 TABLETS BY MOUTH EVERY DAY 45 tablet 6  . Glycopyrrolate-Formoterol (BEVESPI) 9-4.8 MCG/ACT AERO Inhale 2 puffs 2 (two) times daily into the lungs. Take 2 puffs first thing in am and then another 2 puffs about 12 hours later. 1 Inhaler 11  . OXYGEN Inhale 1 L into the lungs at bedtime.    . pantoprazole (PROTONIX) 40 MG tablet TAKE 1 TABLET(40 MG) BY MOUTH DAILY BEFORE BREAKFAST 30 tablet 11   No current facility-administered medications for this visit.     REVIEW OF SYSTEMS:   Constitutional: Denies fevers, chills or abnormal night sweats Eyes: Denies blurriness of vision, double vision or watery eyes Ears, nose, mouth, throat, and face: Denies mucositis or sore throat Respiratory: Complains of shortness of breath on exertion.   Cardiovascular: Denies palpitation, chest discomfort positive lower extremity swelling Gastrointestinal:  Denies nausea, heartburn or change in bowel habits Skin: Denies abnormal skin rashes Lymphatics: Denies new lymphadenopathy or easy bruising Neurological:Denies numbness, tingling or new weaknesses Behavioral/Psych: Mood is stable, no new changes  All other systems were reviewed with the patient and are negative.  PHYSICAL EXAMINATION: ECOG PERFORMANCE STATUS: 1 - Symptomatic but completely ambulatory  Vitals:   04/11/17 0910  BP: (!) 141/49  Pulse: 90  Resp: 20  Temp: 97.8 F (36.6 C)  SpO2: 91%   Filed Weights   04/11/17 0910  Weight: 204 lb 8 oz (92.8 kg)    GENERAL:alert, no  distress and comfortable SKIN: skin color, texture, turgor are normal, no rashes or significant lesions EYES: normal, conjunctiva are pink and non-injected, sclera clear OROPHARYNX:no exudate, no erythema and lips, buccal mucosa, and tongue normal  NECK: supple, thyroid normal size, non-tender, without nodularity LYMPH:  no palpable lymphadenopathy in the cervical, axillary or inguinal LUNGS: clear to auscultation and percussion with normal breathing effort HEART: regular rate & rhythm and no murmurs and no lower extremity edema ABDOMEN:abdomen soft, non-tender and normal bowel sounds Musculoskeletal:no cyanosis of digits and no clubbing, edema1+ PSYCH: alert & oriented x 3 with fluent speech NEURO: no focal motor/sensory deficits  LABORATORY DATA:  I have reviewed the data as listed Lab Results  Component Value Date   WBC 9.8 04/03/2017   HGB  15.3 04/03/2017   HCT 47.1 04/03/2017   MCV 98.9 04/03/2017   PLT 292 04/03/2017     Chemistry      Component Value Date/Time   NA 136 11/15/2016 1024   K 4.4 11/15/2016 1024   CL 93 (L) 11/15/2016 1024   CO2 36 (H) 11/15/2016 1024   BUN 16 11/15/2016 1024   CREATININE 1.29 11/15/2016 1024   CREATININE 0.94 11/03/2014 0000      Component Value Date/Time   CALCIUM 9.6 11/15/2016 1024   ALKPHOS 111 08/31/2015 0935   AST 17 08/31/2015 0935   ALT 17 08/31/2015 0935   BILITOT 0.4 08/31/2015 0935       RADIOGRAPHIC STUDIES: I have personally reviewed the radiological images as listed and agreed with the findings in the report. Ct Chest High Resolution  Result Date: 03/17/2017 CLINICAL DATA:  62 year old male with history of abnormal chest x-ray demonstrating potential infiltrate in the right lung. EXAM: CT CHEST WITHOUT CONTRAST TECHNIQUE: Multidetector CT imaging of the chest was performed following the standard protocol without intravenous contrast. High resolution imaging of the lungs, as well as inspiratory and expiratory imaging,  was performed. COMPARISON:  No priors. FINDINGS: Cardiovascular: Heart size is normal. There is no significant pericardial fluid, thickening or pericardial calcification. There is aortic atherosclerosis, as well as atherosclerosis of the great vessels of the mediastinum and the coronary arteries, including calcified atherosclerotic plaque in the left main, left anterior descending, left circumflex and right coronary arteries. Calcifications of the aortic valve. Mediastinum/Nodes: No pathologically enlarged mediastinal or hilar lymph nodes. Please note that accurate exclusion of hilar adenopathy is limited on noncontrast CT scans. Esophagus is unremarkable in appearance. No axillary lymphadenopathy. Lungs/Pleura: Multiple small calcified noncalcified pulmonary nodules are noted throughout the lungs bilaterally. The largest of these is a with mixed solid and sub solid nodule in the left upper lobe (axial image 32 of series 5) measuring 13 x 7 mm, with a central solid component measuring 3 mm (image 32 of series 3). The largest solid nodule is in the right lower lobe measuring 9 x 4 mm (axial image 116 of series 5). Mild diffuse bronchial wall thickening with mild centrilobular and paraseptal emphysema. No consolidative airspace disease. No pleural effusions. Inspiratory and expiratory imaging is unremarkable. Upper Abdomen: Diffuse low attenuation throughout the visualized hepatic parenchyma, compatible with a background of hepatic steatosis. Aortic atherosclerosis. Musculoskeletal: Expansile predominantly lytic lesion in the anterolateral aspect of the right sixth rib with soft tissue component (axial image 82 of series 3 and coronal image 24 of series 7) measuring 2.9 x 5.0 x 4.0 cm. No other osseous lesions are noted. IMPRESSION: 1. The finding on the recent chest radiograph corresponds to an expansile predominantly lytic lesion in the anterolateral aspect of the right sixth rib which has a significant soft tissue  component. 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 2. In addition, there multiple small pulmonary nodules scattered throughout the lungs bilaterally, largest of which is a mixed solid and sub solid nodule in the medial aspect of the left upper lobe measuring 13 x 7 mm, with a 3 mm central solid component. Follow-up non-contrast CT recommended at 3-6 months to confirm persistence. If unchanged, and solid component remains <6 mm, annual CT is recommended until 5 years of stability has been established. If persistent these nodules should be considered highly suspicious if the solid component of the nodule  is 6 mm or greater in size and enlarging. This recommendation follows the consensus statement: Guidelines for Management of Incidental Pulmonary Nodules Detected on CT Images: From the Fleischner Society 2017; Radiology 2017; 284:228-243. 3. Aortic atherosclerosis, in addition to left main and 3 vessel coronary artery disease. Please note that although the presence of coronary artery calcium documents the presence of coronary artery disease, the severity of this disease and any potential stenosis cannot be assessed on this non-gated CT examination. Assessment for potential risk factor modification, dietary therapy or pharmacologic therapy may be warranted, if clinically indicated. 4. There are calcifications of the aortic valve. Echocardiographic correlation for evaluation of potential valvular dysfunction may be warranted if clinically indicated. These results will be called to the ordering clinician or representative by the Radiologist Assistant, and communication documented in the PACS or zVision Dashboard. Aortic Atherosclerosis (ICD10-I70.0). Electronically Signed   By: Vinnie Langton M.D.   On: 03/17/2017 13:19   Nm Pet Image Initial (pi) Skull Base To Thigh  Result Date: 03/27/2017 CLINICAL DATA:   Initial treatment strategy for right rib lesion. EXAM: NUCLEAR MEDICINE PET SKULL BASE TO THIGH TECHNIQUE: 9.3 mCi F-18 FDG was injected intravenously. Full-ring PET imaging was performed from the skull base to thigh after the radiotracer. CT data was obtained and used for attenuation correction and anatomic localization. Fasting blood glucose: 108 mg/dl Mediastinal blood pool activity: SUV max 2.34 COMPARISON:  Chest CT 03/16/2017 FINDINGS: NECK: No hypermetabolic lymph nodes in the neck. Incidental CT findings: none CHEST: Vague ground-glass opacity in the left upper lobe centrally on image number 16 does not show any hypermetabolism. Stable 4 mm right upper lobe nodule on image number 30. No definite hypermetabolism. Other small scattered sub 4 mm nodules are unchanged. No acute pulmonary findings or pleural effusion. No enlarged or hypermetabolic mediastinal or hilar lymph nodes. Incidental CT findings: none ABDOMEN/PELVIS: No abnormal hypermetabolic activity within the liver, pancreas, adrenal glands, or spleen. No hypermetabolic lymph nodes in the abdomen or pelvis. Incidental CT findings: none SKELETON: Expansile right sixth rib lesion is hypermetabolic with SUV max of 1.47. It could be a metastatic lesion but I do not see any obvious primary neoplasm. I suspect there is chondroid matrix and this could be a chondrosarcoma. Small focus of metabolic activity noted in the spinous process of L5. No obvious lesion. This could be stress reaction. FDG uptake noted in the right paraspinal muscles in the thoracic area probably lobe related to muscle injury or strain. No muscle lesion is identified. Incidental CT findings: none IMPRESSION: 1. 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 Recommend biopsy. Metastatic focus is possible but I do not see any primary neoplasm. There also appears to be chondroid matrix. 2. Left upper lobe ground-glass  nodule is stable. No obvious hypermetabolism. Other small nodules are unchanged. Recommend surveillance. Electronically Signed   By: Marijo Sanes M.D.   On: 03/27/2017 16:08   Ct Biopsy  Result Date: 04/03/2017 INDICATION: Right anterior sixth lytic expansile rib lesion which is PET positive EXAM: CT-GUIDED BIOPSY OF THE RIGHT ANTERIOR SIXTH RIB LESION MEDICATIONS: 1% LIDOCAINE LOCAL ANESTHESIA/SEDATION: 0.5 mg IV Versed; 100 mcg IV Fentanyl Moderate Sedation Time:  50 MINUTES The patient was continuously monitored during the procedure by the interventional radiology nurse under my direct supervision. PROCEDURE: The procedure, risks, benefits, and alternatives were explained to the patient. Questions regarding the procedure were encouraged and answered. The patient understands and consents to  the procedure. Previous imaging reviewed. Patient positioned supine. Noncontrast localization CT performed. The right anterior sixth rib expansile lytic lesion was localized. Overlying skin marked. Under sterile conditions and local anesthesia, an 11 gauge bone biopsy needle was advanced to the lesion along the longitudinal access directed away from the lung. Several passes made with the 11 gauge core needle for biopsy of the lesion. Samples placed in formalin. Postprocedure imaging demonstrates no hemorrhage or hematoma. Patient tolerated the procedure well without complication. Vital sign monitoring by nursing staff during the procedure will continue as patient is in the special procedures unit for post procedure observation. FINDINGS: The images document guide needle placement within the right anterior sixth rib lesion. Post biopsy images demonstrate no hemorrhage or hematoma. COMPLICATIONS: None immediate. IMPRESSION: Successful CT-guided core biopsy of the right anterior sixth rib lesion Electronically Signed   By: Jerilynn Mages.  Shick M.D.   On: 04/03/2017 12:35    ASSESSMENT & PLAN:  Primary chondrosarcoma of rib ([place]) 1.   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  PET/CT scan done on 03/27/2017 showed expansile hypermetabolic right sixth anterior rib lesion.  It also showed left upper lobe groundglass nodule which is stable compared to the CT scan from 03/16/2017.  A biopsy on 04/03/2017 confirmed chondrosarcoma of the right sixth rib.  No other evidence of metastatic disease was found on the PET scan.  I would like to make a referral to a sarcoma clinic and get their opinion about surgical resection.  He is agreeable to this option.  No orders of the defined types were placed in this encounter.   I have spent 45 minutes with more than 50% of the time face-to-face discussing about his diagnosis and coordination of care.     Derek Jack, MD 04/11/2017 12:25 PM

## 2017-04-19 DIAGNOSIS — C413 Malignant neoplasm of ribs, sternum and clavicle: Secondary | ICD-10-CM | POA: Diagnosis not present

## 2017-04-19 DIAGNOSIS — J449 Chronic obstructive pulmonary disease, unspecified: Secondary | ICD-10-CM | POA: Diagnosis not present

## 2017-05-16 DIAGNOSIS — G4733 Obstructive sleep apnea (adult) (pediatric): Secondary | ICD-10-CM | POA: Diagnosis not present

## 2017-05-16 DIAGNOSIS — G478 Other sleep disorders: Secondary | ICD-10-CM | POA: Diagnosis not present

## 2017-05-16 DIAGNOSIS — J9622 Acute and chronic respiratory failure with hypercapnia: Secondary | ICD-10-CM | POA: Diagnosis not present

## 2017-05-16 DIAGNOSIS — C413 Malignant neoplasm of ribs, sternum and clavicle: Secondary | ICD-10-CM | POA: Diagnosis not present

## 2017-05-16 DIAGNOSIS — K219 Gastro-esophageal reflux disease without esophagitis: Secondary | ICD-10-CM | POA: Insufficient documentation

## 2017-05-16 DIAGNOSIS — I10 Essential (primary) hypertension: Secondary | ICD-10-CM | POA: Diagnosis not present

## 2017-05-16 DIAGNOSIS — Z01810 Encounter for preprocedural cardiovascular examination: Secondary | ICD-10-CM | POA: Diagnosis not present

## 2017-05-16 DIAGNOSIS — Z01818 Encounter for other preprocedural examination: Secondary | ICD-10-CM | POA: Diagnosis not present

## 2017-05-16 DIAGNOSIS — Z9981 Dependence on supplemental oxygen: Secondary | ICD-10-CM | POA: Insufficient documentation

## 2017-05-16 DIAGNOSIS — R0683 Snoring: Secondary | ICD-10-CM | POA: Diagnosis not present

## 2017-05-16 DIAGNOSIS — E872 Acidosis: Secondary | ICD-10-CM | POA: Diagnosis not present

## 2017-05-16 DIAGNOSIS — E875 Hyperkalemia: Secondary | ICD-10-CM | POA: Diagnosis not present

## 2017-05-16 DIAGNOSIS — Z720 Tobacco use: Secondary | ICD-10-CM | POA: Insufficient documentation

## 2017-05-16 DIAGNOSIS — J9621 Acute and chronic respiratory failure with hypoxia: Secondary | ICD-10-CM | POA: Diagnosis not present

## 2017-05-16 DIAGNOSIS — R0681 Apnea, not elsewhere classified: Secondary | ICD-10-CM | POA: Diagnosis not present

## 2017-05-16 DIAGNOSIS — R600 Localized edema: Secondary | ICD-10-CM | POA: Diagnosis not present

## 2017-05-16 DIAGNOSIS — J9612 Chronic respiratory failure with hypercapnia: Secondary | ICD-10-CM | POA: Diagnosis not present

## 2017-05-16 DIAGNOSIS — J449 Chronic obstructive pulmonary disease, unspecified: Secondary | ICD-10-CM | POA: Diagnosis not present

## 2017-05-16 DIAGNOSIS — J441 Chronic obstructive pulmonary disease with (acute) exacerbation: Secondary | ICD-10-CM | POA: Diagnosis not present

## 2017-05-16 DIAGNOSIS — J9611 Chronic respiratory failure with hypoxia: Secondary | ICD-10-CM | POA: Diagnosis not present

## 2017-05-23 DIAGNOSIS — R918 Other nonspecific abnormal finding of lung field: Secondary | ICD-10-CM | POA: Diagnosis not present

## 2017-05-23 DIAGNOSIS — J9621 Acute and chronic respiratory failure with hypoxia: Secondary | ICD-10-CM | POA: Diagnosis not present

## 2017-05-23 DIAGNOSIS — Z720 Tobacco use: Secondary | ICD-10-CM | POA: Diagnosis not present

## 2017-05-23 DIAGNOSIS — Z483 Aftercare following surgery for neoplasm: Secondary | ICD-10-CM | POA: Diagnosis not present

## 2017-05-23 DIAGNOSIS — I10 Essential (primary) hypertension: Secondary | ICD-10-CM | POA: Diagnosis not present

## 2017-05-23 DIAGNOSIS — Z978 Presence of other specified devices: Secondary | ICD-10-CM | POA: Diagnosis not present

## 2017-05-23 DIAGNOSIS — J9622 Acute and chronic respiratory failure with hypercapnia: Secondary | ICD-10-CM | POA: Diagnosis not present

## 2017-05-23 DIAGNOSIS — R0789 Other chest pain: Secondary | ICD-10-CM | POA: Diagnosis not present

## 2017-05-23 DIAGNOSIS — Z9989 Dependence on other enabling machines and devices: Secondary | ICD-10-CM | POA: Diagnosis not present

## 2017-05-23 DIAGNOSIS — J441 Chronic obstructive pulmonary disease with (acute) exacerbation: Secondary | ICD-10-CM | POA: Diagnosis not present

## 2017-05-23 DIAGNOSIS — J9611 Chronic respiratory failure with hypoxia: Secondary | ICD-10-CM | POA: Diagnosis not present

## 2017-05-23 DIAGNOSIS — R140 Abdominal distension (gaseous): Secondary | ICD-10-CM | POA: Diagnosis not present

## 2017-05-23 DIAGNOSIS — Z9981 Dependence on supplemental oxygen: Secondary | ICD-10-CM | POA: Diagnosis not present

## 2017-05-23 DIAGNOSIS — Z4682 Encounter for fitting and adjustment of non-vascular catheter: Secondary | ICD-10-CM | POA: Diagnosis not present

## 2017-05-23 DIAGNOSIS — K219 Gastro-esophageal reflux disease without esophagitis: Secondary | ICD-10-CM | POA: Diagnosis not present

## 2017-05-23 DIAGNOSIS — E872 Acidosis: Secondary | ICD-10-CM | POA: Diagnosis not present

## 2017-05-23 DIAGNOSIS — J449 Chronic obstructive pulmonary disease, unspecified: Secondary | ICD-10-CM | POA: Diagnosis not present

## 2017-05-23 DIAGNOSIS — G8918 Other acute postprocedural pain: Secondary | ICD-10-CM | POA: Diagnosis not present

## 2017-05-23 DIAGNOSIS — I499 Cardiac arrhythmia, unspecified: Secondary | ICD-10-CM | POA: Diagnosis not present

## 2017-05-23 DIAGNOSIS — R600 Localized edema: Secondary | ICD-10-CM | POA: Diagnosis not present

## 2017-05-23 DIAGNOSIS — G4733 Obstructive sleep apnea (adult) (pediatric): Secondary | ICD-10-CM | POA: Diagnosis not present

## 2017-05-23 DIAGNOSIS — E875 Hyperkalemia: Secondary | ICD-10-CM | POA: Diagnosis not present

## 2017-05-23 DIAGNOSIS — Z9889 Other specified postprocedural states: Secondary | ICD-10-CM | POA: Diagnosis not present

## 2017-05-23 DIAGNOSIS — C413 Malignant neoplasm of ribs, sternum and clavicle: Secondary | ICD-10-CM | POA: Diagnosis not present

## 2017-05-23 DIAGNOSIS — R911 Solitary pulmonary nodule: Secondary | ICD-10-CM | POA: Diagnosis not present

## 2017-05-23 DIAGNOSIS — J9811 Atelectasis: Secondary | ICD-10-CM | POA: Diagnosis not present

## 2017-05-23 DIAGNOSIS — J9612 Chronic respiratory failure with hypercapnia: Secondary | ICD-10-CM | POA: Diagnosis not present

## 2017-05-23 DIAGNOSIS — R030 Elevated blood-pressure reading, without diagnosis of hypertension: Secondary | ICD-10-CM | POA: Diagnosis not present

## 2017-05-30 ENCOUNTER — Ambulatory Visit: Payer: Medicare Other | Admitting: Internal Medicine

## 2017-05-30 MED ORDER — ALBUTEROL SULFATE (2.5 MG/3ML) 0.083% IN NEBU
5.00 | INHALATION_SOLUTION | RESPIRATORY_TRACT | Status: DC
Start: ? — End: 2017-05-30

## 2017-05-30 MED ORDER — HYDRALAZINE HCL 20 MG/ML IJ SOLN
10.00 | INTRAMUSCULAR | Status: DC
Start: ? — End: 2017-05-30

## 2017-05-30 MED ORDER — POLYETHYLENE GLYCOL 3350 17 G PO PACK
17.00 g | PACK | ORAL | Status: DC
Start: 2017-05-31 — End: 2017-05-30

## 2017-05-30 MED ORDER — LIDOCAINE 5 % EX PTCH
1.00 | MEDICATED_PATCH | CUTANEOUS | Status: DC
Start: 2017-05-30 — End: 2017-05-30

## 2017-05-30 MED ORDER — LABETALOL HCL 5 MG/ML IV SOLN
10.00 | INTRAVENOUS | Status: DC
Start: ? — End: 2017-05-30

## 2017-05-30 MED ORDER — OXYCODONE HCL 5 MG PO TABS
5.00 | ORAL_TABLET | ORAL | Status: DC
Start: ? — End: 2017-05-30

## 2017-05-30 MED ORDER — GENERIC EXTERNAL MEDICATION
7.50 | Status: DC
Start: ? — End: 2017-05-30

## 2017-05-30 MED ORDER — DIPHENHYDRAMINE HCL 25 MG PO CAPS
25.00 | ORAL_CAPSULE | ORAL | Status: DC
Start: ? — End: 2017-05-30

## 2017-05-30 MED ORDER — ENOXAPARIN SODIUM 40 MG/0.4ML ~~LOC~~ SOLN
40.00 | SUBCUTANEOUS | Status: DC
Start: 2017-05-30 — End: 2017-05-30

## 2017-05-30 MED ORDER — CEPHALEXIN 500 MG PO CAPS
500.00 | ORAL_CAPSULE | ORAL | Status: DC
Start: ? — End: 2017-05-30

## 2017-05-30 MED ORDER — BISACODYL 10 MG RE SUPP
10.00 | RECTAL | Status: DC
Start: ? — End: 2017-05-30

## 2017-05-30 MED ORDER — GENERIC EXTERNAL MEDICATION
60.00 | Status: DC
Start: 2017-05-31 — End: 2017-05-30

## 2017-05-30 MED ORDER — SENNOSIDES-DOCUSATE SODIUM 8.6-50 MG PO TABS
2.00 | ORAL_TABLET | ORAL | Status: DC
Start: 2017-05-30 — End: 2017-05-30

## 2017-05-30 MED ORDER — SORBITOL 70 % RE SOLN
30.00 | RECTAL | Status: DC
Start: ? — End: 2017-05-30

## 2017-05-30 MED ORDER — IPRATROPIUM-ALBUTEROL 0.5-2.5 (3) MG/3ML IN SOLN
3.00 | RESPIRATORY_TRACT | Status: DC
Start: 2017-05-30 — End: 2017-05-30

## 2017-05-30 MED ORDER — POTASSIUM CHLORIDE ER 10 MEQ PO CPCR
ORAL_CAPSULE | ORAL | Status: DC
Start: 2017-05-31 — End: 2017-05-30

## 2017-05-30 MED ORDER — BUDESONIDE 0.5 MG/2ML IN SUSP
0.50 | RESPIRATORY_TRACT | Status: DC
Start: 2017-05-30 — End: 2017-05-30

## 2017-05-30 MED ORDER — PANTOPRAZOLE SODIUM 40 MG PO TBEC
40.00 | DELAYED_RELEASE_TABLET | ORAL | Status: DC
Start: 2017-05-31 — End: 2017-05-30

## 2017-05-30 MED ORDER — ACETAMINOPHEN 500 MG PO TABS
1000.00 | ORAL_TABLET | ORAL | Status: DC
Start: 2017-05-30 — End: 2017-05-30

## 2017-05-30 MED ORDER — ARFORMOTEROL TARTRATE 15 MCG/2ML IN NEBU
15.00 | INHALATION_SOLUTION | RESPIRATORY_TRACT | Status: DC
Start: 2017-05-30 — End: 2017-05-30

## 2017-06-01 ENCOUNTER — Encounter: Payer: Self-pay | Admitting: Acute Care

## 2017-06-01 ENCOUNTER — Ambulatory Visit: Payer: Medicare Other | Admitting: Acute Care

## 2017-06-01 DIAGNOSIS — J9612 Chronic respiratory failure with hypercapnia: Secondary | ICD-10-CM | POA: Diagnosis not present

## 2017-06-01 DIAGNOSIS — F17210 Nicotine dependence, cigarettes, uncomplicated: Secondary | ICD-10-CM | POA: Diagnosis not present

## 2017-06-01 DIAGNOSIS — J449 Chronic obstructive pulmonary disease, unspecified: Secondary | ICD-10-CM

## 2017-06-01 DIAGNOSIS — J9611 Chronic respiratory failure with hypoxia: Secondary | ICD-10-CM | POA: Diagnosis not present

## 2017-06-01 NOTE — Patient Instructions (Addendum)
It is good to see you today. Congratulations on quitting smoking 5/13. Keep it up. Continue bevespi 1 puff twice daily Rinse mouth after use. Continue nebs twice daily as you have been doing. Continue oxygen at 4 L with activity, and try to see if you can drop it to 3 L with rest. Saturation goals are 88-92% We will work on weaning your oxygen . Follow up with Dr. Lianne Moris as scheduled 5/31 at [facility]. Follow up with Dr. [name] in 1 month to ensure you are still doing well. Note your daily symptoms > remember "red flags" for COPD:  Increase in cough, increase in sputum production, increase in shortness of breath or activity tolerance. If you notice these symptoms, please call to be seen.

## 2017-06-01 NOTE — Progress Notes (Signed)
History of Present Illness Randall Sawyer is a 62 y.o. male with COPD and CHF. He is followed by Dr. Melvyn Sawyer  06/01/2017  Hospital Follow up: Pt. 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 Surgeon was Dr. Lianne Sawyer. He states his breathing is better. He states he did require up to 8 L of oxygen at one point, but he has weaned himself to 4 L. Prior to surgery he was only requiring 1 L Palmer at bedtime. He states his secretions are baseline. He has white secretions. He still has JP drain with serousang drainage. He states he empties the drain about 3 times a day. He has follow up with the surgeon 5/31. He denies fever, chest pain, orthopnea or hemoptysis. He is using Bevespi 1 puff twice daily. He is also doing albuterol and budesonide nebs twice daily. He states he is doing well with this regimen. He is compliant.He does have some lower extremity edema. He denies fever, chest pain, orthopnea or hemoptysis.  [place] 5/16 [place]  Test Results: 03/27/2017 PET IMPRESSION: 1. 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 Recommend biopsy. Metastatic focus is possible but I do not see any primary neoplasm. There also appears to be chondroid matrix. 2. Left upper lobe ground-glass nodule is stable. No obvious hypermetabolism. Other small nodules are unchanged. Recommend surveillance.    CBC Latest Ref Rng & Units 04/03/2017 11/15/2016 08/31/2015  WBC 4.0 - 10.5 K/uL 9.8 7.9 9.2  Hemoglobin 13.0 - 17.0 g/dL 15.3 15.5 15.8  Hematocrit 39.0 - 52.0 % 47.1 46.7 45.9  Platelets 150 - 400 K/uL 292 245.0 250.0    BMP Latest Ref Rng & Units 11/15/2016 02/09/2016 11/09/2015  Glucose 70 - 99 mg/dL 97 109(H) 97  BUN 6 - 23 mg/dL 16 24(H) 17  Creatinine  0.40 - 1.50 mg/dL 1.29 1.44 1.35  Sodium 135 - 145 mEq/L 136 136 136  Potassium 3.5 - 5.1 mEq/L 4.4 4.1 4.6  Chloride 96 - 112 mEq/L 93(L) 93(L) 90(L)  CO2 19 - 32 mEq/L 36(H) 40(H) 39(H)  Calcium 8.4 - 10.5 mg/dL 9.6 9.8 9.7    BNP    Component Value Date/Time   BNP 13.3 12/27/2013 1022    ProBNP    Component Value Date/Time   PROBNP 25.0 08/31/2015 0935    PFT    Component Value Date/Time   FEV1PRE 0.72 07/23/2014 1146   FEV1POST 0.84 07/23/2014 1146   FVCPRE 1.64 07/23/2014 1146   FVCPOST 1.91 07/23/2014 1146   DLCOUNC 19.41 07/23/2014 1146   PREFEV1FVCRT 44 07/23/2014 1146   PSTFEV1FVCRT 44 07/23/2014 1146    No results found.   Past medical hx Past Medical History:  Diagnosis Date  . Asthma   . COPD (chronic obstructive pulmonary disease) ([place])   . Hypertension   . Nephrolithiasis   . Tobacco abuse      Social History   Tobacco Use  . Smoking status: Current Every Day Smoker    Packs/day: 0.25    Years: 51.00    Pack years: 12.75    Types: Cigarettes    Start date: 12/30/1964  . Smokeless tobacco: Never Used  . Tobacco comment: working on quitting  Substance Use Topics  . Alcohol use: No  Alcohol/week: 0.0 oz  . Drug use: No    Mr.Randall Sawyer reports that he has been smoking cigarettes.  He started smoking about 52 years ago. He has a 12.75 pack-year smoking history. He has never used smokeless tobacco. He reports that he does not drink alcohol or use drugs.  Tobacco Cessation: Former smoker quit 04/25/2017 Past surgical hx, Family hx, Social hx all reviewed.  Current Outpatient Medications on File Prior to Visit  Medication Sig  . albuterol (PROVENTIL HFA;VENTOLIN HFA) 108 (90 Base) MCG/ACT inhaler Inhale 2 puffs into the lungs every 4 (four) hours as needed for wheezing or shortness of breath.  Marland Kitchen albuterol (PROVENTIL) (2.5 MG/3ML) 0.083% nebulizer solution Take 3 mLs (2.5 mg total) by nebulization every 4 (four) hours as needed for wheezing  or shortness of breath.  . furosemide (LASIX) 40 MG tablet TAKE 1 AND 1/2 TABLETS BY MOUTH EVERY DAY  . Glycopyrrolate-Formoterol (BEVESPI) 9-4.8 MCG/ACT AERO Inhale 2 puffs 2 (two) times daily into the lungs. Take 2 puffs first thing in am and then another 2 puffs about 12 hours later.  . OXYGEN Inhale 1 L into the lungs at bedtime.  . pantoprazole (PROTONIX) 40 MG tablet TAKE 1 TABLET(40 MG) BY MOUTH DAILY BEFORE BREAKFAST   No current facility-administered medications on file prior to visit.      No Known Allergies  Review Of Systems:  Constitutional:   No  weight loss, night sweats,  Fevers, chills, fatigue, or  lassitude.  HEENT:   No headaches,  Difficulty swallowing,  Tooth/dental problems, or  Sore throat,                No sneezing, itching, ear ache, nasal congestion, post nasal drip,   CV:  No chest pain,  Orthopnea, PND, swelling in lower extremities, anasarca, dizziness, palpitations, syncope.   GI  No heartburn, indigestion, abdominal pain, nausea, vomiting, diarrhea, change in bowel habits, loss of appetite, bloody stools.   Resp: + baseline shortness of breath with exertion or at rest.  + Baseline excess mucus, + baseline productive cough,  No non-productive cough,  No coughing up of blood.  No change in color of mucus.  No wheezing.  No chest wall deformity  Skin: no rash or lesions.  GU: no dysuria, change in color of urine, no urgency or frequency.  No flank pain, no hematuria   MS:  No joint pain or swelling.  No decreased range of motion.  No back pain.  Psych:  No change in mood or affect. No depression or anxiety.  No memory loss.   Vital Signs BP 124/72 (BP Location: Right Arm, Cuff Size: Normal)   Pulse 89   Ht 5\' 10"  (1.778 m)   Wt 194 lb 3.2 oz (88.1 kg)   SpO2 96%   BMI 27.86 kg/m    Physical Exam:  General- No distress,  A&Ox3, pleasant male wearing Amor[place] ENT: No sinus tenderness, TM clear, pale nasal mucosa, no oral exudate,no post nasal drip,  no LAN Cardiac: S1, S2, regular rate and rhythm, no murmur Chest: No wheeze/ rales/ dullness; no accessory muscle use, no nasal flaring, no sternal retractions, diminished per bases Abd.: Soft Non-tender, ND, JP drain to RUQ with serosanguinous drainage, BS +, Obese Ext: No clubbing cyanosis, 2+ edema BLE ( States has had x 3 years) Neuro:  normal strength, MAE x 4, A&O x 3 Skin: No rashes, warm and dry Psych: normal mood and behavior   Assessment/Plan  COPD GOLD IV  still smoking Stable after hospitalization for rib removal ( Cancer) Plan Congratulations on quitting smoking 5/13. Keep it up. Continue bevespi 1 puff twice daily Rinse mouth after use. Continue nebs twice daily as you have been doing. Continue oxygen at 4 L with activity, and try to see if you can drop it to 3 L with rest. Saturation goals are 88-92% We will work on weaning your oxygen . Follow up with Dr. Lianne Sawyer as scheduled 5/31 at [facility]. Follow up with Dr. Melvyn Sawyer in 1 month to ensure you are still doing well. Note your daily symptoms > remember "red flags" for COPD:  Increase in cough, increase in sputum production, increase in shortness of breath or activity tolerance. If you notice these symptoms, please call to be seen.      Chronic respiratory failure with hypoxia and hypercapnia (HCC) Increased oxygen needs after right rib surgery Plan: Continue oxygen at 4 L with activity, and try to see if you can drop it to 3 L with rest. Saturation goals are 88-92% We will work on weaning your oxygen .      Magdalen Spatz, NP 06/01/2017  11:57 AM

## 2017-06-01 NOTE — Assessment & Plan Note (Signed)
Stable after hospitalization for rib removal ( Cancer) Plan Congratulations on quitting smoking 5/13. Keep it up. Continue bevespi 1 puff twice daily Rinse mouth after use. Continue nebs twice daily as you have been doing. Continue oxygen at 4 L with activity, and try to see if you can drop it to 3 L with rest. Saturation goals are 88-92% We will work on weaning your oxygen . Follow up with Dr. Lianne Moris as scheduled 5/31 at [facility]. Follow up with Dr. [name] in 1 month to ensure you are still doing well. Note your daily symptoms > remember "red flags" for COPD:  Increase in cough, increase in sputum production, increase in shortness of breath or activity tolerance. If you notice these symptoms, please call to be seen.

## 2017-06-01 NOTE — Assessment & Plan Note (Signed)
Increased oxygen needs after right rib surgery Plan: Continue oxygen at 4 L with activity, and try to see if you can drop it to 3 L with rest. Saturation goals are 88-92% We will work on weaning your oxygen .

## 2017-06-02 DIAGNOSIS — Z9981 Dependence on supplemental oxygen: Secondary | ICD-10-CM | POA: Diagnosis not present

## 2017-06-02 DIAGNOSIS — J441 Chronic obstructive pulmonary disease with (acute) exacerbation: Secondary | ICD-10-CM | POA: Diagnosis not present

## 2017-06-02 DIAGNOSIS — R222 Localized swelling, mass and lump, trunk: Secondary | ICD-10-CM | POA: Diagnosis not present

## 2017-06-02 DIAGNOSIS — C413 Malignant neoplasm of ribs, sternum and clavicle: Secondary | ICD-10-CM | POA: Diagnosis not present

## 2017-06-02 DIAGNOSIS — K219 Gastro-esophageal reflux disease without esophagitis: Secondary | ICD-10-CM | POA: Diagnosis not present

## 2017-06-05 ENCOUNTER — Encounter: Payer: Self-pay | Admitting: Family Medicine

## 2017-06-05 DIAGNOSIS — C413 Malignant neoplasm of ribs, sternum and clavicle: Secondary | ICD-10-CM | POA: Insufficient documentation

## 2017-06-07 DIAGNOSIS — C413 Malignant neoplasm of ribs, sternum and clavicle: Secondary | ICD-10-CM | POA: Diagnosis not present

## 2017-06-07 DIAGNOSIS — J441 Chronic obstructive pulmonary disease with (acute) exacerbation: Secondary | ICD-10-CM | POA: Diagnosis not present

## 2017-06-07 DIAGNOSIS — R222 Localized swelling, mass and lump, trunk: Secondary | ICD-10-CM | POA: Diagnosis not present

## 2017-06-07 DIAGNOSIS — K219 Gastro-esophageal reflux disease without esophagitis: Secondary | ICD-10-CM | POA: Diagnosis not present

## 2017-06-07 DIAGNOSIS — Z9981 Dependence on supplemental oxygen: Secondary | ICD-10-CM | POA: Diagnosis not present

## 2017-06-09 DIAGNOSIS — J449 Chronic obstructive pulmonary disease, unspecified: Secondary | ICD-10-CM | POA: Diagnosis not present

## 2017-06-09 DIAGNOSIS — C413 Malignant neoplasm of ribs, sternum and clavicle: Secondary | ICD-10-CM | POA: Diagnosis not present

## 2017-06-09 DIAGNOSIS — I10 Essential (primary) hypertension: Secondary | ICD-10-CM | POA: Diagnosis not present

## 2017-06-09 DIAGNOSIS — Z87891 Personal history of nicotine dependence: Secondary | ICD-10-CM | POA: Diagnosis not present

## 2017-06-09 DIAGNOSIS — J9611 Chronic respiratory failure with hypoxia: Secondary | ICD-10-CM | POA: Diagnosis not present

## 2017-06-09 DIAGNOSIS — J9612 Chronic respiratory failure with hypercapnia: Secondary | ICD-10-CM | POA: Diagnosis not present

## 2017-06-11 DIAGNOSIS — T8189XA Other complications of procedures, not elsewhere classified, initial encounter: Secondary | ICD-10-CM | POA: Diagnosis not present

## 2017-06-13 DIAGNOSIS — J9622 Acute and chronic respiratory failure with hypercapnia: Secondary | ICD-10-CM | POA: Diagnosis not present

## 2017-06-13 DIAGNOSIS — Z792 Long term (current) use of antibiotics: Secondary | ICD-10-CM | POA: Diagnosis not present

## 2017-06-13 DIAGNOSIS — J441 Chronic obstructive pulmonary disease with (acute) exacerbation: Secondary | ICD-10-CM | POA: Diagnosis not present

## 2017-06-13 DIAGNOSIS — Z483 Aftercare following surgery for neoplasm: Secondary | ICD-10-CM | POA: Diagnosis not present

## 2017-06-13 DIAGNOSIS — C413 Malignant neoplasm of ribs, sternum and clavicle: Secondary | ICD-10-CM | POA: Diagnosis not present

## 2017-06-13 DIAGNOSIS — Z4803 Encounter for change or removal of drains: Secondary | ICD-10-CM | POA: Diagnosis not present

## 2017-06-13 DIAGNOSIS — J9621 Acute and chronic respiratory failure with hypoxia: Secondary | ICD-10-CM | POA: Diagnosis not present

## 2017-06-13 DIAGNOSIS — I509 Heart failure, unspecified: Secondary | ICD-10-CM | POA: Diagnosis not present

## 2017-06-13 DIAGNOSIS — Z9981 Dependence on supplemental oxygen: Secondary | ICD-10-CM | POA: Diagnosis not present

## 2017-06-15 DIAGNOSIS — J9621 Acute and chronic respiratory failure with hypoxia: Secondary | ICD-10-CM | POA: Diagnosis not present

## 2017-06-15 DIAGNOSIS — J441 Chronic obstructive pulmonary disease with (acute) exacerbation: Secondary | ICD-10-CM | POA: Diagnosis not present

## 2017-06-15 DIAGNOSIS — Z483 Aftercare following surgery for neoplasm: Secondary | ICD-10-CM | POA: Diagnosis not present

## 2017-06-15 DIAGNOSIS — J9622 Acute and chronic respiratory failure with hypercapnia: Secondary | ICD-10-CM | POA: Diagnosis not present

## 2017-06-15 DIAGNOSIS — C413 Malignant neoplasm of ribs, sternum and clavicle: Secondary | ICD-10-CM | POA: Diagnosis not present

## 2017-06-15 DIAGNOSIS — I509 Heart failure, unspecified: Secondary | ICD-10-CM | POA: Diagnosis not present

## 2017-06-15 DIAGNOSIS — Z9981 Dependence on supplemental oxygen: Secondary | ICD-10-CM | POA: Diagnosis not present

## 2017-06-15 DIAGNOSIS — Z4803 Encounter for change or removal of drains: Secondary | ICD-10-CM | POA: Diagnosis not present

## 2017-06-15 DIAGNOSIS — Z792 Long term (current) use of antibiotics: Secondary | ICD-10-CM | POA: Diagnosis not present

## 2017-06-20 DIAGNOSIS — C413 Malignant neoplasm of ribs, sternum and clavicle: Secondary | ICD-10-CM | POA: Diagnosis not present

## 2017-06-20 DIAGNOSIS — Z792 Long term (current) use of antibiotics: Secondary | ICD-10-CM | POA: Diagnosis not present

## 2017-06-20 DIAGNOSIS — I509 Heart failure, unspecified: Secondary | ICD-10-CM | POA: Diagnosis not present

## 2017-06-20 DIAGNOSIS — Z9981 Dependence on supplemental oxygen: Secondary | ICD-10-CM | POA: Diagnosis not present

## 2017-06-20 DIAGNOSIS — J441 Chronic obstructive pulmonary disease with (acute) exacerbation: Secondary | ICD-10-CM | POA: Diagnosis not present

## 2017-06-20 DIAGNOSIS — Z483 Aftercare following surgery for neoplasm: Secondary | ICD-10-CM | POA: Diagnosis not present

## 2017-06-20 DIAGNOSIS — J9622 Acute and chronic respiratory failure with hypercapnia: Secondary | ICD-10-CM | POA: Diagnosis not present

## 2017-06-20 DIAGNOSIS — J9621 Acute and chronic respiratory failure with hypoxia: Secondary | ICD-10-CM | POA: Diagnosis not present

## 2017-06-20 DIAGNOSIS — Z4803 Encounter for change or removal of drains: Secondary | ICD-10-CM | POA: Diagnosis not present

## 2017-06-22 DIAGNOSIS — Z483 Aftercare following surgery for neoplasm: Secondary | ICD-10-CM | POA: Diagnosis not present

## 2017-06-22 DIAGNOSIS — J9622 Acute and chronic respiratory failure with hypercapnia: Secondary | ICD-10-CM | POA: Diagnosis not present

## 2017-06-22 DIAGNOSIS — J9621 Acute and chronic respiratory failure with hypoxia: Secondary | ICD-10-CM | POA: Diagnosis not present

## 2017-06-22 DIAGNOSIS — Z792 Long term (current) use of antibiotics: Secondary | ICD-10-CM | POA: Diagnosis not present

## 2017-06-22 DIAGNOSIS — J441 Chronic obstructive pulmonary disease with (acute) exacerbation: Secondary | ICD-10-CM | POA: Diagnosis not present

## 2017-06-22 DIAGNOSIS — I509 Heart failure, unspecified: Secondary | ICD-10-CM | POA: Diagnosis not present

## 2017-06-22 DIAGNOSIS — Z9981 Dependence on supplemental oxygen: Secondary | ICD-10-CM | POA: Diagnosis not present

## 2017-06-22 DIAGNOSIS — C413 Malignant neoplasm of ribs, sternum and clavicle: Secondary | ICD-10-CM | POA: Diagnosis not present

## 2017-06-22 DIAGNOSIS — Z4803 Encounter for change or removal of drains: Secondary | ICD-10-CM | POA: Diagnosis not present

## 2017-06-27 DIAGNOSIS — J9622 Acute and chronic respiratory failure with hypercapnia: Secondary | ICD-10-CM | POA: Diagnosis not present

## 2017-06-27 DIAGNOSIS — J441 Chronic obstructive pulmonary disease with (acute) exacerbation: Secondary | ICD-10-CM | POA: Diagnosis not present

## 2017-06-27 DIAGNOSIS — Z792 Long term (current) use of antibiotics: Secondary | ICD-10-CM | POA: Diagnosis not present

## 2017-06-27 DIAGNOSIS — Z4803 Encounter for change or removal of drains: Secondary | ICD-10-CM | POA: Diagnosis not present

## 2017-06-27 DIAGNOSIS — C413 Malignant neoplasm of ribs, sternum and clavicle: Secondary | ICD-10-CM | POA: Diagnosis not present

## 2017-06-27 DIAGNOSIS — Z9981 Dependence on supplemental oxygen: Secondary | ICD-10-CM | POA: Diagnosis not present

## 2017-06-27 DIAGNOSIS — I509 Heart failure, unspecified: Secondary | ICD-10-CM | POA: Diagnosis not present

## 2017-06-27 DIAGNOSIS — Z483 Aftercare following surgery for neoplasm: Secondary | ICD-10-CM | POA: Diagnosis not present

## 2017-06-27 DIAGNOSIS — J9621 Acute and chronic respiratory failure with hypoxia: Secondary | ICD-10-CM | POA: Diagnosis not present

## 2017-06-29 DIAGNOSIS — J9622 Acute and chronic respiratory failure with hypercapnia: Secondary | ICD-10-CM | POA: Diagnosis not present

## 2017-06-29 DIAGNOSIS — J9621 Acute and chronic respiratory failure with hypoxia: Secondary | ICD-10-CM | POA: Diagnosis not present

## 2017-06-29 DIAGNOSIS — Z792 Long term (current) use of antibiotics: Secondary | ICD-10-CM | POA: Diagnosis not present

## 2017-06-29 DIAGNOSIS — J441 Chronic obstructive pulmonary disease with (acute) exacerbation: Secondary | ICD-10-CM | POA: Diagnosis not present

## 2017-06-29 DIAGNOSIS — I509 Heart failure, unspecified: Secondary | ICD-10-CM | POA: Diagnosis not present

## 2017-06-29 DIAGNOSIS — C413 Malignant neoplasm of ribs, sternum and clavicle: Secondary | ICD-10-CM | POA: Diagnosis not present

## 2017-06-29 DIAGNOSIS — Z9981 Dependence on supplemental oxygen: Secondary | ICD-10-CM | POA: Diagnosis not present

## 2017-06-29 DIAGNOSIS — Z483 Aftercare following surgery for neoplasm: Secondary | ICD-10-CM | POA: Diagnosis not present

## 2017-06-29 DIAGNOSIS — Z4803 Encounter for change or removal of drains: Secondary | ICD-10-CM | POA: Diagnosis not present

## 2017-07-04 ENCOUNTER — Ambulatory Visit: Payer: Medicare Other | Admitting: Internal Medicine

## 2017-07-04 ENCOUNTER — Encounter: Payer: Self-pay | Admitting: Internal Medicine

## 2017-07-04 VITALS — BP 126/78 | HR 100 | Ht 67.0 in | Wt 201.8 lb

## 2017-07-04 DIAGNOSIS — J441 Chronic obstructive pulmonary disease with (acute) exacerbation: Secondary | ICD-10-CM | POA: Diagnosis not present

## 2017-07-04 DIAGNOSIS — I509 Heart failure, unspecified: Secondary | ICD-10-CM | POA: Diagnosis not present

## 2017-07-04 DIAGNOSIS — Z483 Aftercare following surgery for neoplasm: Secondary | ICD-10-CM | POA: Diagnosis not present

## 2017-07-04 DIAGNOSIS — J9621 Acute and chronic respiratory failure with hypoxia: Secondary | ICD-10-CM | POA: Diagnosis not present

## 2017-07-04 DIAGNOSIS — Z4803 Encounter for change or removal of drains: Secondary | ICD-10-CM | POA: Diagnosis not present

## 2017-07-04 DIAGNOSIS — J9612 Chronic respiratory failure with hypercapnia: Secondary | ICD-10-CM

## 2017-07-04 DIAGNOSIS — J9622 Acute and chronic respiratory failure with hypercapnia: Secondary | ICD-10-CM | POA: Diagnosis not present

## 2017-07-04 DIAGNOSIS — Z792 Long term (current) use of antibiotics: Secondary | ICD-10-CM | POA: Diagnosis not present

## 2017-07-04 DIAGNOSIS — J449 Chronic obstructive pulmonary disease, unspecified: Secondary | ICD-10-CM | POA: Diagnosis not present

## 2017-07-04 DIAGNOSIS — C413 Malignant neoplasm of ribs, sternum and clavicle: Secondary | ICD-10-CM | POA: Diagnosis not present

## 2017-07-04 DIAGNOSIS — J9611 Chronic respiratory failure with hypoxia: Secondary | ICD-10-CM | POA: Diagnosis not present

## 2017-07-04 DIAGNOSIS — Z9981 Dependence on supplemental oxygen: Secondary | ICD-10-CM | POA: Diagnosis not present

## 2017-07-04 NOTE — Assessment & Plan Note (Signed)
Venous dopplers 10/02/2015 > neg bilaterally   Improved on lasix/ ald with ok labs 11/15/17   Adequate control on present rx, reviewed in detail with pt > no change in rx needed

## 2017-07-04 NOTE — Patient Instructions (Addendum)
2lpm 24/7 for now but increase to 3lpm pulsed via POC walking   Plan A = Automatic = Bevespi Take 2 puffs first thing in am and then another 2 puffs about 12 hours later.   Work on inhaler technique:  relax and gently blow all the way out then take a nice smooth deep breath back in, triggering the inhaler at same time you start breathing in.  Hold for up to 5 seconds if you can. Blow out thru nose. Rinse and gargle with water when done   Plan B = Backup Only use your albuterol as a rescue medication to be used if you can't catch your breath by resting or doing a relaxed purse lip breathing pattern.  - The less you use it, the better it will work when you need it. - Ok to use the inhaler up to 2 puffs  every 4 hours if you must but call for appointment if use goes up over your usual need - Don't leave home without it !!  (think of it like the spare tire for your car)   Plan C = Crisis - only use your albuterol/ipatropium nebulizer if you first try Plan B and it fails to help > ok to use the nebulizer up to every 4 hours but if start needing it regularly call for immediate appointment   Please schedule a follow up visit in 3 months but call sooner if needed - add: next ov if doing well go back to just alb as plan C

## 2017-07-04 NOTE — Assessment & Plan Note (Addendum)
HC03  36  05/16/14 corresponding to a pc02 above 55  - ono RA < 89% x 4 h 11 min > 09/12/2014  rec one liter per min hs and recheck = > done 9/141/6 with < 89% x 83m > no change rx  - HC03  11/09/2015  = 39  - HCO3 02/09/2016    = 40  - HC03  11/15/2016    = 36   - 07/04/2017  Walked RA x 3 laps @ 185 ft each stopped due to  desat to 85% then completed 3rd lap nl pace on 3lpm POC pulsed    as  Of 07/04/2017   rec 2lpm 24/7 x 3lpm POC pulsed walking

## 2017-07-04 NOTE — Progress Notes (Signed)
Subjective:    Patient ID: Randall Sawyer, male    DOB: 1955-08-18     MRN: [id number]    Brief patient profile:  43 yowm active smoker first noticed doe x around 2005 first on just albuterol then added spiriva then symbicort then stiolto which works the best so far and referred to pulmonary clinic 05/27/2014 by Dr Dennard Schaumann for copd eval and proved to have a GOLD IV severity  07/23/14 with hypercarbia.   History of Present Illness  05/27/2014 1st [facility] office visit/ Cynai Skeens   Chief Complaint  Patient presents with  . Pulmonary Consult    referred by Dr. Dennard Schaumann SOB x 1 year. C/o of SOB, fluid retention, prod cough with yellow thick mucus.  Wheezing late in the evenings. Left sided abdominal pain.   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                                      spiriva 2 puffs each am  Plan B = Back up - Only use your albuterol as a rescue medication  Plan C = crisis - Your albuterol nebulizer can be used up to every  4 hours with the goal of not needing at all eventually  Prednisone 10 mg take  4 each am x 2 days,   2 each am x 2 days,  1 each am x 2 days and stop      06/11/2014 f/u ov/Carnetta Losada re: copd still smoking   Chief Complaint  Patient presents with  . Follow-up    fluid retention not as bad; cough; chest tightness;states here for paperwork;  last used ventolin 1 hours - has not tried rechallenge/ sleeps ok  Just walking to mb makes him stop one  half way going both ways though uphill to it.  Not needing neb as much  No noct or early am exac  Has not worked since 05/27/14 rec Plan A =  Automatic =  Symbicort 160 Take 2 puffs first thing in am and then another 2 puffs about 12 hours later.                                      spiriva 2 puffs each am     Plan B = Back up - Only use your albuterol as a rescue medication Plan C = crisis - Your albuterol nebulizer can be used up to every  4 hours with the goal of not needing at all eventually  The key is to stop smoking completely before smoking completely stops you!        03/02/2017  f/u ov/Genesis Novosad re:  GOLD IV  Better doe on bevespi  Still smoking 4 cigs  per day  Chief Complaint  Patient presents with  . Follow-up    Breathing is unchanged. He is using proair 1-2 x per day on average.   Dyspnea:  No change = MMRC3 = can't walk 100 yards even at a slow pace at a flat grade s stopping due to sob   Cough: lots of am congestin/ clear mucus p 2-3 min each am clear it faily easily  Sleep: propped up close to 30 degrees Feels needs saba by 1 pm p bevespi  at 4 am but this is less than was the case on symb Leg swelling improved some rec No change rx       It is good to see you today. Congratulations on quitting smoking 5/13. Keep it up. Continue bevespi 1 puff twice daily Rinse mouth after use. Continue nebs twice daily as you have been doing. Continue oxygen at 4 L with activity, and try to see if you can drop it to 3 L with rest. Saturation goals are 88-92% We will work on weaning your oxygen . Follow up with Dr. Lianne Moris as scheduled 5/31 at [facility].    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 Follow-up    follow up for breathing patient states he is doing better  Dyspnea:  [employer] shopping on 2lpm  Cough: minimal white mucus more in  am  SABA use: rare 02:  2lpm 24/7 and no smoking   Confused with rx for bevespi, pulmocort/ duoneb and proventil    No obvious day to day or daytime variability or assoc excess/ purulent sputum or mucus plugs or hemoptysis or cp or chest tightness, subjective wheeze or overt sinus or hb symptoms.   Sleeping: on back 30 degrees with pillows  without nocturnal  or early am exacerbation  of respiratory  c/o's or need for noct saba. Also denies any obvious fluctuation of symptoms with weather or environmental changes or other aggravating or alleviating factors except as outlined above   No unusual exposure hx or h/o childhood pna/ asthma or knowledge of premature birth.  Current Allergies, Complete Past Medical History, Past Surgical History, Family History, and Social History were reviewed in [employer] record.  ROS  The following are not active complaints unless bolded Hoarseness, sore throat, dysphagia, dental problems, itching, sneezing,  nasal congestion or discharge of excess mucus or purulent secretions, ear ache,   fever, chills, sweats, unintended wt loss or wt gain, classically pleuritic or exertional cp,  orthopnea pnd or arm/hand swelling  or leg swelling, presyncope, palpitations, abdominal pain, anorexia, nausea, vomiting, diarrhea  or change in bowel habits or change in bladder habits, change in stools or change in urine, dysuria, hematuria,  rash, arthralgias, visual complaints, headache, numbness, weakness or ataxia or problems with walking or coordination,  change in mood or  memory.        Current Meds  Medication Sig  . albuterol (PROVENTIL HFA;VENTOLIN HFA) 108 (90 Base) MCG/ACT inhaler Inhale 2 puffs into the lungs every 4 (four) hours as needed for wheezing or shortness of breath.  Marland Kitchen albuterol (PROVENTIL) (2.5 MG/3ML) 0.083% nebulizer solution Take 3 mLs (2.5 mg total) by nebulization every 4 (four) hours as needed for wheezing or shortness of breath.   . furosemide (LASIX)  40 MG tablet TAKE 1 AND 1/2 TABLETS BY MOUTH EVERY DAY  . Glycopyrrolate-Formoterol (BEVESPI) 9-4.8 MCG/ACT AERO Inhale 2 puffs 2 (two) times daily into the lungs. Take 2 puffs first thing in am and then another 2 puffs about 12 hours later.  Marland Kitchen ipratropium-albuterol (DUONEB) 0.5-2.5 (3) MG/3ML SOLN Take 3 mLs by nebulization every 6 (six) hours as needed.  . OXYGEN Inhale 1 L into the lungs at bedtime.  . pantoprazole (PROTONIX) 40 MG tablet TAKE 1 TABLET(40 MG) BY MOUTH DAILY BEFORE BREAKFAST  . [  budesonide (PULMICORT) 0.5 MG/2ML nebulizer solution Take 0.5 mg by nebulization 2 (two) times daily.                  Objective:   Physical Exam  amb wm nad  06/11/2014           179 >  07/23/2014   184 > 09/03/2014 185 > 12/01/2014  196 > 03/03/2015  190 > 08/31/2015     192 > 09/28/2015  195  > 11/09/2015   186  > 02/09/2016     189> 11/15/2016  198 >  03/02/2017  204 > 07/04/2017  201     05/27/14 180 lb (81.647 kg)  05/16/14 176 lb (79.833 kg)  05/09/14 180 lb (81.647 kg)      Vital signs reviewed - Note on arrival 02 sats  96% on  2lpm continuous           full dentures   Barrel  contour chest wall with bilateral  Distant bs with minimal insp and exp rhonchi/rib popping on R   CV:  RRR  no s3 or murmur or increase in P2, and  Pitting edema both legs R> L despite elastic hose    HEENT: nl  turbinates bilaterally, and oropharynx. Nl external ear canals without cough reflex - full dentures    NECK :  without JVD/Nodes/TM/ nl carotid upstrokes bilaterally   LUNGS: no acc muscle use,   Barrel contour chest/ incisions healing fine, distant bs with popping on insp on R no wheeze   CV:  RRR  no s3 or murmur or increase in P2, and  1+ ptting edema both feet   ABD:  soft and nontender with nl inspiratory excursion in the supine position. No bruits or organomegaly appreciated, bowel sounds nl  MS:  Nl gait/ ext warm without deformities, calf tenderness, cyanosis  or clubbing No obvious joint restrictions   SKIN: warm and dry without lesions    NEURO:  alert, approp, nl sensorium with  no motor or cerebellar deficits apparent.                 Assessment & Plan:

## 2017-07-04 NOTE — Assessment & Plan Note (Signed)
-   HC03 36 05/16/14 - 05/27/2014 p extensive coaching HFA effectiveness =    90% > try symbicort/ incruse  - 06/11/14 Spriometry FEV1  0.57 (15%) ratio 34 1 h p last saba  - PFTs 07/23/2014   FEV1  0.84 (25%) ratio 44 p 16% improvement and dlco 65 p am symbicort  -  09/03/2014  Walked RA x 3 laps @ 185 ft each stopped due to  End of study, nl pace, no sob or desat   - 03/03/2015   try BEVESPI    - d/c cigs 05/21/17 at resection of R rib chondrosarcoma  - 07/04/2017  After extensive coaching inhaler device  effectiveness =    90% from a baseline of 75%(short Ti)   Pt is Group B in terms of symptom/risk and laba/lama therefore appropriate rx at this point so will try just on bevespi @ approp dose = 2bid plus prn saba hfa and prn duoneb to back that up    I had an extended discussion with the patient reviewing all relevant studies completed to date and  lasting 15 to 20 minutes of a 25 minute visit    See device teaching which extended face to face time for this visit.  Each maintenance medication was reviewed in detail including emphasizing most importantly the difference between maintenance and prns and under what circumstances the prns are to be triggered using an action plan format that is not reflected in the computer generated alphabetically organized AVS which I have not found useful in most complex patients, especially with respiratory illnesses  Please see AVS for specific instructions unique to this visit that I personally wrote and verbalized to the the pt in detail and then reviewed with pt  by my nurse highlighting any  changes in therapy recommended at today's visit to their plan of care.

## 2017-07-12 DIAGNOSIS — C413 Malignant neoplasm of ribs, sternum and clavicle: Secondary | ICD-10-CM | POA: Diagnosis not present

## 2017-08-14 ENCOUNTER — Telehealth: Payer: Self-pay | Admitting: Internal Medicine

## 2017-08-14 NOTE — Telephone Encounter (Signed)
Spoke with pt's POA, Mandy. She is not listed on the pt's DPR but is listed an emergency contact. Leafy Ro is going to fax a copy of the pt's POA information. Zigmund Gottron per the referral note from 07/04/17, the pt is not eligible for a POC due to being a capped rental period. Leafy Ro was not made aware of this and is going to contact [place] ([facility]) about this. Nothing further was needed.

## 2017-09-30 DIAGNOSIS — J449 Chronic obstructive pulmonary disease, unspecified: Secondary | ICD-10-CM | POA: Diagnosis not present

## 2017-10-04 ENCOUNTER — Encounter: Payer: Self-pay | Admitting: Internal Medicine

## 2017-10-04 ENCOUNTER — Ambulatory Visit: Payer: Medicare Other | Admitting: Internal Medicine

## 2017-10-04 VITALS — BP 124/64 | HR 101 | Ht 66.0 in | Wt 213.0 lb

## 2017-10-04 DIAGNOSIS — J449 Chronic obstructive pulmonary disease, unspecified: Secondary | ICD-10-CM

## 2017-10-04 DIAGNOSIS — J9612 Chronic respiratory failure with hypercapnia: Secondary | ICD-10-CM | POA: Diagnosis not present

## 2017-10-04 DIAGNOSIS — J9611 Chronic respiratory failure with hypoxia: Secondary | ICD-10-CM | POA: Diagnosis not present

## 2017-10-04 NOTE — Assessment & Plan Note (Signed)
-   HC03 36 05/16/14 - 05/27/2014 p extensive coaching HFA effectiveness =    90% > try symbicort/ incruse  - 06/11/14 Spriometry FEV1  0.57 (15%) ratio 34 1 h p last saba  - PFTs 07/23/2014   FEV1  0.84 (25%) ratio 44 p 16% improvement and dlco 65 p am symbicort  -  09/03/2014  Walked RA x 3 laps @ 185 ft each stopped due to  End of study, nl pace, no sob or desat   - 03/03/2015   try BEVESPI   - d/c cigs 05/21/17 at resection of R rib chondrosarcoma   10/04/2017  After extensive coaching inhaler device,  effectiveness =    90%    Pt is still Group B in terms of symptom/risk and laba/lama therefore appropriate rx at this point > continue bevespi 2bid

## 2017-10-04 NOTE — Progress Notes (Signed)
Subjective:    Patient ID: Randall Sawyer, male    DOB: 26-Dec-1955     MRN: [id number]    Brief patient profile:  68 yowm active smoker first noticed doe x around 2005 first on just albuterol then added spiriva then symbicort then stiolto which works the best so far and referred to pulmonary clinic 05/27/2014 by Dr Dennard Schaumann for copd eval and proved to have a GOLD IV severity  07/23/14 with hypercarbia.   History of Present Illness  05/27/2014 1st [facility] office visit/ Randall Sawyer   Chief Complaint  Patient presents with  . Pulmonary Consult    referred by Dr. Dennard Schaumann SOB x 1 year. C/o of SOB, fluid retention, prod cough with yellow thick mucus.  Wheezing late in the evenings. Left sided abdominal pain.   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                                      spiriva 2 puffs each am  Plan B = Back up - Only use your albuterol as a rescue medication  Plan C = crisis - Your albuterol nebulizer can be used up to every  4 hours with the goal of not needing at all eventually  Prednisone 10 mg take  4 each am x 2 days,   2 each am x 2 days,  1 each am x 2 days and stop      06/11/2014 f/u ov/Randall Sawyer re: copd still smoking   Chief Complaint  Patient presents with  . Follow-up    fluid retention not as bad; cough; chest tightness;states here for paperwork;  last used ventolin 1 hours - has not tried rechallenge/ sleeps ok  Just walking to mb makes him stop one  half way going both ways though uphill to it.  Not needing neb as much  No noct or early am exac  Has not worked since 05/27/14 rec Plan A =  Automatic =  Symbicort 160 Take 2 puffs first thing in am and then another 2 puffs about 12 hours later.                                      spiriva 2 puffs each am     Plan B = Back up - Only use your albuterol as a rescue medication Plan C = crisis - Your albuterol nebulizer can be used up to every  4 hours with the goal of not needing at all eventually  The key is to stop smoking completely before smoking completely stops you!        03/02/2017  f/u ov/Randall Sawyer re:  GOLD IV  Better doe on bevespi  Still smoking 4 cigs  per day  Chief Complaint  Patient presents with  . Follow-up    Breathing is unchanged. He is using proair 1-2 x per day on average.   Dyspnea:  No change = MMRC3 = can't walk 100 yards even at a slow pace at a flat grade s stopping due to sob   Cough: lots of am congestin/ clear mucus p 2-3 min each am clear it faily easily  Sleep: propped up close to 30 degrees Feels needs saba by 1 pm p bevespi  at 4 am but this is less than was the case on symb Leg swelling improved some rec No change rx       It is good to see you today. Congratulations on quitting smoking 5/13. Keep it up. Continue bevespi 1 puff twice daily Rinse mouth after use. Continue nebs twice daily as you have been doing. Continue oxygen at 4 L with activity, and try to see if you can drop it to 3 L with rest. Saturation goals are 88-92% We will work on weaning your oxygen . Follow up with Dr. Lianne Sawyer as scheduled 5/31 at [facility].    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 Follow-up    follow up for breathing patient states he is doing better  Dyspnea:  [employer] shopping on 2lpm  Cough: minimal white mucus more in  am SABA use: rare 02:  2lpm 24/7 and no smoking  rec 2lpm 24/7 for now but increase to 3lpm pulsed via POC walking  Plan A = Automatic = Bevespi Take 2 puffs first thing in am and then another 2 puffs about 12 hours later.  Work on inhaler technique:  Plan B = Backup Only use your albuterol as a rescue medication  Plan C = Crisis - only use your albuterol/ipatropium nebulizer if you first try Plan B and it fails to help > ok to use the nebulizer up to every 4 hours but if start needing it regularly call for immediate appointment Please schedule a follow up visit in 3 months but call sooner if needed - add: next ov if doing well go back to just alb as plan C     10/04/2017  f/u ov/Randall Sawyer re:  GOLD IV/ maint off ciggs and on bevespi/ 02  Chief Complaint  Patient presents with  . Follow-up    Breathing is unchanged since the last visit. He has good days and bad days. He uses his albuterol inhaler once daily on average. He rarely uses neb.    Dyspnea:  [employer] shopping on 3lpm  Cough: min mucoid  Sleeping: 1lpm / bed / 3 pillows  SABA use: every evening p supper  02: 1lpm at rest, 3lpm with activity     No obvious day to day or daytime variability or assoc excess/ purulent sputum or mucus plugs or hemoptysis or cp or chest tightness, subjective wheeze or overt sinus or hb symptoms.   Sleeping as above  without nocturnal  or early am exacerbation  of respiratory  c/o's or need for noct saba. Also denies any obvious fluctuation of symptoms with weather or environmental changes or other aggravating or alleviating factors except as outlined above   No unusual exposure hx or h/o childhood pna/ asthma or knowledge of premature birth.  Current Allergies, Complete Past Medical History, Past Surgical History, Family History, and Social History were reviewed in [employer] record.  ROS  The following are not active complaints unless bolded Hoarseness, sore throat,  dysphagia, dental problems, itching, sneezing,  nasal congestion or discharge of excess mucus or purulent secretions, ear ache,   fever, chills, sweats, unintended wt loss or wt gain, classically pleuritic or exertional cp,  orthopnea pnd or arm/hand swelling  or leg swelling, presyncope, palpitations, abdominal pain, anorexia, nausea, vomiting, diarrhea  or change in bowel habits or change in bladder habits, change in stools or change in urine, dysuria, hematuria,  rash, arthralgias, visual complaints, headache, numbness, weakness or ataxia or problems with walking or coordination,  change in mood or  memory.        Current Meds  Medication Sig  . albuterol (PROVENTIL HFA;VENTOLIN HFA) 108 (90 Base) MCG/ACT inhaler Inhale 2 puffs into the lungs every 4 (four) hours as needed for wheezing or shortness of breath.  . furosemide (LASIX) 40 MG tablet TAKE 1 AND 1/2 TABLETS BY MOUTH EVERY DAY  . Glycopyrrolate-Formoterol (BEVESPI) 9-4.8 MCG/ACT AERO Inhale 2 puffs 2 (two) times daily into the lungs. Take 2 puffs first thing in am and then another 2 puffs about 12 hours later.  Marland Kitchen ipratropium-albuterol (DUONEB) 0.5-2.5 (3) MG/3ML SOLN Take 3 mLs by nebulization every 6 (six) hours as needed.  . OXYGEN Inhale 1 L into the lungs at bedtime. And 3 lpm with exertion  . pantoprazole (PROTONIX) 40 MG tablet TAKE 1 TABLET(40 MG) BY MOUTH DAILY BEFORE BREAKFAST                    Objective:   Physical Exam  amb obese wm nad  06/11/2014           179 >  07/23/2014   184 > 09/03/2014 185 > 12/01/2014  196 > 03/03/2015  190 > 08/31/2015     192 > 09/28/2015  195  > 11/09/2015   186  > 02/09/2016     189> 11/15/2016  198 >  03/02/2017  204 > 07/04/2017  201> 10/04/2017 213      05/27/14 180 lb (81.647 kg)  05/16/14 176 lb (79.833 kg)  05/09/14 180 lb (81.647 kg)    Vital signs reviewed - Note on arrival 02 sats  92% on  3lpm continuous               HEENT: nl  oropharynx. Nl external ear canals without  cough reflex -  Mild bilateral non-specific turbinate edema / full dentures     NECK :  without JVD/Nodes/TM/ nl carotid upstrokes bilaterally   LUNGS: no acc muscle use,  Mild barrel  contour chest wall with bilateral  Distant bs s audible wheeze and  without cough on insp or exp maneuver and mild  Hyperresonant  to  percussion bilaterally     CV:  RRR  no s3 or murmur or increase in P2, and  Trace pitting sym both feet   ABD:  soft and nontender with pos late insp Hoover's  in the supine position. No bruits or organomegaly appreciated, bowel sounds nl  MS:   Nl gait/  ext warm without deformities, calf tenderness, cyanosis or clubbing No obvious joint restrictions   SKIN: warm and dry without lesions    NEURO:  alert, approp, nl sensorium with  no motor or cerebellar deficits apparent.  Assessment & Plan:

## 2017-10-04 NOTE — Patient Instructions (Addendum)
No change in medications    Congratulations on not smoking, it's the most important aspect of your care   Please schedule a follow up visit in 6 months but call sooner if needed

## 2017-10-04 NOTE — Assessment & Plan Note (Signed)
HC03  36  05/16/14 corresponding to a pc02 above 55  - ono RA < 89% x 4 h 11 min > 09/12/2014  rec one liter per min hs and recheck = > done 9/141/6 with < 89% x 62m > no change rx  - HC03  11/09/2015  = 39  - HCO3 02/09/2016    = 40  - HC03  11/15/2016    = 36  - 07/04/2017  Walked RA x 3 laps @ 185 ft each stopped due to  desat to 85% then completed 3rd lap nl pace on 3lpm POC pulsed    as  Of  10/04/2017   rec 1lpm 24/7 x 3lpm POC pulsed walking    .Adequate control on present rx, reviewed in detail with pt > no change in rx needed     I had an extended discussion with the patient reviewing all relevant studies completed to date and  lasting 15 to 20 minutes of a 25 minute visit    See device teaching which extended face to face time for this visit.  Each maintenance medication was reviewed in detail including emphasizing most importantly the difference between maintenance and prns and under what circumstances the prns are to be triggered using an action plan format that is not reflected in the computer generated alphabetically organized AVS which I have not found useful in most complex patients, especially with respiratory illnesses  Please see AVS for specific instructions unique to this visit that I personally wrote and verbalized to the the pt in detail and then reviewed with pt  by my nurse highlighting any  changes in therapy recommended at today's visit to their plan of care.

## 2017-10-18 DIAGNOSIS — J449 Chronic obstructive pulmonary disease, unspecified: Secondary | ICD-10-CM | POA: Diagnosis not present

## 2017-10-30 DIAGNOSIS — J449 Chronic obstructive pulmonary disease, unspecified: Secondary | ICD-10-CM | POA: Diagnosis not present

## 2017-11-18 DIAGNOSIS — J449 Chronic obstructive pulmonary disease, unspecified: Secondary | ICD-10-CM | POA: Diagnosis not present

## 2017-11-29 ENCOUNTER — Telehealth: Payer: Self-pay | Admitting: Internal Medicine

## 2017-11-29 ENCOUNTER — Telehealth: Payer: Self-pay | Admitting: *Deleted

## 2017-11-29 MED ORDER — FUROSEMIDE 40 MG PO TABS: 60.0000 mg | ORAL_TABLET | Freq: Every day | ORAL | 2 refills | Status: DC

## 2017-11-29 NOTE — Telephone Encounter (Signed)
Called and spoke with patient, he stated that he has been getting a card with money on it for the bevespi. I asked patient if it was a copay card and he stated that it was not, it was a card with money for the inhaler to be covered.   MW please advise on this, thank you.

## 2017-11-29 NOTE — Telephone Encounter (Signed)
Called and spoke to patient, made aware he needs to call his insurance and find out what is on his formulary. Voiced understanding. Nothing further is needed at this time.

## 2017-11-29 NOTE — Telephone Encounter (Signed)
Medicare does not allow coupons - there was a program for the first month for free and I believe that's what he's talking about so if can't afford it thru his present insurance rec one sample and ov with formulary on hand to pick a cheaper alternative

## 2017-11-30 DIAGNOSIS — J449 Chronic obstructive pulmonary disease, unspecified: Secondary | ICD-10-CM | POA: Diagnosis not present

## 2017-12-04 NOTE — Telephone Encounter (Signed)
Looks like encounter was opened in error as there is no documentation in this encounter. Closing the open encounter.

## 2017-12-15 DIAGNOSIS — R918 Other nonspecific abnormal finding of lung field: Secondary | ICD-10-CM | POA: Diagnosis not present

## 2017-12-15 DIAGNOSIS — Z87891 Personal history of nicotine dependence: Secondary | ICD-10-CM | POA: Diagnosis not present

## 2017-12-15 DIAGNOSIS — C419 Malignant neoplasm of bone and articular cartilage, unspecified: Secondary | ICD-10-CM | POA: Diagnosis not present

## 2017-12-15 DIAGNOSIS — Z9981 Dependence on supplemental oxygen: Secondary | ICD-10-CM | POA: Diagnosis not present

## 2017-12-15 DIAGNOSIS — R911 Solitary pulmonary nodule: Secondary | ICD-10-CM | POA: Diagnosis not present

## 2017-12-18 DIAGNOSIS — J449 Chronic obstructive pulmonary disease, unspecified: Secondary | ICD-10-CM | POA: Diagnosis not present

## 2017-12-30 DIAGNOSIS — J449 Chronic obstructive pulmonary disease, unspecified: Secondary | ICD-10-CM | POA: Diagnosis not present

## 2018-01-18 DIAGNOSIS — J449 Chronic obstructive pulmonary disease, unspecified: Secondary | ICD-10-CM | POA: Diagnosis not present

## 2018-01-28 ENCOUNTER — Other Ambulatory Visit (INDEPENDENT_AMBULATORY_CARE_PROVIDER_SITE_OTHER): Payer: Self-pay | Admitting: Internal Medicine

## 2018-01-30 DIAGNOSIS — J449 Chronic obstructive pulmonary disease, unspecified: Secondary | ICD-10-CM | POA: Diagnosis not present

## 2018-01-31 ENCOUNTER — Other Ambulatory Visit: Payer: Self-pay | Admitting: Internal Medicine

## 2018-02-01 ENCOUNTER — Telehealth: Payer: Self-pay | Admitting: Internal Medicine

## 2018-02-01 NOTE — Telephone Encounter (Signed)
Called and spoke with Patient. He stated that he needed a refill for Bevespi to be sent to [facility] in [place].  Prescription was sent 01/31/18, to requested pharmacy.  Patient stated understanding.  Nothing further at this time.

## 2018-02-18 DIAGNOSIS — J449 Chronic obstructive pulmonary disease, unspecified: Secondary | ICD-10-CM | POA: Diagnosis not present

## 2018-02-22 ENCOUNTER — Encounter: Payer: Self-pay | Admitting: Family Medicine

## 2018-02-22 ENCOUNTER — Ambulatory Visit (INDEPENDENT_AMBULATORY_CARE_PROVIDER_SITE_OTHER): Payer: Medicare Other | Admitting: Family Medicine

## 2018-02-22 VITALS — BP 128/76 | HR 110 | Temp 98.4°F | Resp 18 | Ht 70.0 in | Wt 224.0 lb

## 2018-02-22 DIAGNOSIS — M7022 Olecranon bursitis, left elbow: Secondary | ICD-10-CM | POA: Diagnosis not present

## 2018-02-22 DIAGNOSIS — M7021 Olecranon bursitis, right elbow: Secondary | ICD-10-CM

## 2018-02-22 DIAGNOSIS — L409 Psoriasis, unspecified: Secondary | ICD-10-CM | POA: Diagnosis not present

## 2018-02-22 NOTE — Progress Notes (Signed)
Subjective:    Patient ID: Randall Sawyer, male    DOB: 07-03-1955, 63 y.o.   MRN: [id number]  HPI   11/22/16 Patient presents with painless swelling of the olecranon bursa of his left elbow.  Swelling has been present for more than 2 months.  It is extremely large.  It is approximately 9 cm x 5 cm.  It is elliptical shaped which is unusual.  However it center is over the olecranon process.  There is no erythema or warmth.  There is very minimal tenderness to palpation.  However the patient would like it drained due to comfort as it is constantly in the way.  At that time, my plan was: Using sterile technique after the area was cleaned thoroughly with Betadine, I anesthetized the skin with 0.1% lidocaine with epinephrine.  I then introduced an 18-gauge needle into the olecranon bursa.  Using 2 separate syringes, I was able to extract approximately 60 cc of bloody serous fluid.  The elbow was then covered with a pressure dressing and the patient was instructed to leave the dressing in place for 72 hours.  Recheck immediately if signs of an infection develop or if recurrent  11/29/16 Shortly after removing his pressure dressing, the fluid reaccumulated in his olecranon bursa.  It is just as large today as it was previously.  Fluid culture obtained the initial visit revealed no evidence of infection.  He is here today for treatment options.  At that time, my plan was: There is no evidence of infection.  There is no erythema or warmth or pain.  I gave the option of me aspirating the olecranon bursa again and removing all the fluid then injecting the olecranon bursa with corticosteroids to try to calm any inflammation that may be causing the fluid to reaccumulate and also apply pressure dressing for 48 hours to prevent this from happening.  I explained to the patient that I was not sure that this would work.  The other option would be a referral to orthopedic surgery for a second opinion.  The patient  elected to proceed with option #1 despite the fact I am uncertain that it will be curative.  Using sterile technique, after the area was cleaned thoroughly with Betadine, I anesthetized the skin with 0.1% lidocaine with epinephrine.  I then introduced an 18-gauge needle into the olecranon bursa.  Using 2 separate syringes, I was able to extract approximately 45 cc of bloody serous fluid.  The 18-gauge needle was left in place.  The final syringe was removed from the initial needle.  A third syringe containing 1 cc of 40 mg/mL Kenalog was then attached to the 18-gauge needle using sterile technique and then injected into the olecranon bursa through the 18-gauge needle.  The elbow was then covered with a pressure dressing and the patient was instructed to leave the dressing in place for 72 hours.  Recheck immediately if signs of an infection develop.  If recurrent, I would recommend consultation with orthopedic surgery  02/22/18 I have not seen the patient since that time.  He presents today with massive bursitis in both elbows.  Fluid collection is large.  It is approximately 10 cm x 5 cm in the left elbow and approximately 10 cm x 4 cm in the right elbow.  It is soft, fluctuant, and nontender.  The skin overlying the olecranon process shows psoriasis.  He also has psoriasis on his back and abdomen which is another reason he presents today.  Patient states that the elbows do not hurt however due to the size of the bursitis, they are hindering his knee of life and he is requesting aspiration and repeat cortisone injection.  The left elbow being larger received my attention.  I cleaned the elbow thoroughly with Betadine.  I then anesthetized the skin was 0.1% lidocaine without epinephrine.  I then introduced an 18-gauge needle into the olecranon bursa however I aspirated almost 6 cc of frank blood.  At this point I discontinued aspiration and removed the needle from the bursa and applied a pressure dressing.  There  appears to be frank bleeding within the bursa.  We were not in a blood vessel.  I explained to the patient that I do not feel comfortable simply aspirating this and injecting with cortisone.  Instead I have recommend an orthopedic consultation for second opinion Past Medical History:  Diagnosis Date  . Asthma   . 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 COPD (chronic obstructive pulmonary disease) ([place])   . Hypertension   . Nephrolithiasis   . Tobacco abuse    Past Surgical History:  Procedure Laterality Date  . BIOPSY N/A 02/26/2015   Procedure: BIOPSY;  Surgeon: Rogene Houston, MD;  Location: [facility];  Service: Endoscopy;  Laterality: N/A;  Gastric biopsies  . ESOPHAGOGASTRODUODENOSCOPY N/A 11/19/2014   Procedure: ESOPHAGOGASTRODUODENOSCOPY (EGD);  Surgeon: Rogene Houston, MD;  Location: [facility];  Service: Endoscopy;  Laterality: N/A;  2:15-rescheduled to 11/9 @1 :90 Ann notified pt  . ESOPHAGOGASTRODUODENOSCOPY N/A 02/26/2015   Procedure: ESOPHAGOGASTRODUODENOSCOPY (EGD);  Surgeon: Rogene Houston, MD;  Location: [facility];  Service: Endoscopy;  Laterality: N/A;  1225   Current Outpatient Medications on File Prior to Visit  Medication Sig Dispense Refill  . albuterol (PROVENTIL HFA;VENTOLIN HFA) 108 (90 Base) MCG/ACT inhaler Inhale 2 puffs into the lungs every 4 (four) hours as needed for wheezing or shortness of breath.    Marland Kitchen BEVESPI AEROSPHERE 9-4.8 MCG/ACT AERO INHALE 2 PUFFS TWICE DAILY INTO LUNGS. TAKE 2 PUFFS FIRST IN MORNING AND 2 PUFFS BY MOUTH 12 HOURS LATER 10.7 g 11  . furosemide (LASIX) 40 MG tablet Take 1.5 tablets (60 mg total) by mouth daily. 45 tablet 2  . ipratropium-albuterol (DUONEB) 0.5-2.5 (3) MG/3ML SOLN Take 3 mLs by nebulization every 6 (six) hours as needed.    . OXYGEN Inhale 3 L into the lungs at bedtime. And 3 lpm with exertion    . pantoprazole (PROTONIX) 40 MG tablet TAKE 1 TABLET(40 MG) BY MOUTH DAILY BEFORE BREAKFAST  30 tablet 0   No current facility-administered medications on file prior to visit.    No Known Allergies Social History   Socioeconomic History  . Marital status: Divorced    Spouse name: Not on file  . Number of children: Not on file  . Years of education: Not on file  . Highest education level: Not on file  Occupational History  . Occupation: Careers adviser  . Financial resource strain: Not on file  . Food insecurity:    Worry: Not on file    Inability: Not on file  . Transportation needs:    Medical: Not on file    Non-medical: Not on file  Tobacco Use  . Smoking status: Former Smoker    Packs/day: 0.25    Years: 51.00    Pack years: 12.75    Types: Cigarettes  Start date: 12/30/1964    Last attempt to quit: 05/21/2017    Years since quitting: 0.7  . Smokeless tobacco: Never Used  . Tobacco comment: working on quitting  Substance and Sexual Activity  . Alcohol use: No    Alcohol/week: 0.0 standard drinks  . Drug use: No  . Sexual activity: Yes    Partners: Female  Lifestyle  . Physical activity:    Days per week: Not on file    Minutes per session: Not on file  . Stress: Not on file  Relationships  . Social connections:    Talks on phone: Not on file    Gets together: Not on file    Attends religious service: Not on file    Active member of club or organization: Not on file    Attends meetings of clubs or organizations: Not on file    Relationship status: Not on file  . Intimate partner violence:    Fear of current or ex partner: Not on file    Emotionally abused: Not on file    Physically abused: Not on file    Forced sexual activity: Not on file  Other Topics Concern  . Not on file  Social History Narrative  . Not on file      Review of Systems  All other systems reviewed and are negative.      Objective:   Physical Exam  Cardiovascular: Normal rate, regular rhythm and normal heart sounds.  Pulmonary/Chest: Effort normal and  breath sounds normal.  Musculoskeletal:     Right elbow: He exhibits swelling. Tenderness found.     Left elbow: He exhibits swelling and deformity. No tenderness found. No radial head, no medial epicondyle, no lateral epicondyle and no olecranon process tenderness noted.       Arms:  Skin: Rash noted. Rash is maculopapular.             Assessment & Plan:  Olecranon bursitis of left elbow  Olecranon bursitis, right elbow  Psoriasis I received frank blood when I aspirated his left olecranon bursa.  There was more than 6 cc and there was at least another 3 cc present within the olecranon bursa.  I do not feel comfortable as this is not anticipated.  Therefore I covered the aspiration site with a pressure dressing.  Patient is instructed not to remove the pressure dressing for 24 hours.  I will consult orthopedic surgery as I am not sure why there is such a large accumulation of blood in his left olecranon bursa.  Furthermore the size of the bursa is impressive and I believe requires consultation with a specialist.  I will also send the patient to a dermatologist for his psoriasis

## 2018-02-23 ENCOUNTER — Other Ambulatory Visit: Payer: Self-pay | Admitting: Internal Medicine

## 2018-02-27 ENCOUNTER — Other Ambulatory Visit (INDEPENDENT_AMBULATORY_CARE_PROVIDER_SITE_OTHER): Payer: Self-pay | Admitting: Internal Medicine

## 2018-03-01 ENCOUNTER — Ambulatory Visit (INDEPENDENT_AMBULATORY_CARE_PROVIDER_SITE_OTHER): Payer: Self-pay

## 2018-03-01 ENCOUNTER — Ambulatory Visit (INDEPENDENT_AMBULATORY_CARE_PROVIDER_SITE_OTHER): Payer: Medicare Other | Admitting: Orthopaedic Surgery

## 2018-03-01 ENCOUNTER — Encounter (INDEPENDENT_AMBULATORY_CARE_PROVIDER_SITE_OTHER): Payer: Self-pay | Admitting: Orthopaedic Surgery

## 2018-03-01 DIAGNOSIS — M7021 Olecranon bursitis, right elbow: Secondary | ICD-10-CM | POA: Insufficient documentation

## 2018-03-01 DIAGNOSIS — M25521 Pain in right elbow: Secondary | ICD-10-CM

## 2018-03-01 DIAGNOSIS — M7022 Olecranon bursitis, left elbow: Secondary | ICD-10-CM | POA: Diagnosis not present

## 2018-03-01 MED ORDER — METHYLPREDNISOLONE ACETATE 40 MG/ML IJ SUSP
13.3300 mg | INTRAMUSCULAR | Status: AC | PRN
  Administered 2018-03-01: 13.33 mg via INTRA_ARTICULAR

## 2018-03-01 MED ORDER — BUPIVACAINE HCL 0.25 % IJ SOLN
0.6600 mL | INTRAMUSCULAR | Status: AC | PRN
  Administered 2018-03-01: .66 mL via INTRA_ARTICULAR

## 2018-03-01 MED ORDER — LIDOCAINE HCL 1 % IJ SOLN
0.5000 mL | INTRAMUSCULAR | Status: AC | PRN
  Administered 2018-03-01: .5 mL

## 2018-03-01 NOTE — Progress Notes (Signed)
Office Visit Note   Patient: Randall Sawyer           Date of Birth: March 18, 1955           MRN: [id number] Visit Date: 03/01/2018              Requested by: Susy Frizzle, MD [address] [facility] [place], [place] [phone] PCP: Susy Frizzle, MD   Assessment & Plan: Visit Diagnoses:  1. Olecranon bursitis of both elbows   2. Pain in right elbow     Plan: Impression is bilateral elbow olecranon bursitis.  I aspirated 45 cc of blood from the left elbow and then injected this with cortisone.  I aspirated 35 cc of blood from the right elbow and then injected this with cortisone.  We applied a compressive wraps to both elbows.  For his psoriatic plaques, I recommended that he follow-up with his primary care provider.  He will follow-up with [place] as needed for his elbows.  Follow-Up Instructions: Return if symptoms worsen or fail to improve.   Orders:  Orders Placed This Encounter  Procedures  . Medium Joint Inj: bilateral olecranon bursa  . XR Elbow Complete Right (3+View)  . XR Elbow Complete Left (3+View)   No orders of the defined types were placed in this encounter.     Procedures: Medium Joint Inj: bilateral olecranon bursa on 03/01/2018 10:28 AM Details: 22 G needle Medications (Right): 0.5 mL lidocaine 1 %; 13.33 mg methylPREDNISolone acetate 40 MG/ML; 0.66 mL bupivacaine 0.25 % Medications (Left): 0.5 mL lidocaine 1 %; 13.33 mg methylPREDNISolone acetate 40 MG/ML; 0.66 mL bupivacaine 0.25 %      Clinical Data: No additional findings.   Subjective: Chief Complaint  Patient presents with  . Left Elbow - Pain  . Right Elbow - Pain    HPI patient is a pleasant 63 year old gentleman who presents to our clinic today with bilateral elbow swelling.  History of left olecranon bursitis which was aspirated and injected with cortisone approximately 2 years ago.  Doing well until the swelling returned about 6 months ago.  This is about the time that the right elbow  started to swell as well.  No fevers or chills.  No history of gout.  No injury.  He really only has pain when he bumps his elbow on a hard surface.    Review of Systems as detailed in HPI.  All others reviewed and are negative.   Objective: Vital Signs: There were no vitals taken for this visit.  Physical Exam well-developed well-nourished gentleman in no acute distress.  Alert and oriented x3.  Ortho Exam examination of both elbows reveals baseball size fluctuant swelling.  This is nontender.  No skin changes to the left.  It does look like he has psoriatic plaques to the right.  Full extension and flexion of the elbow.  He is neurovascular intact distally.  Specialty Comments:  No specialty comments available.  Imaging: Xr Elbow Complete Left (3+view)  Result Date: 03/01/2018 No acute or structural abnormalities  Xr Elbow Complete Right (3+view)  Result Date: 03/01/2018 No acute or structural abnormality    PMFS History: Patient Active Problem List   Diagnosis Date Noted  . Olecranon bursitis of both elbows 03/01/2018  . 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 Bone mass 03/28/2017  . Pulmonary infiltrates 03/02/2017  . Chronic respiratory failure with hypoxia and hypercapnia ([place]) 07/27/2014  .  Bilateral leg edema 02/14/2014  . Dyspnea 01/21/2014  . Kidney stones   . Cigarette smoker   . 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 Asthma   . 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 COPD (chronic obstructive pulmonary disease) ([place])   . Hypertension   . Nephrolithiasis   . Tobacco abuse     Family History  Problem Relation Age of Onset  . COPD Father   . Heart disease Father   . Asthma Father   . Diabetes type II Mother   . Diabetes Sister     Past Surgical History:  Procedure Laterality Date  . BIOPSY N/A 02/26/2015   Procedure: BIOPSY;  Surgeon: Rogene Houston,  MD;  [facility];  Service: Endoscopy;  Laterality: N/A;  Gastric biopsies  . ESOPHAGOGASTRODUODENOSCOPY N/A 11/19/2014   Procedure: ESOPHAGOGASTRODUODENOSCOPY (EGD);  Surgeon: Rogene Houston, MD;  Location: [facility];  Service: Endoscopy;  Laterality: N/A;  2:15-rescheduled to 11/9 @1 :40 Ann notified pt  . ESOPHAGOGASTRODUODENOSCOPY N/A 02/26/2015   Procedure: ESOPHAGOGASTRODUODENOSCOPY (EGD);  Surgeon: Rogene Houston, MD;  Location: [facility];  Service: Endoscopy;  Laterality: N/A;  1225   Social History   Occupational History  . Occupation: Clinical biochemist   Tobacco Use  . Smoking status: Former Smoker    Packs/day: 0.25    Years: 51.00    Pack years: 12.75    Types: Cigarettes    Start date: 12/30/1964    Last attempt to quit: 05/21/2017    Years since quitting: 0.7  . Smokeless tobacco: Never Used  . Tobacco comment: working on quitting  Substance and Sexual Activity  . Alcohol use: No    Alcohol/week: 0.0 standard drinks  . Drug use: No  . Sexual activity: Yes    Partners: Female

## 2018-03-02 DIAGNOSIS — J449 Chronic obstructive pulmonary disease, unspecified: Secondary | ICD-10-CM | POA: Diagnosis not present

## 2018-03-06 DIAGNOSIS — L400 Psoriasis vulgaris: Secondary | ICD-10-CM | POA: Diagnosis not present

## 2018-03-19 DIAGNOSIS — J449 Chronic obstructive pulmonary disease, unspecified: Secondary | ICD-10-CM | POA: Diagnosis not present

## 2018-03-31 DIAGNOSIS — J449 Chronic obstructive pulmonary disease, unspecified: Secondary | ICD-10-CM | POA: Diagnosis not present

## 2018-04-02 ENCOUNTER — Ambulatory Visit: Payer: Medicare Other | Admitting: Internal Medicine

## 2018-04-19 DIAGNOSIS — J449 Chronic obstructive pulmonary disease, unspecified: Secondary | ICD-10-CM | POA: Diagnosis not present

## 2018-05-01 DIAGNOSIS — J449 Chronic obstructive pulmonary disease, unspecified: Secondary | ICD-10-CM | POA: Diagnosis not present

## 2018-05-19 DIAGNOSIS — J449 Chronic obstructive pulmonary disease, unspecified: Secondary | ICD-10-CM | POA: Diagnosis not present

## 2018-05-22 ENCOUNTER — Other Ambulatory Visit: Payer: Self-pay | Admitting: Internal Medicine

## 2018-05-22 MED ORDER — FUROSEMIDE 40 MG PO TABS: ORAL_TABLET | ORAL | 2 refills | Status: DC

## 2018-05-28 ENCOUNTER — Encounter: Payer: Self-pay | Admitting: Internal Medicine

## 2018-05-28 ENCOUNTER — Other Ambulatory Visit: Payer: Self-pay

## 2018-05-28 ENCOUNTER — Ambulatory Visit: Payer: Medicare Other | Admitting: Internal Medicine

## 2018-05-28 DIAGNOSIS — F17210 Nicotine dependence, cigarettes, uncomplicated: Secondary | ICD-10-CM | POA: Diagnosis not present

## 2018-05-28 DIAGNOSIS — J9611 Chronic respiratory failure with hypoxia: Secondary | ICD-10-CM

## 2018-05-28 DIAGNOSIS — J9612 Chronic respiratory failure with hypercapnia: Secondary | ICD-10-CM

## 2018-05-28 DIAGNOSIS — J449 Chronic obstructive pulmonary disease, unspecified: Secondary | ICD-10-CM | POA: Diagnosis not present

## 2018-05-28 DIAGNOSIS — R600 Localized edema: Secondary | ICD-10-CM

## 2018-05-28 NOTE — Patient Instructions (Addendum)
No change in recommendations but you need to walk more    Please schedule a follow up visit in 6 months but call sooner if needed  - discuss low dose screening program p covid restrictions lifted

## 2018-05-28 NOTE — Progress Notes (Signed)
Subjective:    Patient ID: Randall Sawyer, male    DOB: 08/27/1955     MRN: [id number]    Brief patient profile:  65 yowm quit smoking 05/2017  first noticed doe x around 2005 first on just albuterol then added spiriva then symbicort then stiolto which works the best so far and referred to pulmonary clinic 05/27/2014 by Dr Dennard Schaumann for copd eval and proved to have a GOLD IV severity  07/23/14 with hypercarbia.   History of Present Illness  05/27/2014 1st [facility] office visit/ [facility]   Chief Complaint  Patient presents with  . Pulmonary Consult    referred by Dr. Dennard Schaumann SOB x 1 year. C/o of SOB, fluid retention, prod cough with yellow thick mucus.  Wheezing late in the evenings. Left sided abdominal pain.   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                                      spiriva 2 puffs each am  Plan B = Back up - Only use your albuterol as a rescue medication  Plan C = crisis - Your albuterol nebulizer can be used up to every  4 hours with the goal of not needing at all eventually  Prednisone 10 mg take  4 each am x 2 days,   2 each am x 2 days,  1 each am x 2 days and stop      06/11/2014 f/u ov/[facility] re: copd still smoking   Chief Complaint  Patient presents with  . Follow-up    fluid retention not as bad; cough; chest tightness;states here for paperwork;  last used ventolin 1 hours - has not tried rechallenge/ sleeps ok  Just walking to mb makes him  stop one half way going both ways though uphill to it.  Not needing neb as much  No noct or early am exac  Has not worked since 05/27/14 rec Plan A =  Automatic =  Symbicort 160 Take 2 puffs first thing in am and then another 2 puffs about 12 hours later.                                      spiriva 2 puffs each am     Plan B = Back up - Only use your albuterol as a rescue medication Plan C = crisis - Your albuterol nebulizer can be used up to every  4 hours with the goal of not needing at all eventually  The key is to stop smoking completely before smoking completely stops you!    05/25/17  Chest wall surgery at [facility] for R 6th  rib chondrosarcoma      10/04/2017  f/u ov/[facility] re:  GOLD IV/ maint off ciggs and on bevespi/ 02 as below Chief Complaint  Patient presents with  . Follow-up    Breathing is unchanged since the last visit. He has good days and bad days. He uses his albuterol inhaler once daily on average. He rarely uses neb.    Dyspnea:  [employer] shopping on 3lpm  Cough: min mucoid  Sleeping: 1lpm / bed / 3 pillows  SABA use: every evening p supper  02: 1lpm at rest, 3lpm with activity   rec No change rx   05/28/2018  f/u ov/[facility]ert re:  GOLD IV/ still off 02 and maintained on Bevespi and 02 as below Chief Complaint  Patient presents with  . Follow-up    Breathing is unchanged and no new co's. He is using his rescue inhaler once per wk on average. He rarely uses neb.    Dyspnea:  Not doing much walking since virus Cough: none Sleeping: on side bed is flat/two pillows SABA use: rarely needs hfa and never neb  02: 2lpm hs none sitting with sats 92 and up to 3lpm when walking at 95%    No obvious day to day or daytime variability or assoc excess/ purulent sputum or mucus plugs or hemoptysis or cp or chest tightness, subjective wheeze or overt sinus or hb symptoms.   Sleeping as above  without nocturnal  or early am exacerbation  of respiratory  c/o's or need for noct saba.  Also denies any obvious fluctuation of symptoms with weather or environmental changes or other aggravating or alleviating factors except as outlined above   No unusual exposure hx or h/o childhood pna/ asthma or knowledge of premature birth.  Current Allergies, Complete Past Medical History, Past Surgical History, Family History, and Social History were reviewed in [employer] record.  ROS  The following are not active complaints unless bolded Hoarseness, sore throat, dysphagia, dental problems, itching, sneezing,  nasal congestion or discharge of excess mucus or purulent secretions, ear ache,   fever, chills, sweats, unintended wt loss or wt gain, classically pleuritic or exertional cp,  orthopnea pnd or arm/hand swelling  or leg swelling, presyncope, palpitations, abdominal pain, anorexia, nausea, vomiting, diarrhea  or change in bowel habits or change in bladder habits, change in stools or change in urine, dysuria, hematuria,  rash, arthralgias, visual complaints, headache, numbness, weakness or ataxia or problems with walking or coordination,  change in mood or  memory.        Current Meds  Medication Sig  . albuterol (PROVENTIL HFA;VENTOLIN HFA) 108 (90 Base) MCG/ACT inhaler Inhale 2 puffs into the lungs every 4 (four) hours as needed for wheezing or shortness of breath.  Marland Kitchen BEVESPI AEROSPHERE 9-4.8 MCG/ACT AERO INHALE 2 PUFFS TWICE DAILY INTO LUNGS. TAKE 2 PUFFS FIRST IN MORNING AND 2 PUFFS BY MOUTH 12 HOURS LATER  . furosemide (LASIX) 40 MG tablet TAKE 1.5 TABLETS BY MOUTH DAILY  . ipratropium-albuterol (DUONEB) 0.5-2.5 (3) MG/3ML SOLN Take 3 mLs by nebulization every 6 (six) hours as needed.  . OXYGEN Inhale 3 L into the lungs at bedtime. And 3 lpm with exertion  . pantoprazole (PROTONIX) 40 MG tablet TAKE 1 TABLET BY MOUTH DAILY BEFORE BREAKFAST                  Objective:   Physical Exam  amb obese wm   06/11/2014  179 >  07/23/2014   184 >  09/03/2014 185 > 12/01/2014  196 > 03/03/2015  190 > 08/31/2015     192 > 09/28/2015  195  > 11/09/2015   186  > 02/09/2016     189> 11/15/2016  198 >  03/02/2017  204 > 07/04/2017  201> 10/04/2017 213  > 05/28/2018   225     05/27/14 180 lb (81.647 kg)  05/16/14 176 lb (79.833 kg)  05/09/14 180 lb (81.647 kg)    Vital signs reviewed - Note on arrival 02 sats  97% on    2lpm cont     HEENT: full denturs / nl  oropharynx. Nl external ear canals without cough reflex -  Mild bilateral non-specific turbinate edema     NECK :  without JVD/Nodes/TM/ nl carotid upstrokes bilaterally   LUNGS: no acc muscle use,  Mild barrel  contour chest wall with bilateral  Distant bs s audible wheeze and  without cough on insp or exp maneuver and mild  Hyperresonant  to  percussion bilaterally     CV:  RRR  no s3 or murmur or increase in P2, and trace to 1+ ptting R > L LEs ABD:  soft and nontender with pos end  insp Hoover's  in the supine position. No bruits or organomegaly appreciated, bowel sounds nl  MS:   Nl gait/  ext warm without deformities, calf tenderness, cyanosis or clubbing No obvious joint restrictions   SKIN: warm and dry without lesions    NEURO:  alert, approp, nl sensorium with  no motor or cerebellar deficits apparent.

## 2018-05-29 ENCOUNTER — Encounter: Payer: Self-pay | Admitting: Internal Medicine

## 2018-05-29 NOTE — Assessment & Plan Note (Signed)
HC03  36  05/16/14 corresponding to a pc02 above 55  - ono RA < 89% x 4 h 11 min > 09/12/2014  rec one liter per min hs and recheck = > done 9/141/6 with < 89% x 12m > no change rx  - HC03  11/09/2015  = 39  - HCO3 02/09/2016    = 40  - HC03  11/15/2016    = 36  - 07/04/2017  Walked RA x 3 laps @ 185 ft each stopped due to  desat to 85% then completed 3rd lap nl pace on 3lpm POC pulsed    as  Of 05/28/2018  rec 2lpm sleeping, none sitting, and up to 3lpm with walking

## 2018-05-29 NOTE — Assessment & Plan Note (Addendum)
Resolved x one year   Low-dose CT lung cancer screening is recommended for patients who are 41-66 years of age with a 30+ pack-year history of smoking, and who are currently smoking or quit <=15 years ago.   So he is eligible to begin screening and this needs to be considered when COVID restrictions have been lifted/advised

## 2018-05-29 NOTE — Assessment & Plan Note (Signed)
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/21/17 at resection of R rib chondrosarcoma - 05/28/2018  After extensive coaching inhaler device,  effectiveness =    90% so continue bevespi 2 bid        Pt is Group B in terms of symptom/risk and laba/lama therefore appropriate rx at this point >>> so he should continue the [place].

## 2018-05-29 NOTE — Assessment & Plan Note (Signed)
Venous dopplers 10/02/2015 > neg bilaterally    Adequate control on present rx, reviewed in detail with pt > no change in rx needed  > f/u pcp  Pulmonary f/u can be q 6 m   I had an extended discussion with the patient reviewing all relevant studies completed to date and  lasting 15 to 20 minutes of a 25 minute visit    See device teaching which extended face to face time for this visit.  Each maintenance medication was reviewed in detail including emphasizing most importantly the difference between maintenance and prns and under what circumstances the prns are to be triggered using an action plan format that is not reflected in the computer generated alphabetically organized AVS which I have not found useful in most complex patients, especially with respiratory illnesses  Please see AVS for specific instructions unique to this visit that I personally wrote and verbalized to the the pt in detail and then reviewed with pt  by my nurse highlighting any  changes in therapy recommended at today's visit to their plan of care.

## 2018-05-31 DIAGNOSIS — J449 Chronic obstructive pulmonary disease, unspecified: Secondary | ICD-10-CM | POA: Diagnosis not present

## 2018-06-19 DIAGNOSIS — J449 Chronic obstructive pulmonary disease, unspecified: Secondary | ICD-10-CM | POA: Diagnosis not present

## 2018-06-22 DIAGNOSIS — R918 Other nonspecific abnormal finding of lung field: Secondary | ICD-10-CM | POA: Diagnosis not present

## 2018-06-22 DIAGNOSIS — Z4789 Encounter for other orthopedic aftercare: Secondary | ICD-10-CM | POA: Diagnosis not present

## 2018-06-22 DIAGNOSIS — C413 Malignant neoplasm of ribs, sternum and clavicle: Secondary | ICD-10-CM | POA: Diagnosis not present

## 2018-06-22 DIAGNOSIS — C419 Malignant neoplasm of bone and articular cartilage, unspecified: Secondary | ICD-10-CM | POA: Diagnosis not present

## 2018-07-01 DIAGNOSIS — J449 Chronic obstructive pulmonary disease, unspecified: Secondary | ICD-10-CM | POA: Diagnosis not present

## 2018-07-09 ENCOUNTER — Ambulatory Visit: Payer: Medicare Other | Admitting: Internal Medicine

## 2018-07-19 DIAGNOSIS — J449 Chronic obstructive pulmonary disease, unspecified: Secondary | ICD-10-CM | POA: Diagnosis not present

## 2018-07-31 DIAGNOSIS — J449 Chronic obstructive pulmonary disease, unspecified: Secondary | ICD-10-CM | POA: Diagnosis not present

## 2018-08-19 DIAGNOSIS — J449 Chronic obstructive pulmonary disease, unspecified: Secondary | ICD-10-CM | POA: Diagnosis not present

## 2018-08-31 DIAGNOSIS — J449 Chronic obstructive pulmonary disease, unspecified: Secondary | ICD-10-CM | POA: Diagnosis not present

## 2018-09-19 DIAGNOSIS — J449 Chronic obstructive pulmonary disease, unspecified: Secondary | ICD-10-CM | POA: Diagnosis not present

## 2018-10-01 DIAGNOSIS — J449 Chronic obstructive pulmonary disease, unspecified: Secondary | ICD-10-CM | POA: Diagnosis not present

## 2018-10-19 DIAGNOSIS — J449 Chronic obstructive pulmonary disease, unspecified: Secondary | ICD-10-CM | POA: Diagnosis not present

## 2018-10-31 DIAGNOSIS — J449 Chronic obstructive pulmonary disease, unspecified: Secondary | ICD-10-CM | POA: Diagnosis not present

## 2018-11-19 DIAGNOSIS — J449 Chronic obstructive pulmonary disease, unspecified: Secondary | ICD-10-CM | POA: Diagnosis not present

## 2018-11-28 ENCOUNTER — Ambulatory Visit: Payer: Medicare Other | Admitting: Internal Medicine

## 2018-12-01 DIAGNOSIS — J449 Chronic obstructive pulmonary disease, unspecified: Secondary | ICD-10-CM | POA: Diagnosis not present

## 2018-12-03 ENCOUNTER — Encounter: Payer: Self-pay | Admitting: Internal Medicine

## 2018-12-03 ENCOUNTER — Ambulatory Visit: Payer: Medicare Other | Admitting: Internal Medicine

## 2018-12-03 ENCOUNTER — Other Ambulatory Visit: Payer: Self-pay

## 2018-12-03 DIAGNOSIS — J9612 Chronic respiratory failure with hypercapnia: Secondary | ICD-10-CM

## 2018-12-03 DIAGNOSIS — J449 Chronic obstructive pulmonary disease, unspecified: Secondary | ICD-10-CM

## 2018-12-03 DIAGNOSIS — J9611 Chronic respiratory failure with hypoxia: Secondary | ICD-10-CM

## 2018-12-03 DIAGNOSIS — R600 Localized edema: Secondary | ICD-10-CM

## 2018-12-03 NOTE — Assessment & Plan Note (Signed)
HC03  36  05/16/14 corresponding to a pc02 above 55  - ono RA < 89% x 4 h 11 min > 09/12/2014  rec one liter per min hs and recheck = > done 9/141/6 with < 89% x 98m > no change rx  - HC03  11/09/2015  = 39  - HCO3 02/09/2016    = 40  - HC03  11/15/2016    = 36  - 07/04/2017  Walked RA x 3 laps @ 185 ft each stopped due to  desat to 85% then completed 3rd lap nl pace on 3lpm POC pulsed  - 12/03/2018   Walked 2lpmx two laps =  approx 561ft @ moderate pace - stopped due to end of study with sats of 91% at the end of the study but sob so rec 3lpm at highest level of activity with goal > 90% and help burn fat to get into neg cal balance      as  Of 12/03/2018  rec 2lpm sleeping, none sitting, and up to 3lpm with walking

## 2018-12-03 NOTE — Assessment & Plan Note (Addendum)
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/21/17 at resection of R rib chondrosarcoma - 05/28/2018  After extensive coaching inhaler device,  effectiveness =    90% so continue bevespi 2 bid   Pt is Group B in terms of symptom/risk and laba/lama therefore appropriate rx at this point >>>  Continue bevespi with contingencies reviewed  >>> f/u in 6 m, sooner prn    Pt informed of the seriousness of COVID 19 infection as a direct risk to their health  and safey and to those of their loved ones and should continue to wear facemask in public and minimize exposure to public locations but especially avoid any area or activity where non-close contacts are not observing distancing or wearing an appropriate face mask.

## 2018-12-03 NOTE — Assessment & Plan Note (Signed)
Venous dopplers 10/02/2015 > neg bilaterally   Continue lasix / f/u with PCP ? Add low dose aldacton but if so would need to monitor K and creatinine and best ordered by one provider   I had an extended discussion with the patient reviewing all relevant studies completed to date and  lasting 15 to 20 minutes of a 25 minute visit    Each maintenance medication was reviewed in detail including most importantly the difference between maintenance and prns and under what circumstances the prns are to be triggered using an action plan format that is not reflected in the computer generated alphabetically organized AVS.     Please see AVS for specific instructions unique to this visit that I personally wrote and verbalized to the the pt in detail and then reviewed with pt  by my nurse highlighting any  changes in therapy recommended at today's visit to their plan of care.

## 2018-12-03 NOTE — Patient Instructions (Signed)
Make sure you check your oxygen saturations at highest level of activity to be sure it stays over 90% and adjust upward to maintain this level if needed but remember to turn it back to previous settings when you stop (to conserve your supply).    No change in medications   Please schedule a follow up visit in 6 months but call sooner if needed

## 2018-12-03 NOTE — Progress Notes (Signed)
Subjective:    Patient ID: Randall Sawyer, male    DOB: 1955-03-28     MRN: [id number]    Brief patient profile:  62 yowm quit smoking 05/2017  first noticed doe x around 2005 first on just albuterol then added spiriva then symbicort then stiolto which works the best so far and referred to pulmonary clinic 05/27/2014 by Dr Dennard Schaumann for copd eval and proved to have a GOLD IV severity  07/23/14 with hypercarbia.   History of Present Illness  05/27/2014 1st [facility] office visit/ Yani Lal   Chief Complaint  Patient presents with  . Pulmonary Consult    referred by Dr. Dennard Schaumann SOB x 1 year. C/o of SOB, fluid retention, prod cough with yellow thick mucus.  Wheezing late in the evenings. Left sided abdominal pain.   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                                      spiriva 2 puffs each am  Plan B = Back up - Only use your albuterol as a rescue medication  Plan C = crisis - Your albuterol nebulizer can be used up to every  4 hours with the goal of not needing at all eventually  Prednisone 10 mg take  4 each am x 2 days,   2 each am x 2 days,  1 each am x 2 days and stop      06/11/2014 f/u ov/Darrell Leonhardt re: copd still smoking   Chief Complaint  Patient presents with  . Follow-up    fluid retention not as bad; cough; chest tightness;states here for paperwork;  last used ventolin 1 hours - has not tried rechallenge/ sleeps ok  Just walking to mb makes him  stop one half way going both ways though uphill to it.  Not needing neb as much  No noct or early am exac  Has not worked since 05/27/14 rec Plan A =  Automatic =  Symbicort 160 Take 2 puffs first thing in am and then another 2 puffs about 12 hours later.                                      spiriva 2 puffs each am     Plan B = Back up - Only use your albuterol as a rescue medication Plan C = crisis - Your albuterol nebulizer can be used up to every  4 hours with the goal of not needing at all eventually  The key is to stop smoking completely before smoking completely stops you!    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 Follow-up    Breathing is unchanged since the last visit. He has good days and bad days. He uses his albuterol inhaler once daily on average. He rarely uses neb.    Dyspnea:  [employer] shopping on 3lpm  Cough: min mucoid  Sleeping: 1lpm / bed / 3 pillows  SABA use: every evening p supper  02: 1lpm at rest, 3lpm with activity   rec No change rx   05/28/2018  f/u ov/Hansen Carino re:  GOLD IV/ still off 02 and maintained on Bevespi and 02 as below Chief Complaint  Patient presents with  . Follow-up    Breathing is unchanged and no new co's. He is using his rescue inhaler once per wk on average. He rarely uses neb.    Dyspnea:  Not doing much walking since virus Cough: none Sleeping: on side bed is flat/two pillows SABA use: rarely needs hfa and never neb  02: 2lpm hs none sitting with sats 92 and up to 3lpm when walking at 95%  rec No change in recommendations but you need to walk more  Please schedule a follow up visit in 6 months but call sooner if needed  - discuss low dose screening program p covid restrictions lifted    12/03/2018  f/u ov/Windsor Zirkelbach re:  GOLD  IV still off cigs, still crave them / maintain on bevespi   Chief Complaint  Patient presents with  . Follow-up    Breathing is overall doing well. He is using his albuterol inhaler about 2 x per wk and has not used neb recently.   Dyspnea:  About a quarter mile stops half way  Cough: little clear, occ am flares  Sleeping: on side/ bed is flat two pillows  SABA use: no neb need  02: at rest off 02 low 90s/ 3lpm sleeping,  1-2 lpm slow pace, not measruing sats    No obvious day to day or daytime variability or assoc   purulent sputum or mucus plugs or hemoptysis or cp or chest tightness, subjective wheeze or overt sinus or hb symptoms.   Sleeping  without nocturnal   exacerbation  of respiratory  c/o's or need for noct saba. Also denies any obvious fluctuation of symptoms with weather or environmental changes or other aggravating or alleviating factors except as outlined above   No unusual exposure hx or h/o childhood pna/ asthma or knowledge of premature birth.  Current Allergies, Complete Past Medical History, Past Surgical History, Family History, and Social History were reviewed in [employer] record.  ROS  The following are not active complaints unless bolded Hoarseness, sore throat, dysphagia, dental problems, itching, sneezing,  nasal congestion or discharge of excess mucus or purulent secretions, ear ache,   fever, chills, sweats, unintended wt loss or wt gain, classically pleuritic or exertional cp,  orthopnea pnd or arm/hand swelling  or leg swelling, presyncope, palpitations, abdominal pain, anorexia, nausea, vomiting, diarrhea  or change in bowel habits or change in bladder habits, change in stools or change in urine, dysuria, hematuria,  rash, arthralgias, visual complaints, headache, numbness, weakness or ataxia or problems with walking or coordination,  change in mood or  memory.        Current Meds  Medication Sig  . albuterol (PROVENTIL HFA;VENTOLIN HFA) 108 (90 Base) MCG/ACT inhaler Inhale  2 puffs into the  lungs every 4 (four) hours as needed for wheezing or shortness of breath.  Marland Kitchen BEVESPI AEROSPHERE 9-4.8 MCG/ACT AERO INHALE 2 PUFFS TWICE DAILY INTO LUNGS. TAKE 2 PUFFS FIRST IN MORNING AND 2 PUFFS BY MOUTH 12 HOURS LATER  . furosemide (LASIX) 40 MG tablet TAKE 1.5 TABLETS BY MOUTH DAILY  . ipratropium-albuterol (DUONEB) 0.5-2.5 (3) MG/3ML SOLN Take 3 mLs by nebulization every 6 (six) hours as needed.  . OXYGEN Inhale 3 L into the lungs at bedtime. And 3 lpm with exertion  . pantoprazole (PROTONIX) 40 MG tablet TAKE 1 TABLET BY MOUTH DAILY BEFORE BREAKFAST                  Objective:   Physical Exam  Pleasant amb obese wm nad / somewhat gruff voice  12/03/2018     231  06/11/2014           179 >  07/23/2014   184 > 09/03/2014 185 > 12/01/2014  196 > 03/03/2015  190 > 08/31/2015     192 > 09/28/2015  195  > 11/09/2015   186  > 02/09/2016     189> 11/15/2016  198 >  03/02/2017  204 > 07/04/2017  201> 10/04/2017 213  > 05/28/2018   225     05/27/14 180 lb (81.647 kg)  05/16/14 176 lb (79.833 kg)  05/09/14 180 lb (81.647 kg)       Vital signs reviewed - Note on arrival 02 sats  95% on 3lpm       Report full dentures  HEENT : pt wearing mask not removed for exam due to covid - 19 concerns.    NECK :  without JVD/Nodes/TM/ nl carotid upstrokes bilaterally   LUNGS: no acc muscle use,  Mild barrel  contour chest wall with bilateral  Distant bs s audible wheeze and  without cough on insp or exp maneuvers  and mild  Hyperresonant  to  percussion bilaterally     CV:  RRR  no s3 or murmur or increase in P2, and trace to 1+ sym LE pitting edema   ABD: almost tensely soft and nontender with pos end  insp Hoover's  in the supine position. No bruits or organomegaly appreciated, bowel sounds nl  MS:   Nl gait/  ext warm without deformities, calf tenderness, cyanosis or clubbing No obvious joint restrictions   SKIN: warm and dry without lesions    NEURO:  alert, approp, nl sensorium with  no motor or  cerebellar deficits apparent.

## 2018-12-12 ENCOUNTER — Other Ambulatory Visit: Payer: Self-pay | Admitting: Internal Medicine

## 2018-12-18 ENCOUNTER — Telehealth: Payer: Self-pay | Admitting: Internal Medicine

## 2018-12-18 MED ORDER — FUROSEMIDE 40 MG PO TABS: ORAL_TABLET | ORAL | 0 refills | Status: DC

## 2018-12-18 NOTE — Telephone Encounter (Signed)
Ok to refill x one but send copy of this phone note to his pcp so it's clear we won't be doing the refills going forward unless of course Dr Caren Griffins wishes [place] to do so.  thx

## 2018-12-18 NOTE — Telephone Encounter (Signed)
I called pt and advised him that MW was questioning whether this medication for Lasix can be written by his PCP. I advised him of this and stated MW name was on the Rx. I told him that I could see if he could refill the medication once more but he would have to call his primary care for future refills. MW please advise if ok to send a 30 day refill.    Assessment & Plan Note by Tanda Rockers, MD at 12/03/2018 9:42 AM Author: Tanda Rockers, MD Author Type: Physician Filed: 12/03/2018 9:43 AM  Note Status: Written Cosign: Cosign Not Required Encounter Date: 12/03/2018  Problem: Bilateral leg edema  Editor: Tanda Rockers, MD (Physician)    Venous dopplers 10/02/2015 > neg bilaterally   Continue lasix / f/u with PCP ? Add low dose aldacton but if so would need to monitor K and creatinine and best ordered by one provider   I had an extended discussion with the patient reviewing all relevant studies completed to date and  lasting 15 to 20 minutes of a 25 minute visit    Each maintenance medication was reviewed in detail including most importantly the difference between maintenance and prns and under what circumstances the prns are to be triggered using an action plan format that is not reflected in the computer generated alphabetically organized AVS.     Please see AVS for specific instructions unique to this visit that I personally wrote and verbalized to the the pt in detail and then reviewed with pt  by my nurse highlighting any  changes in therapy recommended at today's visit to their plan of care.

## 2018-12-18 NOTE — Telephone Encounter (Signed)
Spoke with pt, aware that we will send in 1 refill of Lasix and that further refills need to come from PCP.  Will send this phone note to PCP as well.  Nothing further needed at this time- will close encounter.

## 2018-12-19 DIAGNOSIS — J449 Chronic obstructive pulmonary disease, unspecified: Secondary | ICD-10-CM | POA: Diagnosis not present

## 2018-12-31 DIAGNOSIS — J449 Chronic obstructive pulmonary disease, unspecified: Secondary | ICD-10-CM | POA: Diagnosis not present

## 2019-01-19 DIAGNOSIS — J449 Chronic obstructive pulmonary disease, unspecified: Secondary | ICD-10-CM | POA: Diagnosis not present

## 2019-01-31 DIAGNOSIS — J449 Chronic obstructive pulmonary disease, unspecified: Secondary | ICD-10-CM | POA: Diagnosis not present

## 2019-02-05 ENCOUNTER — Other Ambulatory Visit: Payer: Self-pay | Admitting: Internal Medicine

## 2019-02-05 MED ORDER — BEVESPI AEROSPHERE 9-4.8 MCG/ACT IN AERO
2.0000 | INHALATION_SPRAY | Freq: Two times a day (BID) | RESPIRATORY_TRACT | 11 refills | Status: DC
Start: 2019-02-05 — End: 2020-01-02

## 2019-02-18 DIAGNOSIS — L400 Psoriasis vulgaris: Secondary | ICD-10-CM | POA: Diagnosis not present

## 2019-02-19 DIAGNOSIS — J449 Chronic obstructive pulmonary disease, unspecified: Secondary | ICD-10-CM | POA: Diagnosis not present

## 2019-02-27 ENCOUNTER — Other Ambulatory Visit: Payer: Self-pay | Admitting: Family Medicine

## 2019-02-27 MED ORDER — FUROSEMIDE 40 MG PO TABS: ORAL_TABLET | ORAL | 0 refills | Status: DC

## 2019-03-01 ENCOUNTER — Telehealth (INDEPENDENT_AMBULATORY_CARE_PROVIDER_SITE_OTHER): Payer: Self-pay | Admitting: Gastroenterology

## 2019-03-01 ENCOUNTER — Other Ambulatory Visit (INDEPENDENT_AMBULATORY_CARE_PROVIDER_SITE_OTHER): Payer: Self-pay | Admitting: Gastroenterology

## 2019-03-01 MED ORDER — PANTOPRAZOLE SODIUM 40 MG PO TBEC: DELAYED_RELEASE_TABLET | ORAL | 1 refills | Status: DC

## 2019-03-01 NOTE — Telephone Encounter (Signed)
Refill request received. Will refill protonix but last seen 2017 and needs office visit for future refills or can request from pcp.

## 2019-03-03 DIAGNOSIS — J449 Chronic obstructive pulmonary disease, unspecified: Secondary | ICD-10-CM | POA: Diagnosis not present

## 2019-03-19 DIAGNOSIS — J449 Chronic obstructive pulmonary disease, unspecified: Secondary | ICD-10-CM | POA: Diagnosis not present

## 2019-03-31 DIAGNOSIS — J449 Chronic obstructive pulmonary disease, unspecified: Secondary | ICD-10-CM | POA: Diagnosis not present

## 2019-04-19 DIAGNOSIS — J449 Chronic obstructive pulmonary disease, unspecified: Secondary | ICD-10-CM | POA: Diagnosis not present

## 2019-04-20 ENCOUNTER — Other Ambulatory Visit: Payer: Self-pay | Admitting: Family Medicine

## 2019-04-20 ENCOUNTER — Other Ambulatory Visit (INDEPENDENT_AMBULATORY_CARE_PROVIDER_SITE_OTHER): Payer: Self-pay | Admitting: Gastroenterology

## 2019-04-22 ENCOUNTER — Other Ambulatory Visit (INDEPENDENT_AMBULATORY_CARE_PROVIDER_SITE_OTHER): Payer: Self-pay | Admitting: Internal Medicine

## 2019-04-22 NOTE — Telephone Encounter (Signed)
Patient will need to have OV prior to further refills. He has not been seen since 2017. Per Dr.Rehman he is to see Thayer Headings.

## 2019-05-01 DIAGNOSIS — J449 Chronic obstructive pulmonary disease, unspecified: Secondary | ICD-10-CM | POA: Diagnosis not present

## 2019-05-02 ENCOUNTER — Other Ambulatory Visit: Payer: Self-pay | Admitting: Family Medicine

## 2019-05-19 DIAGNOSIS — J449 Chronic obstructive pulmonary disease, unspecified: Secondary | ICD-10-CM | POA: Diagnosis not present

## 2019-05-31 DIAGNOSIS — J449 Chronic obstructive pulmonary disease, unspecified: Secondary | ICD-10-CM | POA: Diagnosis not present

## 2019-06-03 ENCOUNTER — Ambulatory Visit: Payer: Medicare Other | Admitting: Internal Medicine

## 2019-06-07 DIAGNOSIS — Z9889 Other specified postprocedural states: Secondary | ICD-10-CM | POA: Diagnosis not present

## 2019-06-07 DIAGNOSIS — R911 Solitary pulmonary nodule: Secondary | ICD-10-CM | POA: Diagnosis not present

## 2019-06-07 DIAGNOSIS — Z85830 Personal history of malignant neoplasm of bone: Secondary | ICD-10-CM | POA: Diagnosis not present

## 2019-06-07 DIAGNOSIS — Z08 Encounter for follow-up examination after completed treatment for malignant neoplasm: Secondary | ICD-10-CM | POA: Diagnosis not present

## 2019-06-07 DIAGNOSIS — C413 Malignant neoplasm of ribs, sternum and clavicle: Secondary | ICD-10-CM | POA: Diagnosis not present

## 2019-06-07 DIAGNOSIS — J449 Chronic obstructive pulmonary disease, unspecified: Secondary | ICD-10-CM | POA: Diagnosis not present

## 2019-06-07 DIAGNOSIS — Z87891 Personal history of nicotine dependence: Secondary | ICD-10-CM | POA: Diagnosis not present

## 2019-06-19 DIAGNOSIS — J449 Chronic obstructive pulmonary disease, unspecified: Secondary | ICD-10-CM | POA: Diagnosis not present

## 2019-07-01 DIAGNOSIS — J449 Chronic obstructive pulmonary disease, unspecified: Secondary | ICD-10-CM | POA: Diagnosis not present

## 2019-07-03 ENCOUNTER — Ambulatory Visit: Payer: Medicare Other | Admitting: Internal Medicine

## 2019-07-03 ENCOUNTER — Other Ambulatory Visit: Payer: Self-pay

## 2019-07-03 ENCOUNTER — Encounter: Payer: Self-pay | Admitting: Internal Medicine

## 2019-07-03 DIAGNOSIS — J9612 Chronic respiratory failure with hypercapnia: Secondary | ICD-10-CM | POA: Diagnosis not present

## 2019-07-03 DIAGNOSIS — J9611 Chronic respiratory failure with hypoxia: Secondary | ICD-10-CM

## 2019-07-03 DIAGNOSIS — J449 Chronic obstructive pulmonary disease, unspecified: Secondary | ICD-10-CM

## 2019-07-03 NOTE — Patient Instructions (Signed)
No change in medications  I strongly recommend the covid vaccine at [facility] based on safety and effectiveness   Please schedule a follow up visit in 6  months but call sooner if needed at [place] office

## 2019-07-03 NOTE — Progress Notes (Signed)
Subjective:    Patient ID: Randall Sawyer, male    DOB: 08/27/1955     MRN: [id number]    Brief patient profile:  65 yowm quit smoking 05/2017  first noticed doe x around 2005 first on just albuterol then added spiriva then symbicort then stiolto which works the best so far and referred to pulmonary clinic 05/27/2014 by Dr Dennard Schaumann for copd eval and proved to have a GOLD IV severity  07/23/14 with hypercarbia.   History of Present Illness  05/27/2014 1st [facility] office visit/ Randall Sawyer   Chief Complaint  Patient presents with  . Pulmonary Consult    referred by Dr. Dennard Schaumann SOB x 1 year. C/o of SOB, fluid retention, prod cough with yellow thick mucus.  Wheezing late in the evenings. Left sided abdominal pain.   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                                      spiriva 2 puffs each am  Plan B = Back up - Only use your albuterol as a rescue medication  Plan C = crisis - Your albuterol nebulizer can be used up to every  4 hours with the goal of not needing at all eventually  Prednisone 10 mg take  4 each am x 2 days,   2 each am x 2 days,  1 each am x 2 days and stop      06/11/2014 f/u ov/Randall Sawyer re: copd still smoking   Chief Complaint  Patient presents with  . Follow-up    fluid retention not as bad; cough; chest tightness;states here for paperwork;  last used ventolin 1 hours - has not tried rechallenge/ sleeps ok  Just walking to mb makes him  stop one half way going both ways though uphill to it.  Not needing neb as much  No noct or early am exac  Has not worked since 05/27/14 rec Plan A =  Automatic =  Symbicort 160 Take 2 puffs first thing in am and then another 2 puffs about 12 hours later.                                      spiriva 2 puffs each am     Plan B = Back up - Only use your albuterol as a rescue medication Plan C = crisis - Your albuterol nebulizer can be used up to every  4 hours with the goal of not needing at all eventually  The key is to stop smoking completely before smoking completely stops you!    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 Follow-up    Breathing is unchanged since the last visit. He has good days and bad days. He uses his albuterol inhaler once daily on average. He rarely uses neb.   Dyspnea:  [employer] shopping on 3lpm  Cough: min mucoid  Sleeping: 1lpm / bed / 3 pillows  SABA use: every evening p supper  02: 1lpm at rest, 3lpm with activity   rec No change rx   05/28/2018  f/u ov/Randall Sawyer re:  GOLD IV/ still off 02 and maintained on Bevespi and 02 as below Chief Complaint  Patient presents with  . Follow-up    Breathing is unchanged and no new co's. He is using his rescue inhaler once per wk on average. He rarely uses neb.    Dyspnea:  Not doing much walking since virus Cough: none Sleeping: on side bed is flat/two pillows SABA use: rarely needs hfa and never neb  02: 2lpm hs none sitting with sats 92 and up to 3lpm when walking at 95%  rec No change in recommendations but you need to walk more  Please schedule a follow up visit in 6 months but call sooner if needed  - discuss low dose screening program p covid restrictions lifted    12/03/2018  f/u ov/Randall Sawyer re:  GOLD  IV still off cigs, still crave them / maintain on bevespi    Chief Complaint  Patient presents with  . Follow-up    Breathing is overall doing well. He is using his albuterol inhaler about 2 x per wk and has not used neb recently.   Dyspnea:  About a quarter mile stops half way  Cough: little clear, occ am flares  Sleeping: on side/ bed is flat two pillows  SABA use: no neb need  02: at rest off 02 low 90s/ 3lpm sleeping,  1-2 lpm slow pace, not measruing sats  rec Make sure you check your oxygen saturations at highest level of activity to be sure it stays over 90%  No change in medications   07/03/2019  f/u ov/Randall Sawyer re:  GOLD IV / no vaccination / bevespi Chief Complaint  Patient presents with  . Follow-up   Dyspnea:  Able walk x one quarter mile on 3lpm with sats low 90s Cough: min Sleeping: bed flat/ 2 pillows  SABA use: hfa only maybe up to twice daily / never noct/ never  02: 3lpm 24/7    No obvious day to day or daytime variability or assoc excess/ purulent sputum or mucus plugs or hemoptysis or cp or chest tightness, subjective wheeze or overt sinus or hb symptoms.   Sleeping as above  without nocturnal  or early am exacerbation  of respiratory  c/o's or need for noct saba. Also denies any obvious fluctuation of symptoms with weather or environmental changes or other aggravating or alleviating factors except as outlined above   No unusual exposure hx or h/o childhood pna/ asthma or knowledge of premature birth.  Current Allergies, Complete Past Medical History, Past Surgical History, Family History, and Social History were reviewed in [employer] record.  ROS  The following are not active complaints unless bolded Hoarseness, sore throat, dysphagia, dental problems, itching, sneezing,  nasal congestion or discharge of excess mucus or purulent secretions, ear ache,   fever, chills, sweats, unintended wt  loss or wt gain, classically pleuritic or exertional cp,  orthopnea pnd or arm/hand swelling  or leg swelling,  presyncope, palpitations, abdominal pain, anorexia, nausea, vomiting, diarrhea  or change in bowel habits or change in bladder habits, change in stools or change in urine, dysuria, hematuria,  rash, arthralgias, visual complaints, headache, numbness, weakness or ataxia or problems with walking or coordination,  change in mood or  memory.        Current Meds  Medication Sig  . furosemide (LASIX) 40 MG tablet TAKE 1 AND 1/2 TABLETS BY MOUTH DAILY  . Glycopyrrolate-Formoterol (BEVESPI AEROSPHERE) 9-4.8 MCG/ACT AERO Inhale 2 puffs into the lungs 2 (two) times daily.  Marland Kitchen ipratropium-albuterol (DUONEB) 0.5-2.5 (3) MG/3ML SOLN Take 3 mLs by nebulization every 6 (six) hours as needed.  . OXYGEN Inhale 3 L into the lungs at bedtime. And 3 lpm with exertion  . pantoprazole (PROTONIX) 40 MG tablet TAKE 1 TABLET BY MOUTH DAILY BEFORE BREAKFAST  . PROVENTIL HFA 108 (90 Base) MCG/ACT inhaler INHALE 2 PUFFS BY MOUTH EVERY 4 HOURS                   Objective:   Physical Exam   07/03/2019       227  12/03/2018     231  06/11/2014           179 >  07/23/2014   184 > 09/03/2014 185 > 12/01/2014  196 > 03/03/2015  190 > 08/31/2015     192 > 09/28/2015  195  > 11/09/2015   186  > 02/09/2016     189> 11/15/2016  198 >  03/02/2017  204 > 07/04/2017  201> 10/04/2017 213  > 05/28/2018   225     05/27/14 180 lb (81.647 kg)  05/16/14 176 lb (79.833 kg)  05/09/14 180 lb (81.647 kg)     Vital signs reviewed  07/03/2019  - Note at rest 02 sats  98% on 3lpm cont        Report full dentures     HEENT : pt wearing mask not removed for exam due to covid - 19 concerns.    NECK :  without JVD/Nodes/TM/ nl carotid upstrokes bilaterally   LUNGS: no acc muscle use,  Mild barrel  contour chest wall with bilateral  Distant bs s audible wheeze and  without cough on insp or exp maneuvers  and mild  Hyperresonant  to  percussion bilaterally     CV:  RRR  no s3 or murmur or increase in P2, and 1+ pitting edema both LE's R > L   (baseline)   ABD:  soft and nontender with pos end  insp Hoover's  in the supine position. No bruits or organomegaly appreciated, bowel sounds nl  MS:   Nl gait/  ext warm without deformities, calf tenderness, cyanosis or clubbing No obvious joint restrictions   SKIN: warm and dry without lesions    NEURO:  alert, approp, nl sensorium with  no motor or cerebellar deficits apparent.

## 2019-07-04 ENCOUNTER — Encounter: Payer: Self-pay | Admitting: Internal Medicine

## 2019-07-04 NOTE — Assessment & Plan Note (Signed)
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/21/17 at resection of R rib chondrosarcoma - 05/28/2018  After extensive coaching inhaler device,  effectiveness =    90% so continue bevespi 2 bid   Pt is Group B in terms of symptom/risk and laba/lama therefore appropriate rx at this point >>>  Continue bevespi unless starts having aecopd then consider breztri per ATS guidelines  Pt informed of the seriousness of COVID 19 infection as a direct risk to lung health  and safey and to close contacts and should continue to wear a facemask in public and minimize exposure to public locations but especially avoid any area or activity where non-close contacts are not observing distancing or wearing an appropriate face mask.  I strongly recommended she take either of the vaccines available through local drugstores based on updated information on millions of Americans treated with the [employer] products  which have proven both safe and  effective even against the new delta variant.

## 2019-07-04 NOTE — Assessment & Plan Note (Signed)
HC03  36  05/16/14 corresponding to a pc02 above 55  - ono RA < 89% x 4 h 11 min > 09/12/2014  rec one liter per min hs and recheck = > done 9/141/6 with < 89% x 17m > no change rx  - HC03  11/09/2015  = 39  - HCO3 02/09/2016    = 40  - HC03  11/15/2016    = 36  - 07/04/2017  Walked RA x 3 laps @ 185 ft each stopped due to  desat to 85% then completed 3rd lap nl pace on 3lpm POC pulsed  - 12/03/2018   Walked 2lpmx two laps =  approx 565ft @ moderate pace - stopped due to end of study with sats of 91% at the end of the study but sob so rec 3lpm at highest level of activity with goal > 90% and help burn fat to get into neg cal balance      as  Of 12/03/2018  rec  3lpm 24/7           Each maintenance medication was reviewed in detail including emphasizing most importantly the difference between maintenance and prns and under what circumstances the prns are to be triggered using an action plan format where appropriate.  Total time for H and P, chart review, counseling, reviewing  devices and generating customized AVS unique to this office visit / charting = 20 min

## 2019-07-19 DIAGNOSIS — J449 Chronic obstructive pulmonary disease, unspecified: Secondary | ICD-10-CM | POA: Diagnosis not present

## 2019-07-24 ENCOUNTER — Ambulatory Visit (INDEPENDENT_AMBULATORY_CARE_PROVIDER_SITE_OTHER): Payer: Medicare Other | Admitting: Gastroenterology

## 2019-07-31 DIAGNOSIS — J449 Chronic obstructive pulmonary disease, unspecified: Secondary | ICD-10-CM | POA: Diagnosis not present

## 2019-08-19 DIAGNOSIS — J449 Chronic obstructive pulmonary disease, unspecified: Secondary | ICD-10-CM | POA: Diagnosis not present

## 2019-08-31 DIAGNOSIS — J449 Chronic obstructive pulmonary disease, unspecified: Secondary | ICD-10-CM | POA: Diagnosis not present

## 2019-09-17 ENCOUNTER — Telehealth: Payer: Self-pay | Admitting: Internal Medicine

## 2019-09-17 NOTE — Telephone Encounter (Signed)
Would have to come by for qualifying sats on our POC to be sure it's capable of meeting his needs or 3rd parties won't pay for it/

## 2019-09-17 NOTE — Telephone Encounter (Signed)
Patient contacted, OV with NP made for qualifying walk on POC.

## 2019-09-17 NOTE — Telephone Encounter (Signed)
Spoke with patient regarding prior message. Patient wants a script sent over to [facility] for Randall Sawyer.Dr.Wert will it be ok with me placing a order for POC to [facility] last office visit 07/03/19.Dr.Wert can you please advise . Thank you.

## 2019-09-19 ENCOUNTER — Ambulatory Visit (INDEPENDENT_AMBULATORY_CARE_PROVIDER_SITE_OTHER): Payer: Medicare Other | Admitting: Gastroenterology

## 2019-09-19 ENCOUNTER — Encounter (INDEPENDENT_AMBULATORY_CARE_PROVIDER_SITE_OTHER): Payer: Self-pay | Admitting: Gastroenterology

## 2019-09-19 ENCOUNTER — Other Ambulatory Visit: Payer: Self-pay

## 2019-09-19 VITALS — BP 139/69 | HR 91 | Temp 98.8°F | Ht 68.0 in | Wt 228.3 lb

## 2019-09-19 DIAGNOSIS — J449 Chronic obstructive pulmonary disease, unspecified: Secondary | ICD-10-CM | POA: Diagnosis not present

## 2019-09-19 DIAGNOSIS — K219 Gastro-esophageal reflux disease without esophagitis: Secondary | ICD-10-CM

## 2019-09-19 MED ORDER — PANTOPRAZOLE SODIUM 40 MG PO TBEC: DELAYED_RELEASE_TABLET | ORAL | 3 refills | Status: DC

## 2019-09-19 NOTE — Patient Instructions (Signed)
We are refilling your Protonix 40 mg once a day.  Please call us with any issues.  We will try to get labs from primary care office.

## 2019-09-19 NOTE — Progress Notes (Signed)
Patient profile: Randall Sawyer is a 64 y.o. male seen for evaluation of GERD, last seen 11/2015  History of Present Illness: Randall Sawyer is seen today for evaluation of GERD.  He has been maintained on Protonix 40 mg once a day for several years.  He denies any GERD symptoms as long as he takes the Protonix.  He denies any dysphagia, nausea, vomiting.  No epigastric pain.  He has some bloating that has been ongoing for many years and had an ultrasound in 2016 for abdominal distention that was negative for ascites.  He is having a bowel movement daily denies any constipation, diarrhea, melena, rectal bleeding.  He is a former smoker but stopped a few years ago.  Prior alcohol but none currently.  He has been on 3 L of oxygen for about 2 years.  He does have some issues with lower extremity edema despite Lasix 60 mg and wonders if he needs a different dose, I did recommend discussing this with PCP  Wt Readings from Last 3 Encounters:  09/19/19 228 lb 4.8 oz (103.6 kg)  07/03/19 227 lb (103 kg)  12/03/18 231 lb (104.8 kg)     Last Colonoscopy: none prior    Last Endoscopy: 2017-Impression: Small sliding hiatal hernia without evidence of erosive esophagitis. Fine nodularity to antral mucosa with single prepyloric polyp. Gastric erosions have healed since last EGD of 11/29/2014. Biopsies taken from antral mucosa and also from prepyloric polyp and submitted separately.    Past Medical History:  Past Medical History:  Diagnosis Date  . Asthma   . 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 COPD (chronic obstructive pulmonary disease) ([place])   . Hypertension   . Nephrolithiasis   . Tobacco abuse     Problem List: Patient Active Problem List   Diagnosis Date Noted  . Olecranon bursitis of both elbows 03/01/2018  . 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 Bone mass 03/28/2017  . Pulmonary infiltrates  03/02/2017  . Chronic respiratory failure with hypoxia and hypercapnia ([place]) 07/27/2014  . Bilateral leg edema 02/14/2014  . Dyspnea 01/21/2014  . Kidney stones   . 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     Past Surgical History: Past Surgical History:  Procedure Laterality Date  . BIOPSY N/A 02/26/2015   Procedure: BIOPSY;  Surgeon: Rogene Houston, MD;  [facility];  Service: Endoscopy;  Laterality: N/A;  Gastric biopsies  . ESOPHAGOGASTRODUODENOSCOPY N/A 11/19/2014   Procedure: ESOPHAGOGASTRODUODENOSCOPY (EGD);  Surgeon: Rogene Houston, MD;  Location: [facility];  Service: Endoscopy;  Laterality: N/A;  2:15-rescheduled to 11/9 @1 :72 Ann notified pt  . ESOPHAGOGASTRODUODENOSCOPY N/A 02/26/2015   Procedure: ESOPHAGOGASTRODUODENOSCOPY (EGD);  Surgeon: Rogene Houston, MD;  [facility]: [facility];  Service: Endoscopy;  Laterality: N/A;  1225    Allergies: No Known Allergies    Home Medications:  Current Outpatient Medications:  .  furosemide (LASIX) 40 MG tablet, TAKE 1 AND 1/2 TABLETS BY MOUTH DAILY, Disp: 135 tablet, Rfl: 1 .  Glycopyrrolate-Formoterol (BEVESPI AEROSPHERE) 9-4.8 MCG/ACT AERO, Inhale 2 puffs into the lungs 2 (two) times daily., Disp: 10.7 g, Rfl: 11 .  ipratropium-albuterol (DUONEB) 0.5-2.5 (3) MG/3ML SOLN, Take 3 mLs by nebulization every 6 (six) hours as needed., Disp: , Rfl:  .  OXYGEN, Inhale 3 L into the lungs at bedtime. And 3 lpm with exertion, Disp: ,  Rfl:  .  pantoprazole (PROTONIX) 40 MG tablet, Take 1 tablet PO qAM 30 min prior to breakfast, Disp: 90 tablet, Rfl: 3 .  PROVENTIL HFA 108 (90 Base) MCG/ACT inhaler, INHALE 2 PUFFS BY MOUTH EVERY 4 HOURS, Disp: 18 g, Rfl: 0   Family History: family history includes Asthma in his father; COPD in his father; Diabetes in his sister; Diabetes type II in his mother; Heart disease in his father.    Social History:   reports that he quit smoking about 2 years ago. His smoking use included cigarettes. He started  smoking about 54 years ago. He has a 12.75 pack-year smoking history. He has never used smokeless tobacco. He reports that he does not drink alcohol and does not use drugs.   Review of Systems: Constitutional: Denies weight loss/weight gain  Eyes: No changes in vision. ENT: No oral lesions, sore throat.  GI: see HPI.  Heme/Lymph: No easy bruising.  CV: No chest pain.  GU: No hematuria.  Integumentary: No rashes.  Neuro: No headaches.  Psych: No depression/anxiety.  Endocrine: No heat/cold intolerance.  Allergic/Immunologic: No urticaria.  Resp: No cough, SOB.  Musculoskeletal: Positive edema   Physical Examination: BP 139/69 (BP Location: Left Arm, Patient Position: Sitting, Cuff Size: Large)   Pulse 91   Temp 98.8 F (37.1 C) (Oral)   Ht 5\' 8"  (1.727 m)   Wt 228 lb 4.8 oz (103.6 kg)   BMI 34.71 kg/m  Gen: NAD, alert and oriented x 4. On 3L O2.  HEENT: PEERLA, EOMI, Neck: supple, no JVD Chest: lungs clear to auscultation bilaterally.  CV: RRR, no m/g/c/r Abd: soft, NT, ND, +BS in all four quadrants; no HSM, guarding, ridigity, or rebound tenderness Ext: 1+ bilateral lower extremity edema  Skin: no rash or lesions noted on observed skin Lymph: no noted LAD  Data Reviewed:  [place] ABD 2016-IMPRESSION: No ascites identified. Fatty infiltration of liver.   Requesting last labs from Dr Samella Parr office   Assessment/Plan: Mr. Benning is a 64 y.o. male for follow-up of  1.  GERD-asymptomatic on Protonix 40 mg once a day which he will continue.  He has no upper GI symptoms.  Diet modifications reviewed.  Last EGD 2017 without Barrett's esophagus  2.  Colon cancer screening-none prior and denies colonoscopy.  He is concerned over sedation given his prior issues with sedation.  Reviewed lower GI alarm symptoms with him-to contact me if develops.  Currently moving bowels well.   Randall Sawyer was seen today for follow-up.  Diagnoses and all orders for this visit:  Chronic  GERD  Other orders -     pantoprazole (PROTONIX) 40 MG tablet; Take 1 tablet PO qAM 30 min prior to breakfast    F/up 1 year - sooner if needed  I personally performed the service, non-incident to. (WP)  Laurine Blazer, [facility]

## 2019-09-25 ENCOUNTER — Encounter: Payer: Self-pay | Admitting: Acute Care

## 2019-09-25 ENCOUNTER — Ambulatory Visit (INDEPENDENT_AMBULATORY_CARE_PROVIDER_SITE_OTHER): Payer: Medicare Other | Admitting: Acute Care

## 2019-09-25 ENCOUNTER — Other Ambulatory Visit: Payer: Self-pay

## 2019-09-25 VITALS — BP 130/80 | HR 110 | Temp 97.6°F | Ht 68.0 in | Wt 230.0 lb

## 2019-09-25 DIAGNOSIS — J449 Chronic obstructive pulmonary disease, unspecified: Secondary | ICD-10-CM | POA: Diagnosis not present

## 2019-09-25 NOTE — Patient Instructions (Addendum)
It is good to see you today. We have walked you on the POC device today  to qualify you for the portable oxygen tank.  You do Qualify for POC, as the oxygen set a 5 liters maintains your sats at > 88%.  We will place an order for a POC through your DME. Continue wearing oxygen at 2 L at rest and 3 L with exertion. Wear the portable tank at 5 L with exertion, as this is pulsed oxygen, so you need a higher flow..   Continue using albuterol as needed for shortness of breath or wheezing. Continue Bevespi as you have been doing Rinse mouth after use.  Note your daily symptoms > remember "red flags" for COPD:  Increase in cough, increase in sputum production, increase in shortness of breath or activity intolerance. If you notice these symptoms, please call to be seen.   Follow up with Dr. [name] in November 2021 at [facility] office  Please contact office for sooner follow up if symptoms do not improve or worsen or seek emergency care

## 2019-09-25 NOTE — Progress Notes (Signed)
History of Present Illness Randall Sawyer is a 64 y.o. male former smoker ( Quit 2019) with GOLD IV with hypercapnia and hypoxemia, on home oxygen. He is followed by Dr. [name]. Maintenance Medication >> Bevespi Rescue >> Albuterol Home oxygen at 2 L at rest 3 L with exertion  09/25/2019  Pt. presents for qualifying walk on POC to ensure it meets his oxygen needs . Pt was walked by Nursing.He tolerated the POC on 5 L . We discussed that he will need to wear the POC at 5 L. As it is pulsed oxygen and he will need a higher flow.  He states his secretions are at baseline. He is compliant with his Bevespi . He uses his albuterol about 2-3 times a week. He denies any fever, chest pain, orthopnea or hemoptysis.   Test Results:  CT Chest 03/16/2017 The finding on the recent chest radiograph corresponds to an expansile predominantly lytic lesion in the anterolateral aspect of the right sixth rib which has a significant soft tissue component. 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 2. In addition, there multiple small pulmonary nodules scattered throughout the lungs bilaterally, largest of which is a mixed solid and sub solid nodule in the medial aspect of the left upper lobe measuring 13 x 7 mm, with a 3 mm central solid component. Follow-up non-contrast CT recommended at 3-6 months to confirm persistence. If unchanged, and solid component remains <6 mm, annual CT is recommended until 5 years of stability has been established. If persistent these nodules should be considered highly suspicious if the solid component of the nodule is 6 mm or greater in size and enlarging.   03/27/2017 PET Expansile hypermetabolic right sixth anterior rib lesion suspicious for primary osseous neoplasm such as chondrosarcoma. Recommend biopsy. Metastatic focus is possible but I do not see any primary  neoplasm. There also appears to be chondroid matrix. 2. Left upper lobe ground-glass nodule is stable. No obvious hypermetabolism. Other small nodules are unchanged. Recommend Surveillance.  Pt had ribs removed 05/2017. He has follow up imaging per Dr. [name]         CBC Latest Ref Rng & Units 04/03/2017 11/15/2016 08/31/2015  WBC 4.0 - 10.5 K/uL 9.8 7.9 9.2  Hemoglobin 13.0 - 17.0 g/dL 15.3 15.5 15.8  Hematocrit 39 - 52 % 47.1 46.7 45.9  Platelets 150 - 400 K/uL 292 245.0 250.0    BMP Latest Ref Rng & Units 11/15/2016 02/09/2016 11/09/2015  Glucose 70 - 99 mg/dL 97 109(H) 97  BUN 6 - 23 mg/dL 16 24(H) 17  Creatinine 0.40 - 1.50 mg/dL 1.29 1.44 1.35  Sodium 135 - 145 mEq/L 136 136 136  Potassium 3.5 - 5.1 mEq/L 4.4 4.1 4.6  Chloride 96 - 112 mEq/L 93(L) 93(L) 90(L)  CO2 19 - 32 mEq/L 36(H) 40(H) 39(H)  Calcium 8.4 - 10.5 mg/dL 9.6 9.8 9.7    BNP    Component Value Date/Time   BNP 13.3 12/27/2013 1022    ProBNP    Component Value Date/Time   PROBNP 25.0 08/31/2015 0935    PFT    Component Value Date/Time   FEV1PRE 0.72 07/23/2014 1146   FEV1POST 0.84 07/23/2014 1146   FVCPRE 1.64 07/23/2014 1146   FVCPOST 1.91 07/23/2014 1146   DLCOUNC 19.41 07/23/2014 1146   PREFEV1FVCRT 44 07/23/2014 1146   PSTFEV1FVCRT 44 07/23/2014 1146    No results found.  Past medical hx Past Medical History:  Diagnosis Date  . Asthma   . 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 COPD (chronic obstructive pulmonary disease) ([place])   . Hypertension   . Nephrolithiasis   . Tobacco abuse      Social History   Tobacco Use  . Smoking status: Former Smoker    Packs/day: 0.25    Years: 51.00    Pack years: 12.75    Types: Cigarettes    Start date: 12/30/1964    Quit date: 05/21/2017    Years since quitting: 2.3  . Smokeless tobacco: Never Used  Vaping Use  . Vaping Use: Never used  Substance Use Topics  . Alcohol use: No    Alcohol/week: 0.0 standard  drinks  . Drug use: No    Mr.Rogala reports that he quit smoking about 2 years ago. His smoking use included cigarettes. He started smoking about 54 years ago. He has a 12.75 pack-year smoking history. He has never used smokeless tobacco. He reports that he does not drink alcohol and does not use drugs.  Tobacco Cessation: Former smoker  Past surgical hx, Family hx, Social hx all reviewed.  Current Outpatient Medications on File Prior to Visit  Medication Sig  . furosemide (LASIX) 40 MG tablet TAKE 1 AND 1/2 TABLETS BY MOUTH DAILY  . Glycopyrrolate-Formoterol (BEVESPI AEROSPHERE) 9-4.8 MCG/ACT AERO Inhale 2 puffs into the lungs 2 (two) times daily.  Marland Kitchen ipratropium-albuterol (DUONEB) 0.5-2.5 (3) MG/3ML SOLN Take 3 mLs by nebulization every 6 (six) hours as needed.  . OXYGEN Inhale 3 L into the lungs at bedtime. And 3 lpm with exertion  . pantoprazole (PROTONIX) 40 MG tablet Take 1 tablet PO qAM 30 min prior to breakfast  . PROVENTIL HFA 108 (90 Base) MCG/ACT inhaler INHALE 2 PUFFS BY MOUTH EVERY 4 HOURS   No current facility-administered medications on file prior to visit.     No Known Allergies  Review Of Systems:  Constitutional:   No  weight loss, night sweats,  Fevers, chills, fatigue, or  lassitude.  HEENT:   No headaches,  Difficulty swallowing,  Tooth/dental problems, or  Sore throat,                No sneezing, itching, ear ache, nasal congestion, post nasal drip,   CV:  No chest pain,  Orthopnea, PND, swelling in lower extremities, anasarca, dizziness, palpitations, syncope.   GI  No heartburn, indigestion, abdominal pain, nausea, vomiting, diarrhea, change in bowel habits, loss of appetite, bloody stools.   Resp: No shortness of breath with exertion or at rest.  No excess mucus, no productive cough,  No non-productive cough,  No coughing up of blood.  No change in color of mucus.  No wheezing.  No chest wall deformity  Skin: no rash or lesions.  GU: no dysuria, change  in color of urine, no urgency or frequency.  No flank pain, no hematuria   MS:  No joint pain or swelling.  No decreased range of motion.  No back pain.  Psych:  No change in mood or affect. No depression or anxiety.  No memory loss.   Vital Signs BP 130/80 (BP Location: Left Arm, Cuff Size: Normal)   Pulse (!) 110   Temp 97.6 F (36.4 C) (Oral)   Ht 5\' 8"  (1.727 m)   Wt 230 lb (104.3 kg)   SpO2 (!) 89%   BMI 34.97 kg/m  Physical Exam:  General- No distress,  A&Ox3, pleasant ENT: No sinus tenderness, TM clear, pale nasal mucosa, no oral exudate,no post nasal drip, no LAN Cardiac: S1, S2, regular rate and rhythm, no murmur Chest: No wheeze/ rales/ dullness; no accessory muscle use, no nasal flaring, no sternal retractions, prolonged expiration Abd.: Soft Non-tender Ext: No clubbing cyanosis, edema Neuro:  normal strength, MAE x 4, A&O x 3 Skin: No rashes, warm and dry, no lesions Psych: normal mood and behavior   Assessment/Plan  COPD Gold IV Needs qualifying walk for POC oxygen Plan We have walked you on the POC device today  to qualify you for the portable oxygen tank.  You do Qualify for POC, as the oxygen set a 5 liters maintains your sats at > 88%.  We will place an order for a POC through your DME. Continue wearing oxygen at 2 L at rest and 3 L with exertion. Wear the portable tank at 5 L with exertion, as this is pulsed oxygen, so you need a higher flow..   Continue using albuterol as needed for shortness of breath or wheezing. Continue Bevespi as you have been doing Rinse mouth after use.  Note your daily symptoms > remember "red flags" for COPD:  Increase in cough, increase in sputum production, increase in shortness of breath or activity intolerance. If you notice these symptoms, please call to be seen.   Follow up with Dr. [name] in November 2021 at [facility] office  Please contact office for sooner follow up if symptoms do not improve or worsen or seek  emergency care   Magdalen Spatz, NP 09/25/2019  3:20 PM

## 2019-10-01 DIAGNOSIS — J449 Chronic obstructive pulmonary disease, unspecified: Secondary | ICD-10-CM | POA: Diagnosis not present

## 2019-10-19 DIAGNOSIS — J449 Chronic obstructive pulmonary disease, unspecified: Secondary | ICD-10-CM | POA: Diagnosis not present

## 2019-10-31 DIAGNOSIS — J449 Chronic obstructive pulmonary disease, unspecified: Secondary | ICD-10-CM | POA: Diagnosis not present

## 2019-11-07 IMAGING — DX DG CHEST 2V
2 series · 2 of 2 positions shown · non-contrast
Comparison: 01/01/2016.

CLINICAL DATA: History of COPD.

EXAM:
CHEST  2 VIEW

[chest pa]
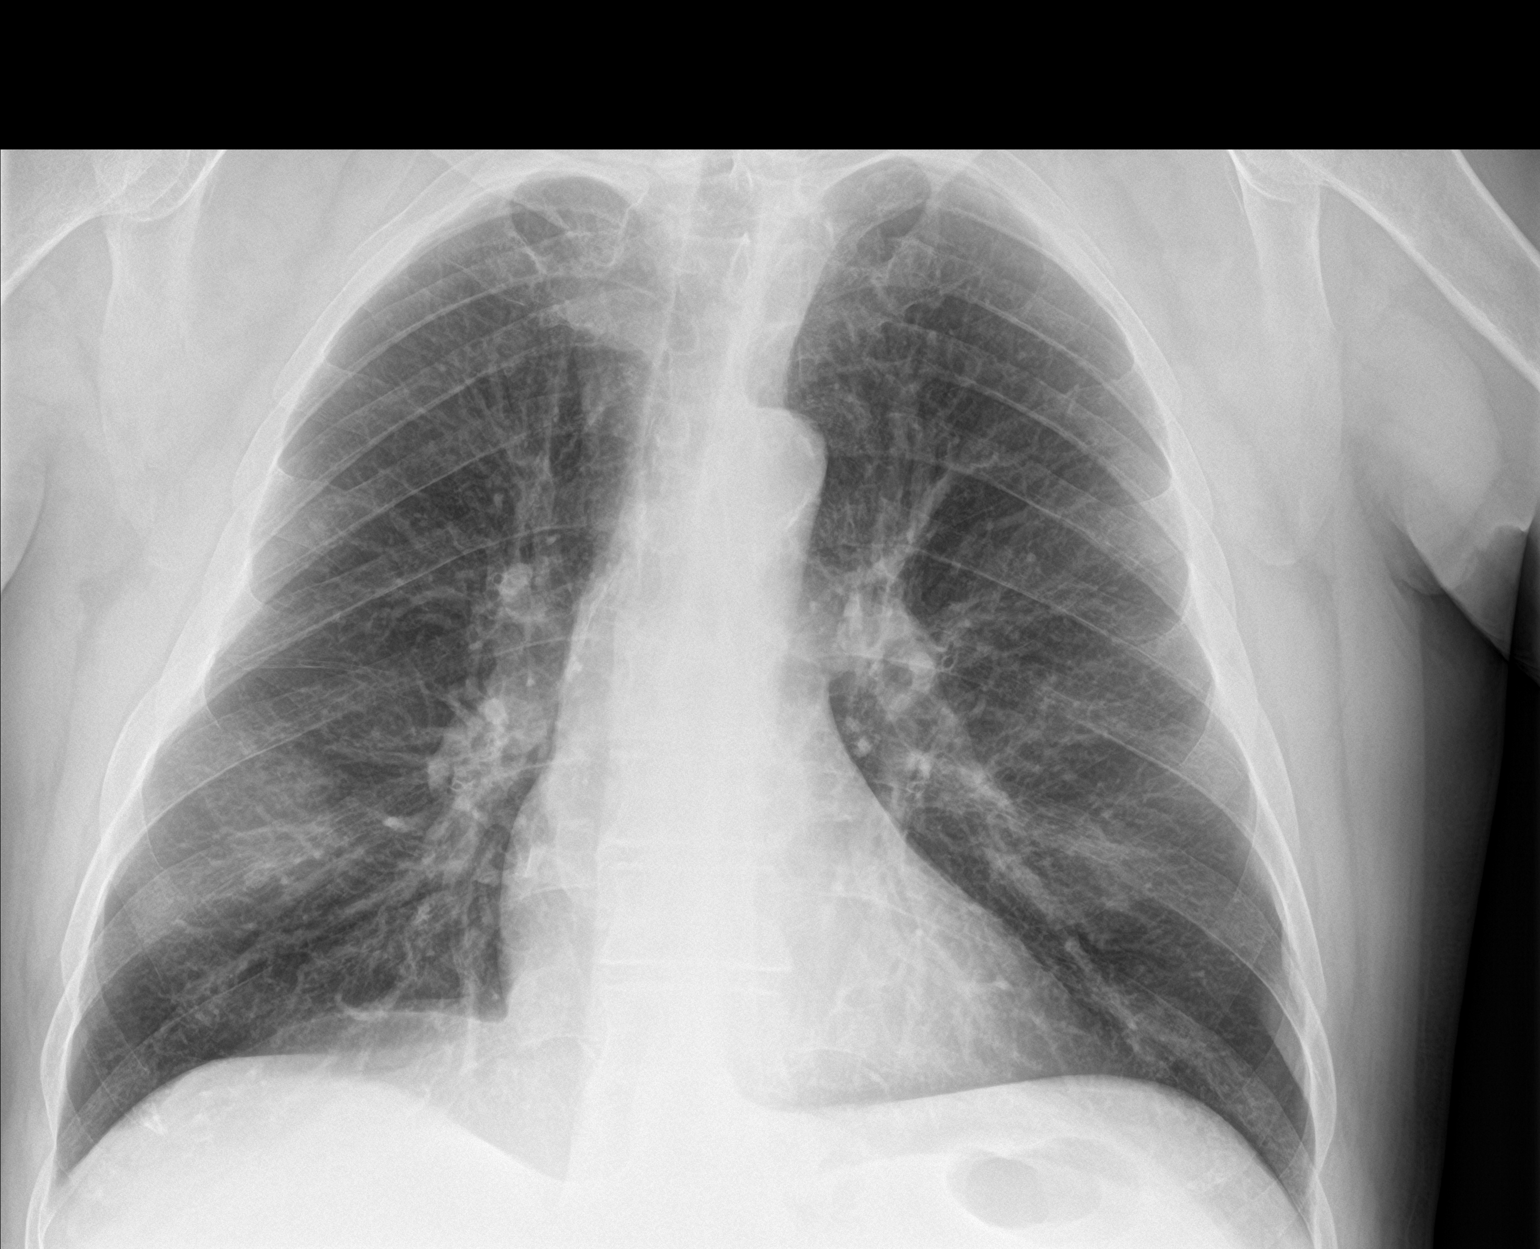

[chest lat]
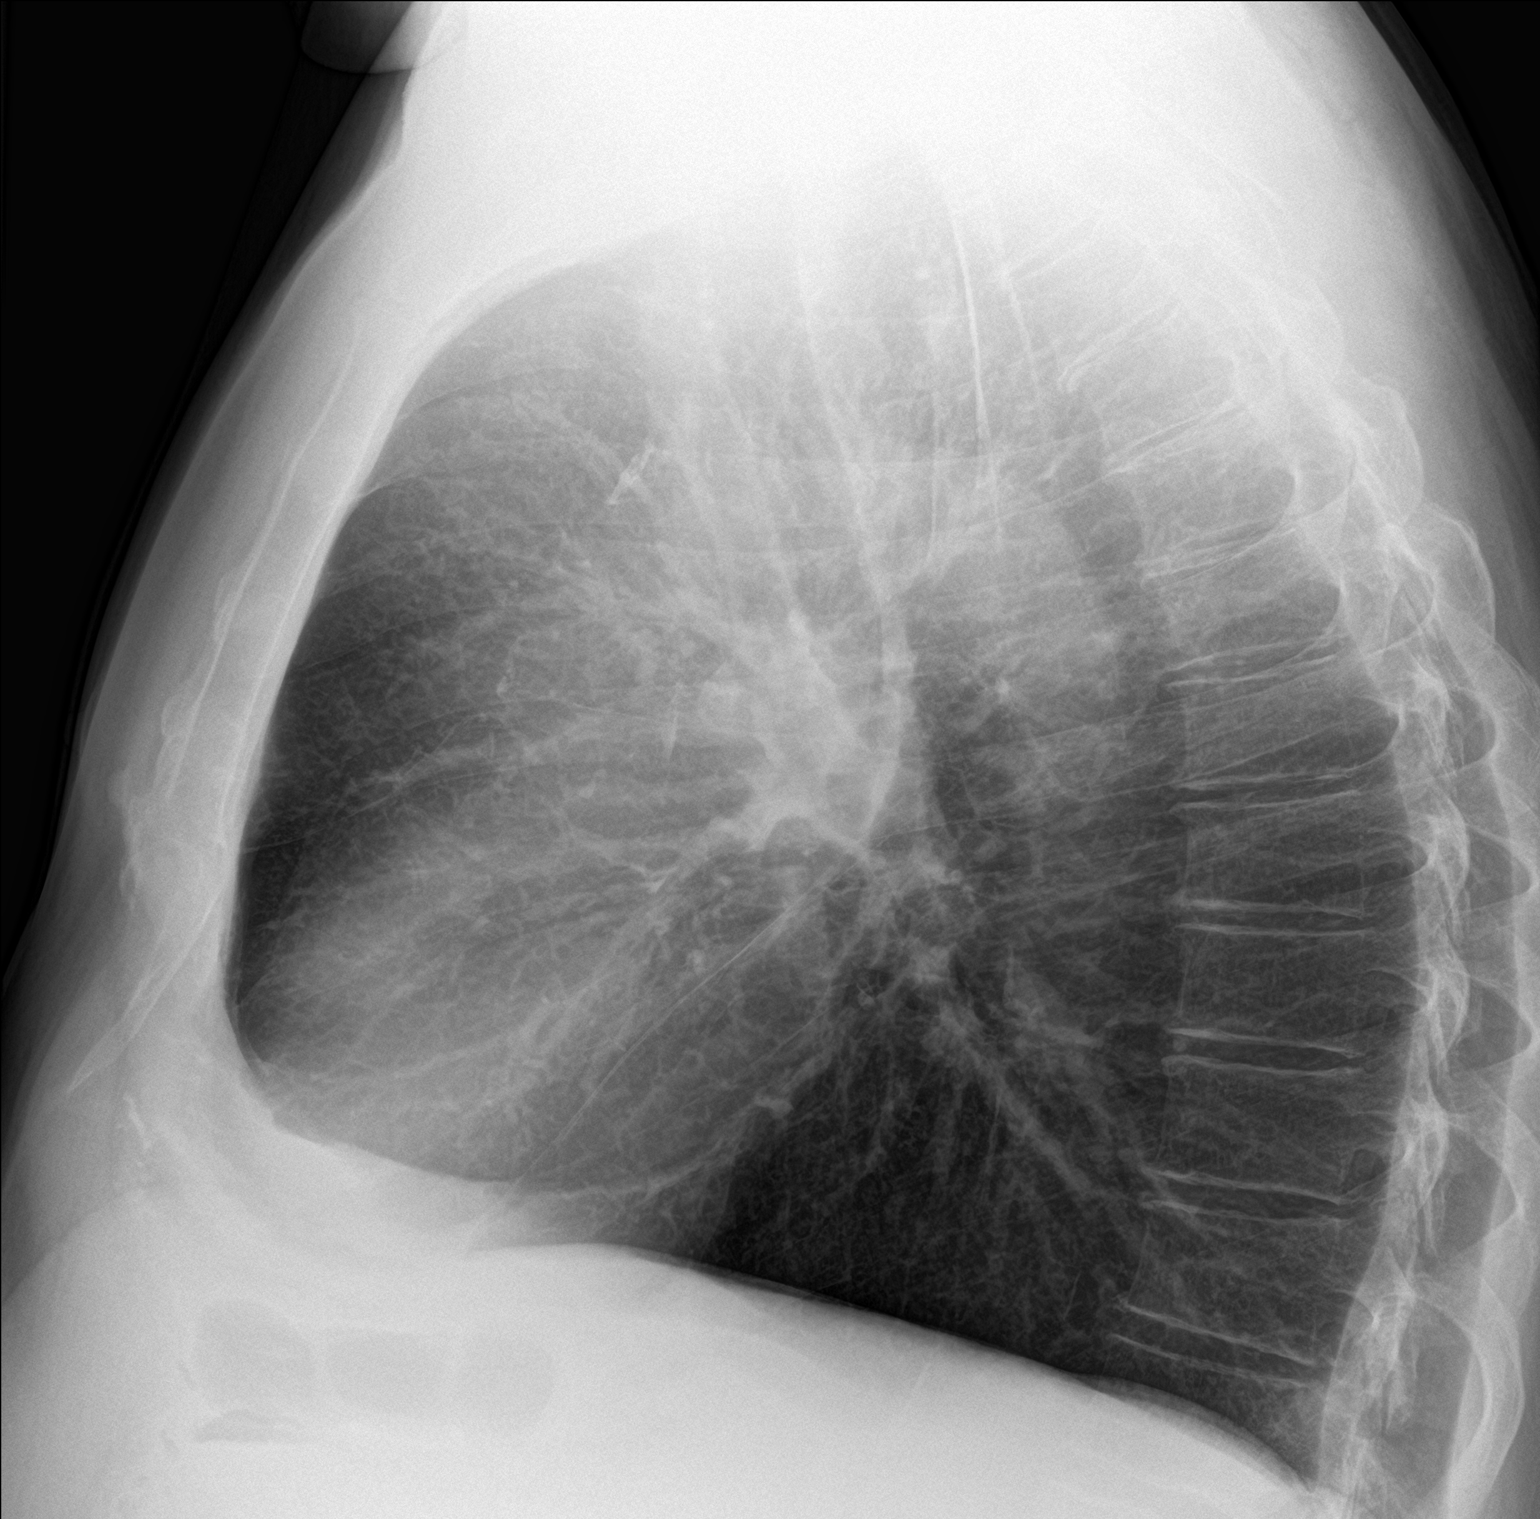

[2 of 2 positions shown; findings below may reference images not displayed]

FINDINGS: Mediastinum and hilar structures normal. Mild infiltrate right mid
lung field. Infiltrates slightly more prominent than on prior study.
No pleural effusion or pneumothorax. Heart size normal. No acute
bony abnormality no pleural effusion or pneumothorax. Heart size
normal. No acute bony abnormality.
IMPRESSION: Mild infiltrate right mid lung field. Infiltrate is slightly more
prominent than on prior study of 01/01/2016. This could represent
recurrent or progressive infiltrate. Follow-up PA and lateral chest
x-ray to demonstrate clearing suggested.

## 2019-11-19 DIAGNOSIS — J449 Chronic obstructive pulmonary disease, unspecified: Secondary | ICD-10-CM | POA: Diagnosis not present

## 2019-11-25 ENCOUNTER — Ambulatory Visit: Payer: Medicare Other | Admitting: Internal Medicine

## 2019-11-25 NOTE — Progress Notes (Deleted)
Subjective:    Patient ID: Randall Sawyer, male    DOB: [birth year]     MRN: [id number]    Brief patient profile:  47 yowm quit smoking 05/2017  first noticed doe x around 2005 first on just albuterol then added spiriva then symbicort then stiolto which works the best so far and referred to pulmonary clinic 05/27/2014 by Dr Dennard Schaumann for copd eval and proved to have a GOLD IV severity  07/23/14 with hypercarbia.   History of Present Illness  05/27/2014 1st [facility] office visit/ Randall Sawyer   Chief Complaint  Patient presents with  . Pulmonary Consult    referred by Dr. Dennard Schaumann SOB x 1 year. C/o of SOB, fluid retention, prod cough with yellow thick mucus.  Wheezing late in the evenings. Left sided abdominal pain.   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                                      spiriva 2 puffs each am  Plan B = Back up - Only use your albuterol as a rescue medication  Plan C = crisis - Your albuterol nebulizer can be used up to every  4 hours with the goal of not needing at all eventually  Prednisone 10 mg take  4 each am x 2 days,   2 each am x 2 days,  1 each am x 2 days and stop      06/11/2014 f/u ov/Randall Sawyer re: copd still smoking   Chief Complaint  Patient presents with  . Follow-up    fluid retention not as bad; cough; chest tightness;states here for paperwork;  last used ventolin 1 hours - has not tried rechallenge/ sleeps ok  Just walking to mb makes him  stop one half way going both ways though uphill to it.  Not needing neb as much  No noct or early am exac  Has not worked since 05/27/14 rec Plan A =  Automatic =  Symbicort 160 Take 2 puffs first thing in am and then another 2 puffs about 12 hours later.                                      spiriva 2 puffs each am     Plan B = Back up - Only use your albuterol as a rescue medication Plan C = crisis - Your albuterol nebulizer can be used up to every  4 hours with the goal of not needing at all eventually  The key is to stop smoking completely before smoking completely stops you!    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 Follow-up    Breathing is unchanged since the last visit. He has good days and bad days. He uses his albuterol inhaler once daily on average. He rarely uses neb.   Dyspnea:  [employer] shopping on 3lpm  Cough: min mucoid  Sleeping: 1lpm / bed / 3 pillows  SABA use: every evening p supper  02: 1lpm at rest, 3lpm with activity   rec No change rx   05/28/2018  f/u ov/Randall Sawyer re:  GOLD IV/ still off 02 and maintained on Bevespi and 02 as below Chief Complaint  Patient presents with  . Follow-up    Breathing is unchanged and no new co's. He is using his rescue inhaler once per wk on average. He rarely uses neb.    Dyspnea:  Not doing much walking since virus Cough: none Sleeping: on side bed is flat/two pillows SABA use: rarely needs hfa and never neb  02: 2lpm hs none sitting with sats 92 and up to 3lpm when walking at 95%  rec No change in recommendations but you need to walk more  Please schedule a follow up visit in 6 months but call sooner if needed  - discuss low dose screening program p covid restrictions lifted    12/03/2018  f/u ov/Randall Sawyer re:  GOLD  IV still off cigs, still crave them / maintain on bevespi   Chief Complaint  Patient presents with  . Follow-up    Breathing is overall doing well. He is using his albuterol inhaler about 2 x per wk and has not used neb recently.   Dyspnea:  About a quarter mile stops half way  Cough: little clear, occ am flares  Sleeping: on side/ bed is flat two pillows  SABA use: no neb need  02: at rest off 02 low 90s/ 3lpm sleeping,  1-2 lpm slow pace, not measruing sats  rec Make sure you check your oxygen saturations at highest level of activity to be sure it stays over 90%  No change in medications     07/03/2019  f/u ov/Randall Sawyer re:  GOLD IV / no vaccination / bevespi Chief Complaint  Patient presents with  . Follow-up   Dyspnea:  Able walk x one quarter mile on 3lpm with sats low 90s Cough: min Sleeping: bed flat/ 2 pillows  SABA use: hfa only maybe up to twice daily / never noct/ never  02: 3lpm 24/7  rec No change in medications I strongly recommend the covid vaccine at [facility] based on safety and effectiveness       11/25/2019  f/u ov/[place] office/Randall Sawyer re:  No chief complaint on file.    Dyspnea:  *** Cough: *** Sleeping: *** SABA use: *** 02: ***   No obvious day to day or daytime variability or assoc excess/ purulent sputum or mucus plugs or hemoptysis or cp or chest tightness, subjective wheeze or overt sinus or hb symptoms.   *** without nocturnal  or early am exacerbation  of respiratory  c/o's or need for noct saba. Also denies any obvious fluctuation of symptoms with weather or environmental changes or other aggravating or alleviating factors except as outlined above   No unusual exposure hx or h/o childhood pna/ asthma or knowledge of premature birth.  Current Allergies, Complete Past Medical History, Past Surgical History, Family History, and  Social History were reviewed in [employer] record.  ROS  The following are not active complaints unless bolded Hoarseness, sore throat, dysphagia,  dental problems, itching, sneezing,  nasal congestion or discharge of excess mucus or purulent secretions, ear ache,   fever, chills, sweats, unintended wt loss or wt gain, classically pleuritic or exertional cp,  orthopnea pnd or arm/hand swelling  or leg swelling, presyncope, palpitations, abdominal pain, anorexia, nausea, vomiting, diarrhea  or change in bowel habits or change in bladder habits, change in stools or change in urine, dysuria, hematuria,  rash, arthralgias, visual complaints, headache, numbness, weakness or ataxia or problems with walking or coordination,  change in mood or  memory.        No outpatient medications have been marked as taking for the 11/25/19 encounter (Appointment) with Tanda Rockers, MD.                    Objective:   Physical Exam  11/25/2019     *** 07/03/2019       227  12/03/2018     231  06/11/2014           179 >  07/23/2014   184 > 09/03/2014 185 > 12/01/2014  196 > 03/03/2015  190 > 08/31/2015     192 > 09/28/2015  195  > 11/09/2015   186  > 02/09/2016     189> 11/15/2016  198 >  03/02/2017  204 > 07/04/2017  201> 10/04/2017 213  > 05/28/2018   225     05/27/14 180 lb (81.647 kg)  05/16/14 176 lb (79.833 kg)  05/09/14 180 lb (81.647 kg)    Vital signs reviewed  11/25/2019  - Note at rest 02 sats  ***% on ***      Report full dentures       Mild barrel  contour c    1+ pitting edema both LE's R > L  (baseline)

## 2019-12-01 DIAGNOSIS — J449 Chronic obstructive pulmonary disease, unspecified: Secondary | ICD-10-CM | POA: Diagnosis not present

## 2019-12-09 IMAGING — CT CT BIOPSY
1 of 2 series · 13 of 32 positions shown, 19 images · non-contrast
Comparison: none

INDICATION: Right anterior sixth lytic expansile rib lesion which is PET
positive

[Series 2: i-spiral 5.0 b40f · axial · 0.68mm/px · z∈[+1329,+1497]mm · 13 of 58 slices shown, 19 images]
[im 5/58  soft-tissue]
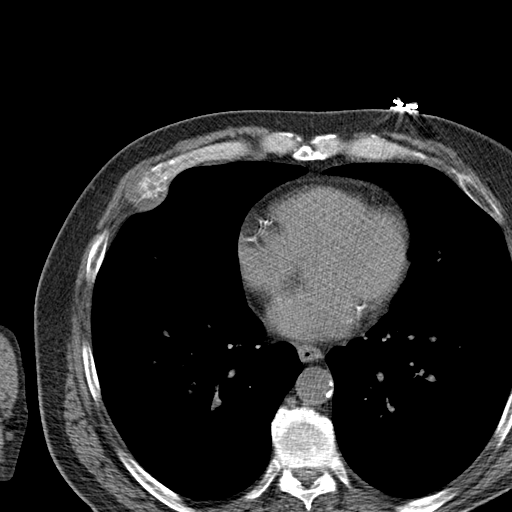
[im 5/58  bone]
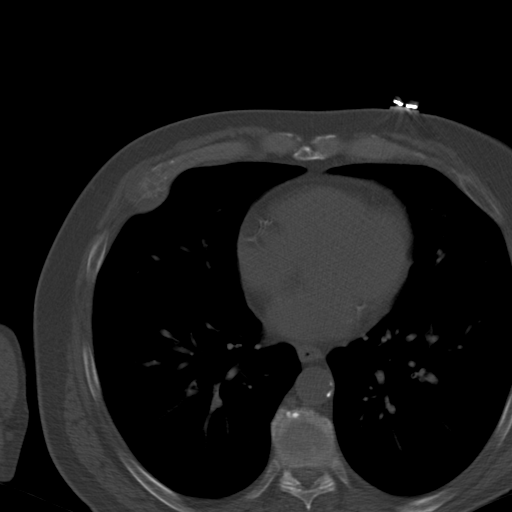
[im 9/58  soft-tissue]
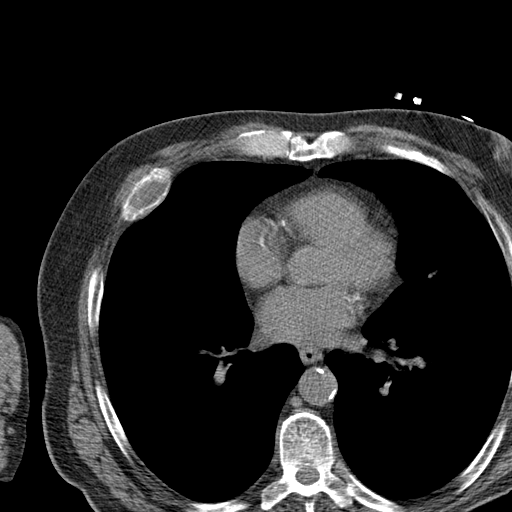
[im 13/58  soft-tissue]
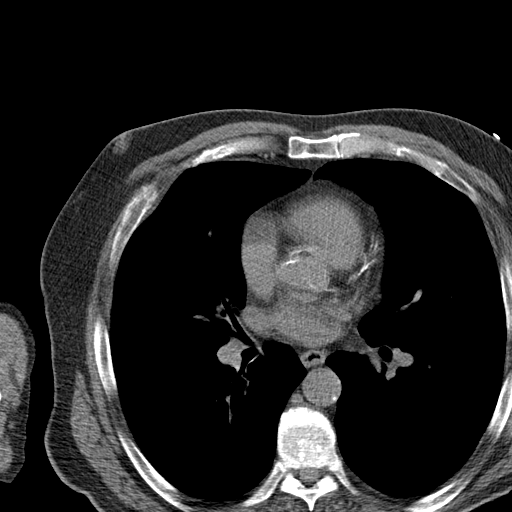
[im 17/58  soft-tissue]
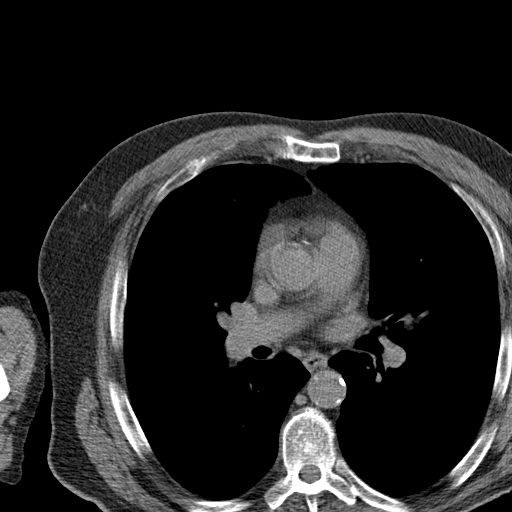
[im 21/58  soft-tissue]
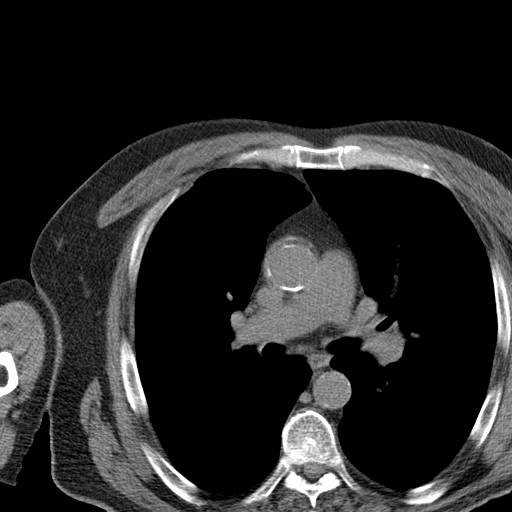
[im 25/58  soft-tissue]
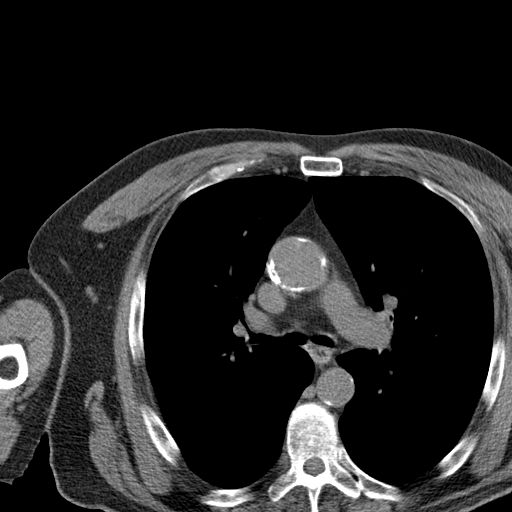
[im 29/58  soft-tissue]
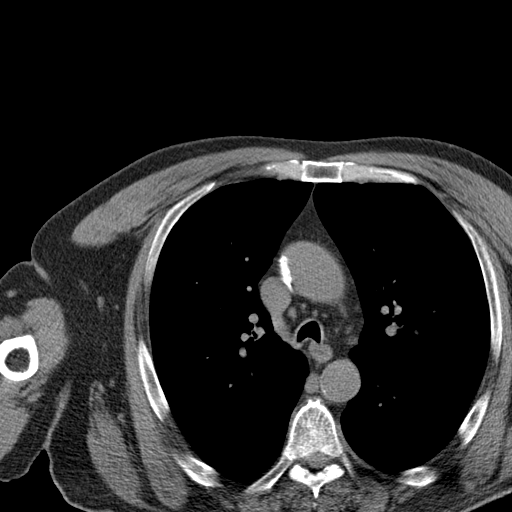
[im 33/58  soft-tissue]
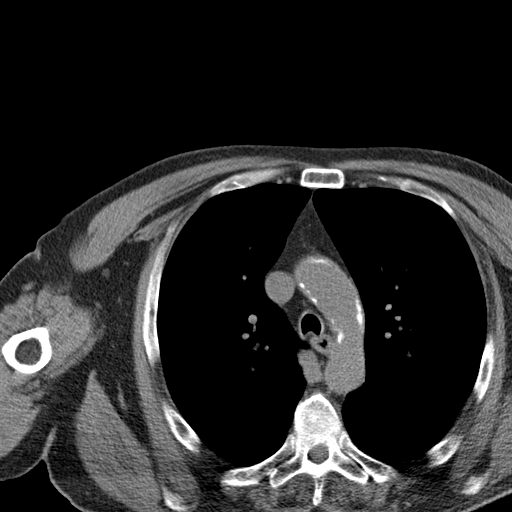
[im 37/58  soft-tissue]
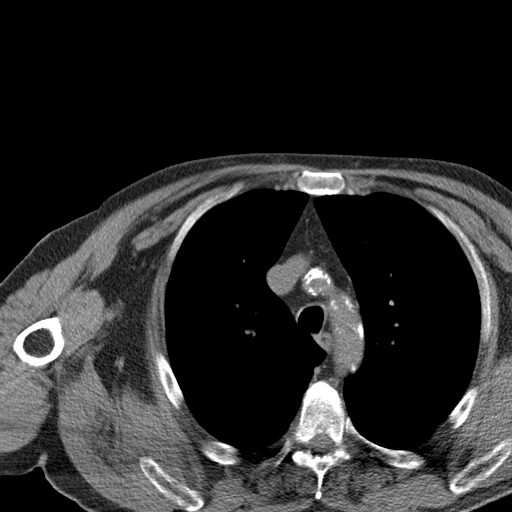
[im 37/58  bone]
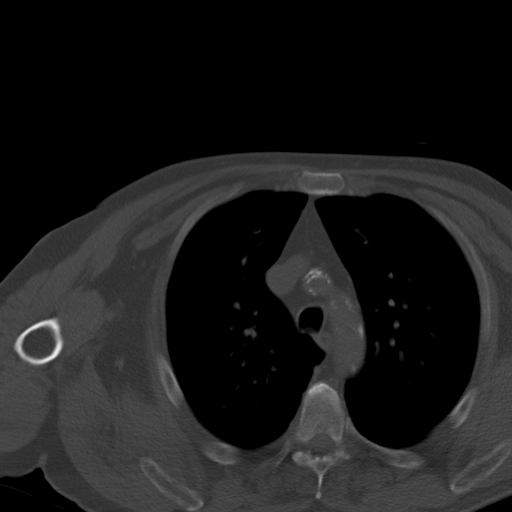
[im 41/58  soft-tissue]
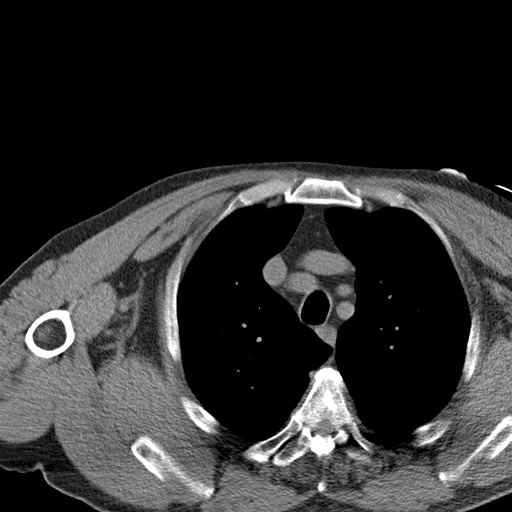
[im 41/58  lung]
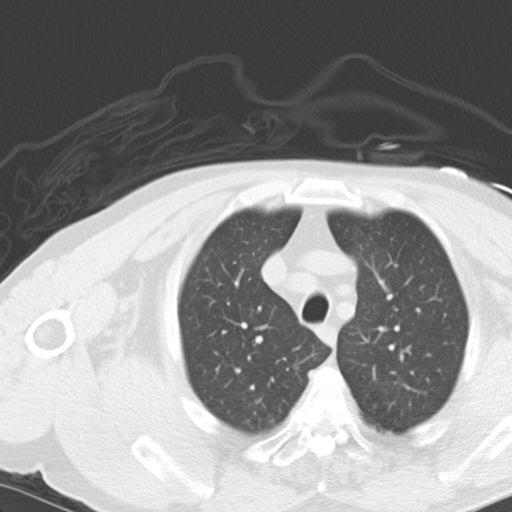
[im 45/58  soft-tissue]
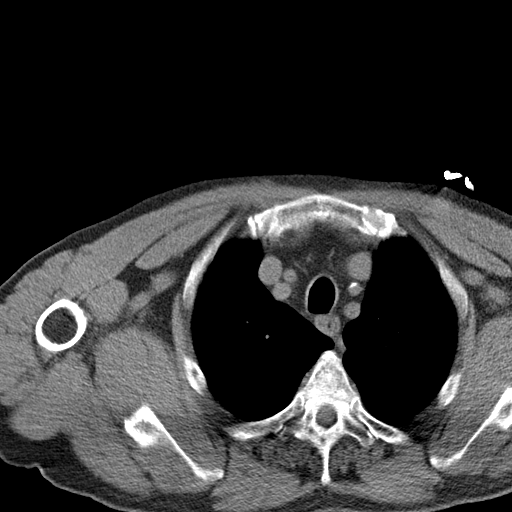
[im 45/58  lung]
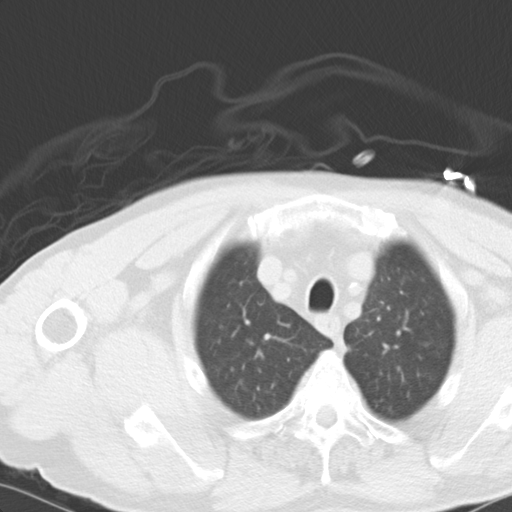
[im 49/58  soft-tissue]
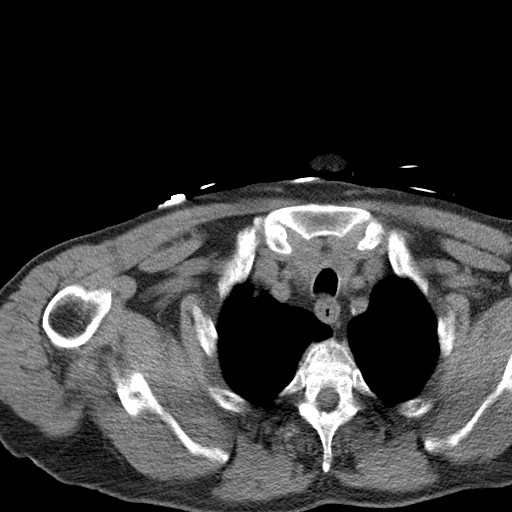
[im 49/58  lung]
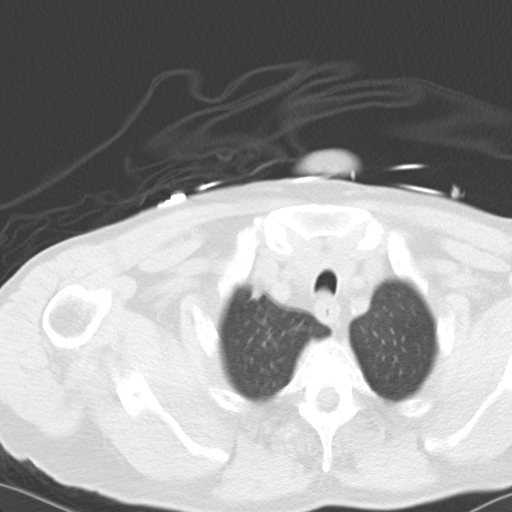
[im 53/58  soft-tissue]
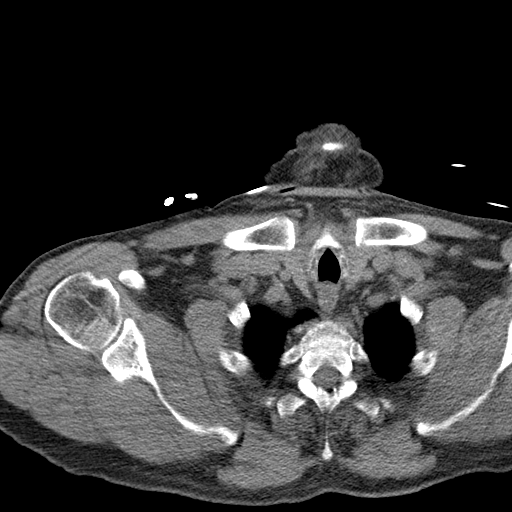
[im 53/58  lung]
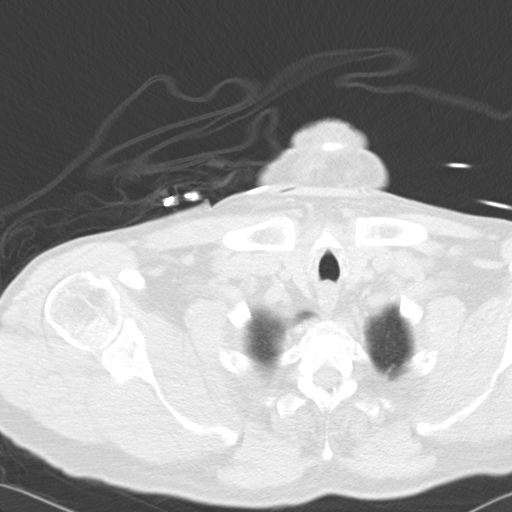

[13 of 32 positions shown; findings below may reference images not displayed]

EXAM:
CT-GUIDED BIOPSY OF THE RIGHT ANTERIOR SIXTH RIB LESION

MEDICATIONS:
1% LIDOCAINE LOCAL

ANESTHESIA/SEDATION:
0.5 mg IV Versed; 100 mcg IV Fentanyl

Moderate Sedation Time:  19 MINUTES

The patient was continuously monitored during the procedure by the
interventional radiology nurse under my direct supervision.

PROCEDURE:
The procedure, risks, benefits, and alternatives were explained to
the patient. Questions regarding the procedure were encouraged and
answered. The patient understands and consents to the procedure.

Previous imaging reviewed. Patient positioned supine. Noncontrast
localization CT performed. The right anterior sixth rib expansile
lytic lesion was localized. Overlying skin marked.

Under sterile conditions and local anesthesia, an 11 gauge bone
biopsy needle was advanced to the lesion along the longitudinal
access directed away from the lung. Several passes made with the 11
gauge core needle for biopsy of the lesion. Samples placed in
formalin. Postprocedure imaging demonstrates no hemorrhage or
hematoma.

Patient tolerated the procedure well without complication. Vital
sign monitoring by nursing staff during the procedure will continue
as patient is in the special procedures unit for post procedure
observation.
FINDINGS: The images document guide needle placement within the right anterior
sixth rib lesion. Post biopsy images demonstrate no hemorrhage or
hematoma.

COMPLICATIONS:
None immediate.
IMPRESSION: Successful CT-guided core biopsy of the right anterior sixth rib
lesion

## 2019-12-11 ENCOUNTER — Encounter: Payer: Self-pay | Admitting: Internal Medicine

## 2019-12-11 ENCOUNTER — Ambulatory Visit: Payer: Medicare Other | Admitting: Internal Medicine

## 2019-12-11 ENCOUNTER — Other Ambulatory Visit: Payer: Self-pay

## 2019-12-11 DIAGNOSIS — J449 Chronic obstructive pulmonary disease, unspecified: Secondary | ICD-10-CM

## 2019-12-11 DIAGNOSIS — J9611 Chronic respiratory failure with hypoxia: Secondary | ICD-10-CM | POA: Diagnosis not present

## 2019-12-11 DIAGNOSIS — J9612 Chronic respiratory failure with hypercapnia: Secondary | ICD-10-CM

## 2019-12-11 NOTE — Patient Instructions (Addendum)
We will walk you today to let you know how much oxygen you need walking vs sitting.   Make sure you check your oxygen saturations at highest level of activity to be sure it stays over 90% and adjust  02 flow upward to maintain this level if needed but remember to turn it back to previous settings when you stop (to conserve your supply).   At your convenience within the next 6 months>>> Please remember to go to the lab department @ [facility] for your tests - we will call you with the results when they are available.       Please schedule a follow up visit in 6  months but call sooner if needed

## 2019-12-11 NOTE — Progress Notes (Signed)
Subjective:    Patient ID: Randall Sawyer, male    DOB: 06/23/1955     MRN: [id number]    Brief patient profile:  25  yowm quit smoking 05/2017  first noticed doe x around 2005 first on just albuterol then added spiriva then symbicort then stiolto which works the best so far and referred to pulmonary clinic 05/27/2014 by Dr Dennard Schaumann for copd eval and proved to have a GOLD IV severity  07/23/14 with hypercarbia.   History of Present Illness  05/27/2014 1st [facility] office visit/ Santia Labate   Chief Complaint  Patient presents with  . Pulmonary Consult    referred by Dr. Dennard Schaumann SOB x 1 year. C/o of SOB, fluid retention, prod cough with yellow thick mucus.  Wheezing late in the evenings. Left sided abdominal pain.   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                                      spiriva 2 puffs each am  Plan B = Back up - Only use your albuterol as a rescue medication  Plan C = crisis - Your albuterol nebulizer can be used up to every  4 hours with the goal of not needing at all eventually  Prednisone 10 mg take  4 each am x 2 days,   2 each am x 2 days,  1 each am x 2 days and stop      06/11/2014 f/u ov/Joretta Eads re: copd still smoking   Chief Complaint  Patient presents with  . Follow-up    fluid retention not as bad; cough; chest tightness;states here for paperwork;  last used ventolin 1 hours - has not tried rechallenge/ sleeps ok  Just walking to mb makes him  stop one half way going both ways though uphill to it.  Not needing neb as much  No noct or early am exac  Has not worked since 05/27/14 rec Plan A =  Automatic =  Symbicort 160 Take 2 puffs first thing in am and then another 2 puffs about 12 hours later.                                      spiriva 2 puffs each am     Plan B = Back up - Only use your albuterol as a rescue medication Plan C = crisis - Your albuterol nebulizer can be used up to every  4 hours with the goal of not needing at all eventually  The key is to stop smoking completely before smoking completely stops you!    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 Follow-up    Breathing is unchanged since the last visit. He has good days and bad days. He uses his albuterol inhaler once daily on average. He rarely uses neb.   Dyspnea:  [employer] shopping on 3lpm  Cough: min mucoid  Sleeping: 1lpm / bed / 3 pillows  SABA use: every evening p supper  02: 1lpm at rest, 3lpm with activity   rec No change rx   05/28/2018  f/u ov/Raeshaun Simson re:  GOLD IV/ still off 02 and maintained on Bevespi and 02 as below Chief Complaint  Patient presents with  . Follow-up    Breathing is unchanged and no new co's. He is using his rescue inhaler once per wk on average. He rarely uses neb.    Dyspnea:  Not doing much walking since virus Cough: none Sleeping: on side bed is flat/two pillows SABA use: rarely needs hfa and never neb  02: 2lpm hs none sitting with sats 92 and up to 3lpm when walking at 95%  rec No change in recommendations but you need to walk more  Please schedule a follow up visit in 6 months but call sooner if needed  - discuss low dose screening program p covid restrictions lifted    12/03/2018  f/u ov/Marquavious Nazar re:  GOLD  IV still off cigs, still crave them / maintain on bevespi   Chief Complaint  Patient presents with  . Follow-up    Breathing is overall doing well. He is using his albuterol inhaler about 2 x per wk and has not used neb recently.   Dyspnea:  About a quarter mile stops half way  Cough: little clear, occ am flares  Sleeping: on side/ bed is flat two pillows  SABA use: no neb need  02: at rest off 02 low 90s/ 3lpm sleeping,  1-2 lpm slow pace, not measruing sats  rec Make sure you check your oxygen saturations at highest level of activity to be sure it stays over 90%  No change in medications     07/03/2019  f/u ov/Nga Rabon re:  GOLD IV / no vaccination / bevespi Chief Complaint  Patient presents with  . Follow-up  Dyspnea:  Able walk x one quarter mile on 3lpm with sats low 90s Cough: min Sleeping: bed flat/ 2 pillows  SABA use: hfa only maybe up to twice daily / never noct/ never  02: 3lpm 24/7  rec No change in medications I strongly recommend the covid vaccine at [facility] based on safety and effectiveness    12/11/2019  f/u ov/[facility] office/Timi Reeser re: bevespi  Chief Complaint  Patient presents with  . Follow-up    Breathing is doing well today. He has good days and bad days. He states that he has noticed panic attacks over the past 6 months relates to his breathing.   Dyspnea:  Walking at [employer] about the same  Cough: minimal am congestion/ mucoid Sleeping: bed is flat / lots of pillows = baseline SABA use: once or twice weekly  - neb every 3-4 hours  02: 3lpm hs /  At rest 0,  Walk 2lpm  Does not check sats    No obvious day to day or daytime variability or assoc  purulent sputum or mucus plugs or hemoptysis or cp or chest tightness, subjective wheeze or overt  sinus or hb symptoms.   Sleeping  without nocturnal  or early am exacerbation  of respiratory  c/o's or need for noct saba. Also denies any obvious fluctuation of symptoms with weather or environmental changes or other aggravating or alleviating factors except as outlined  above   No unusual exposure hx or h/o childhood pna/ asthma or knowledge of premature birth.  Current Allergies, Complete Past Medical History, Past Surgical History, Family History, and Social History were reviewed in [employer] record.  ROS  The following are not active complaints unless bolded Hoarseness, sore throat, dysphagia, dental problems, itching, sneezing,  nasal congestion or discharge of excess mucus or purulent secretions, ear ache,   fever, chills, sweats, unintended wt loss or wt gain, classically pleuritic or exertional cp,  orthopnea pnd or arm/hand swelling  or leg swelling, presyncope, palpitations, abdominal pain, anorexia, nausea, vomiting, diarrhea  or change in bowel habits or change in bladder habits, change in stools or change in urine, dysuria, hematuria,  rash, arthralgias, visual complaints, headache, numbness, weakness or ataxia or problems with walking or coordination,  change in mood or  memory.        Current Meds  Medication Sig  . furosemide (LASIX) 40 MG tablet TAKE 1 AND 1/2 TABLETS BY MOUTH DAILY  . Glycopyrrolate-Formoterol (BEVESPI AEROSPHERE) 9-4.8 MCG/ACT AERO Inhale 2 puffs into the lungs 2 (two) times daily.  Marland Kitchen ipratropium-albuterol (DUONEB) 0.5-2.5 (3) MG/3ML SOLN Take 3 mLs by nebulization every 6 (six) hours as needed.  . OXYGEN Inhale 3 L into the lungs at bedtime. And 3 lpm with exertion  . pantoprazole (PROTONIX) 40 MG tablet Take 1 tablet PO qAM 30 min prior to breakfast  . PROVENTIL HFA 108 (90 Base) MCG/ACT inhaler INHALE 2 PUFFS BY MOUTH EVERY 4 HOURS             Objective:   Physical Exam  12/11/2019        234 07/03/2019       227  12/03/2018     231  06/11/2014           179 >  07/23/2014   184 > 09/03/2014 185 > 12/01/2014  196 > 03/03/2015  190 > 08/31/2015     192 > 09/28/2015  195  > 11/09/2015   186  > 02/09/2016     189> 11/15/2016  198 >  03/02/2017  204 > 07/04/2017  201> 10/04/2017 213  > 05/28/2018   225      05/27/14 180 lb (81.647 kg)  05/16/14 176 lb (79.833 kg)  05/09/14 180 lb (81.647 kg)    Vital signs reviewed  11/25/2019  - Note at rest 02 sats  87% on RA      Report full dentures     HEENT : pt wearing mask not removed for exam due to covid - 19 concerns.    NECK :  without JVD/Nodes/TM/ nl carotid upstrokes bilaterally   LUNGS: no acc muscle use,  Mild barrel  contour chest wall with bilateral  Distant bs s audible wheeze and  without cough on insp or exp maneuvers  and mild  Hyperresonant  to  percussion bilaterally     CV:  RRR  no s3 or murmur or increase in P2, and 1-2 + ptting R, 1+ L lower ext  ABD:  soft and nontender with pos end  insp Hoover's  in the supine position. No bruits or organomegaly appreciated, bowel sounds nl  MS:  Nl gait/  ext warm without deformities, calf tenderness, cyanosis or clubbing No obvious joint restrictions   SKIN: warm and dry without lesions    NEURO:  alert, approp, nl sensorium with  no motor or cerebellar deficits apparent.

## 2019-12-12 ENCOUNTER — Encounter: Payer: Self-pay | Admitting: Internal Medicine

## 2019-12-12 NOTE — Assessment & Plan Note (Addendum)
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/21/17 at resection of R rib chondrosarcoma - 05/28/2018  After extensive coaching inhaler device,  effectiveness =    90% so continue bevespi 2 bid   Labs ordered 12/11/2019  :   alpha one AT phenotype        Pt is Group B in terms of symptom/risk and laba/lama therefore appropriate rx at this point >>>  Continue bevespi and prn saba  I spent extra time with pt today reviewing appropriate use of albuterol for prn use on exertion with the following points: 1) saba is for relief of sob that does not improve by walking a slower pace or resting but rather if the pt does not improve after trying this first. 2) If the pt is convinced, as many are, that saba helps recover from activity faster then it's easy to tell if this is the case by re-challenging : ie stop, take the inhaler, then p 5 minutes try the exact same activity (intensity of workload) that just caused the symptoms and see if they are substantially diminished or not after saba 3) if there is an activity that reproducibly causes the symptoms, try the saba 15 min before the activity on alternate days   If in fact the saba really does help, then fine to continue to use it prn but advised may need to look closer at the maintenance regimen being used to achieve better control of airways disease with exertion.

## 2019-12-12 NOTE — Assessment & Plan Note (Signed)
HC03  36  05/16/14 corresponding to a pc02 above 55  - ono RA < 89% x 4 h 11 min > 09/12/2014  rec one liter per min hs and recheck = > done 9/141/6 with < 89% x 3m > no change rx  - HC03  11/09/2015  = 39  - HCO3 02/09/2016    = 40  - HC03  11/15/2016    = 36  - 07/04/2017  Walked RA x 3 laps @ 185 ft each stopped due to  desat to 85% then completed 3rd lap nl pace on 3lpm POC pulsed  - 12/03/2018   Walked 2lpmx two laps =  approx 570ft @ moderate pace - stopped due to end of study with sats of 91% at the end of the study but sob so rec 3lpm at highest level of activity with goal > 90% and help burn fat to get into neg cal balance  -  12/11/2019   Walked RA  approx   200 ft  @ avg pace  stopped due to  Sob/tired with sats still 92% but on RA only 87% at rest vs 90% on 0.5 lpm   Reviewed goal of > 90% at all times so if he takes it off at rest daytime he should monitor his sats as RA may not be adequate for him  Also again advised: Make sure you check your oxygen saturations at highest level of activity to be sure it stays over 90% and adjust  02 flow upward to maintain this level if needed but remember to turn it back to previous settings when you stop (to conserve your supply).           Each maintenance medication was reviewed in detail including emphasizing most importantly the difference between maintenance and prns and under what circumstances the prns are to be triggered using an action plan format where appropriate.  Total time for H and P, chart review, counseling,  directly observing portions of ambulatory 02 saturation study/  and generating customized AVS unique to this office visit / charting  > 30 min

## 2019-12-19 DIAGNOSIS — J449 Chronic obstructive pulmonary disease, unspecified: Secondary | ICD-10-CM | POA: Diagnosis not present

## 2019-12-29 ENCOUNTER — Encounter (HOSPITAL_COMMUNITY): Payer: Self-pay | Admitting: Emergency Medicine

## 2019-12-29 ENCOUNTER — Observation Stay (HOSPITAL_COMMUNITY)
Admission: EM | Admit: 2019-12-29 | Discharge: 2019-12-29 | Disposition: A | Discharge status: Home / Self Care | Payer: Medicare Other | Source: Home / Self Care | Attending: Emergency Medicine | Admitting: Emergency Medicine

## 2019-12-29 ENCOUNTER — Inpatient Hospital Stay (HOSPITAL_COMMUNITY)
Admission: EM | Admit: 2019-12-29 | Discharge: 2020-01-02 | DRG: 190 | Disposition: A | Discharge status: Home / Self Care | Payer: Medicare Other | Attending: Family Medicine | Admitting: Family Medicine

## 2019-12-29 ENCOUNTER — Emergency Department (HOSPITAL_COMMUNITY): Payer: Medicare Other

## 2019-12-29 ENCOUNTER — Other Ambulatory Visit: Payer: Self-pay

## 2019-12-29 DIAGNOSIS — Z6835 Body mass index (BMI) 35.0-35.9, adult: Secondary | ICD-10-CM

## 2019-12-29 DIAGNOSIS — I10 Essential (primary) hypertension: Secondary | ICD-10-CM | POA: Insufficient documentation

## 2019-12-29 DIAGNOSIS — N179 Acute kidney failure, unspecified: Secondary | ICD-10-CM | POA: Diagnosis not present

## 2019-12-29 DIAGNOSIS — Z9981 Dependence on supplemental oxygen: Secondary | ICD-10-CM

## 2019-12-29 DIAGNOSIS — Z79899 Other long term (current) drug therapy: Secondary | ICD-10-CM | POA: Insufficient documentation

## 2019-12-29 DIAGNOSIS — Z87891 Personal history of nicotine dependence: Secondary | ICD-10-CM

## 2019-12-29 DIAGNOSIS — E669 Obesity, unspecified: Secondary | ICD-10-CM | POA: Diagnosis present

## 2019-12-29 DIAGNOSIS — R600 Localized edema: Secondary | ICD-10-CM | POA: Diagnosis not present

## 2019-12-29 DIAGNOSIS — J449 Chronic obstructive pulmonary disease, unspecified: Secondary | ICD-10-CM | POA: Diagnosis present

## 2019-12-29 DIAGNOSIS — J9621 Acute and chronic respiratory failure with hypoxia: Secondary | ICD-10-CM | POA: Diagnosis present

## 2019-12-29 DIAGNOSIS — Z8249 Family history of ischemic heart disease and other diseases of the circulatory system: Secondary | ICD-10-CM

## 2019-12-29 DIAGNOSIS — Z7951 Long term (current) use of inhaled steroids: Secondary | ICD-10-CM | POA: Insufficient documentation

## 2019-12-29 DIAGNOSIS — E8770 Fluid overload, unspecified: Secondary | ICD-10-CM | POA: Diagnosis present

## 2019-12-29 DIAGNOSIS — Z20822 Contact with and (suspected) exposure to covid-19: Secondary | ICD-10-CM | POA: Diagnosis present

## 2019-12-29 DIAGNOSIS — R0602 Shortness of breath: Secondary | ICD-10-CM | POA: Insufficient documentation

## 2019-12-29 DIAGNOSIS — Z87442 Personal history of urinary calculi: Secondary | ICD-10-CM

## 2019-12-29 DIAGNOSIS — J189 Pneumonia, unspecified organism: Secondary | ICD-10-CM | POA: Diagnosis present

## 2019-12-29 DIAGNOSIS — J441 Chronic obstructive pulmonary disease with (acute) exacerbation: Secondary | ICD-10-CM

## 2019-12-29 DIAGNOSIS — E871 Hypo-osmolality and hyponatremia: Secondary | ICD-10-CM | POA: Diagnosis present

## 2019-12-29 DIAGNOSIS — Z85830 Personal history of malignant neoplasm of bone: Secondary | ICD-10-CM | POA: Diagnosis not present

## 2019-12-29 DIAGNOSIS — I509 Heart failure, unspecified: Secondary | ICD-10-CM | POA: Diagnosis not present

## 2019-12-29 DIAGNOSIS — C413 Malignant neoplasm of ribs, sternum and clavicle: Secondary | ICD-10-CM | POA: Diagnosis present

## 2019-12-29 DIAGNOSIS — R0600 Dyspnea, unspecified: Secondary | ICD-10-CM | POA: Diagnosis not present

## 2019-12-29 LAB — CBC
HCT: 31.5 % — ABNORMAL LOW (ref 39.0–52.0)
Hemoglobin: 10.1 g/dL — ABNORMAL LOW (ref 13.0–17.0)
MCH: 34.1 pg — ABNORMAL HIGH (ref 26.0–34.0)
MCHC: 32.1 g/dL (ref 30.0–36.0)
MCV: 106.4 fL — ABNORMAL HIGH (ref 80.0–100.0)
Platelets: 307 10*3/uL (ref 150–400)
RBC: 2.96 MIL/uL — ABNORMAL LOW (ref 4.22–5.81)
RDW: 18.5 % — ABNORMAL HIGH (ref 11.5–15.5)
WBC: 10 10*3/uL (ref 4.0–10.5)
nRBC: 0.6 % — ABNORMAL HIGH (ref 0.0–0.2)

## 2019-12-29 LAB — COMPREHENSIVE METABOLIC PANEL
ALT: 17 U/L (ref 0–44)
AST: 16 U/L (ref 15–41)
Albumin: 3.5 g/dL (ref 3.5–5.0)
Alkaline Phosphatase: 125 U/L (ref 38–126)
Anion gap: 7 (ref 5–15)
BUN: 18 mg/dL (ref 8–23)
CO2: 41 mmol/L — ABNORMAL HIGH (ref 22–32)
Calcium: 8.8 mg/dL — ABNORMAL LOW (ref 8.9–10.3)
Chloride: 82 mmol/L — ABNORMAL LOW (ref 98–111)
Creatinine, Ser: 1.22 mg/dL (ref 0.61–1.24)
GFR, Estimated: >60 mL/min (ref >60)
Glucose, Bld: 115 mg/dL — ABNORMAL HIGH (ref 70–99)
Potassium: 3.8 mmol/L (ref 3.5–5.1)
Sodium: 130 mmol/L — ABNORMAL LOW (ref 135–145)
Total Bilirubin: 0.7 mg/dL (ref 0.3–1.2)
Total Protein: 8.3 g/dL — ABNORMAL HIGH (ref 6.5–8.1)

## 2019-12-29 LAB — RESP PANEL BY RT-PCR (FLU A&B, COVID) ARPGX2
Influenza A by PCR: NEGATIVE
Influenza B by PCR: NEGATIVE
SARS Coronavirus 2 by RT PCR: NEGATIVE

## 2019-12-29 LAB — LACTIC ACID, PLASMA: Lactic Acid, Venous: 0.8 mmol/L (ref 0.5–1.9)

## 2019-12-29 LAB — BRAIN NATRIURETIC PEPTIDE: B Natriuretic Peptide: 54 pg/mL (ref 0.0–100.0)

## 2019-12-29 MED ORDER — IPRATROPIUM-ALBUTEROL 0.5-2.5 (3) MG/3ML IN SOLN
3.0000 mL | Freq: Once | RESPIRATORY_TRACT | Status: AC
  Administered 2019-12-29: 22:00:00 3 mL via RESPIRATORY_TRACT
  Filled 2019-12-29: qty 3

## 2019-12-29 MED ORDER — SODIUM CHLORIDE 0.9 % IV SOLN
2.0000 g | INTRAVENOUS | Status: DC
  Administered 2019-12-30 – 2020-01-02 (×4): 2 g via INTRAVENOUS
  Filled 2019-12-29 (×4): qty 20

## 2019-12-29 MED ORDER — ONDANSETRON HCL 4 MG/2ML IJ SOLN: 4.0000 mg | Freq: Four times a day (QID) | INTRAMUSCULAR | Status: DC | PRN

## 2019-12-29 MED ORDER — POLYETHYLENE GLYCOL 3350 17 G PO PACK
17.0000 g | PACK | Freq: Every day | ORAL | Status: DC | PRN
  Filled 2019-12-29: qty 1

## 2019-12-29 MED ORDER — ALBUTEROL SULFATE (2.5 MG/3ML) 0.083% IN NEBU
2.5000 mg | INHALATION_SOLUTION | Freq: Once | RESPIRATORY_TRACT | Status: AC
  Administered 2019-12-29: 16:00:00 2.5 mg via RESPIRATORY_TRACT
  Filled 2019-12-29: qty 3

## 2019-12-29 MED ORDER — IPRATROPIUM-ALBUTEROL 0.5-2.5 (3) MG/3ML IN SOLN
3.0000 mL | Freq: Once | RESPIRATORY_TRACT | Status: AC
  Administered 2019-12-29: 16:00:00 3 mL via RESPIRATORY_TRACT
  Filled 2019-12-29: qty 3

## 2019-12-29 MED ORDER — PANTOPRAZOLE SODIUM 40 MG PO TBEC
40.0000 mg | DELAYED_RELEASE_TABLET | Freq: Every day | ORAL | Status: DC
  Administered 2019-12-30 – 2020-01-02 (×4): 40 mg via ORAL
  Filled 2019-12-29 (×4): qty 1

## 2019-12-29 MED ORDER — PREDNISONE 10 MG PO TABS: 40.0000 mg | ORAL_TABLET | Freq: Every day | ORAL | 0 refills | Status: DC

## 2019-12-29 MED ORDER — SODIUM CHLORIDE 0.9 % IV SOLN
1.0000 g | Freq: Once | INTRAVENOUS | Status: AC
  Administered 2019-12-29: 17:00:00 1 g via INTRAVENOUS
  Filled 2019-12-29: qty 10

## 2019-12-29 MED ORDER — ALBUTEROL SULFATE HFA 108 (90 BASE) MCG/ACT IN AERS: 4.0000 | INHALATION_SPRAY | Freq: Once | RESPIRATORY_TRACT | Status: DC

## 2019-12-29 MED ORDER — AZITHROMYCIN 500 MG PO TABS: 500.0000 mg | ORAL_TABLET | Freq: Every day | ORAL | 0 refills | Status: DC

## 2019-12-29 MED ORDER — ACETAMINOPHEN 325 MG PO TABS: 650.0000 mg | ORAL_TABLET | Freq: Four times a day (QID) | ORAL | Status: DC | PRN

## 2019-12-29 MED ORDER — METHYLPREDNISOLONE SODIUM SUCC 125 MG IJ SOLR: 60.0000 mg | Freq: Two times a day (BID) | INTRAMUSCULAR | Status: DC

## 2019-12-29 MED ORDER — ALBUTEROL SULFATE HFA 108 (90 BASE) MCG/ACT IN AERS
4.0000 | INHALATION_SPRAY | Freq: Once | RESPIRATORY_TRACT | Status: AC
  Administered 2019-12-29: 13:00:00 4 via RESPIRATORY_TRACT
  Filled 2019-12-29: qty 6.7

## 2019-12-29 MED ORDER — METHYLPREDNISOLONE SODIUM SUCC 125 MG IJ SOLR
125.0000 mg | Freq: Once | INTRAMUSCULAR | Status: AC
  Administered 2019-12-29: 14:00:00 125 mg via INTRAVENOUS
  Filled 2019-12-29: qty 2

## 2019-12-29 MED ORDER — ACETAMINOPHEN 650 MG RE SUPP: 650.0000 mg | Freq: Four times a day (QID) | RECTAL | Status: DC | PRN

## 2019-12-29 MED ORDER — GUAIFENESIN-DM 100-10 MG/5ML PO SYRP: 10.0000 mL | ORAL_SOLUTION | Freq: Four times a day (QID) | ORAL | 0 refills | Status: DC | PRN

## 2019-12-29 MED ORDER — FUROSEMIDE 40 MG PO TABS: 60.0000 mg | ORAL_TABLET | Freq: Every day | ORAL | Status: DC

## 2019-12-29 MED ORDER — IPRATROPIUM-ALBUTEROL 0.5-2.5 (3) MG/3ML IN SOLN
3.0000 mL | Freq: Four times a day (QID) | RESPIRATORY_TRACT | Status: DC
  Administered 2019-12-30 – 2019-12-31 (×6): 3 mL via RESPIRATORY_TRACT
  Filled 2019-12-29 (×6): qty 3

## 2019-12-29 MED ORDER — CEFDINIR 300 MG PO CAPS: 300.0000 mg | ORAL_CAPSULE | Freq: Two times a day (BID) | ORAL | 0 refills | Status: DC

## 2019-12-29 MED ORDER — IPRATROPIUM BROMIDE HFA 17 MCG/ACT IN AERS
2.0000 | INHALATION_SPRAY | Freq: Once | RESPIRATORY_TRACT | Status: DC
  Filled 2019-12-29: qty 12.9

## 2019-12-29 MED ORDER — GUAIFENESIN-DM 100-10 MG/5ML PO SYRP
10.0000 mL | ORAL_SOLUTION | Freq: Three times a day (TID) | ORAL | Status: AC
  Administered 2019-12-30 – 2019-12-31 (×4): 10 mL via ORAL
  Filled 2019-12-29 (×4): qty 10

## 2019-12-29 MED ORDER — SODIUM CHLORIDE 0.9 % IV SOLN
500.0000 mg | Freq: Once | INTRAVENOUS | Status: DC
  Filled 2019-12-29: qty 500

## 2019-12-29 MED ORDER — ONDANSETRON HCL 4 MG PO TABS: 4.0000 mg | ORAL_TABLET | Freq: Four times a day (QID) | ORAL | Status: DC | PRN

## 2019-12-29 MED ORDER — ENOXAPARIN SODIUM 40 MG/0.4ML ~~LOC~~ SOLN
40.0000 mg | SUBCUTANEOUS | Status: DC
  Administered 2019-12-30 – 2020-01-02 (×4): 40 mg via SUBCUTANEOUS
  Filled 2019-12-29 (×4): qty 0.4

## 2019-12-29 MED ORDER — IPRATROPIUM-ALBUTEROL 0.5-2.5 (3) MG/3ML IN SOLN
3.0000 mL | RESPIRATORY_TRACT | Status: DC | PRN
  Administered 2019-12-30 – 2019-12-31 (×2): 3 mL via RESPIRATORY_TRACT
  Filled 2019-12-29: qty 3

## 2019-12-29 MED ORDER — SODIUM CHLORIDE 0.9 % IV SOLN
500.0000 mg | INTRAVENOUS | Status: DC
  Administered 2019-12-30 – 2019-12-31 (×2): 500 mg via INTRAVENOUS
  Filled 2019-12-29 (×2): qty 500

## 2019-12-29 NOTE — ED Triage Notes (Signed)
Pt was evaluated for shortness of breath here earlier today and was recommended for admission.   Pt left AMA a few hours ago.  Pt is supposed to wear 3L O2 [place] at all times. Pt had his O2 on 1 L with sats in the high 70's when he entered triage.  This nurse turned his oxygen up to 3L and sats rose to 93 percent.

## 2019-12-29 NOTE — ED Provider Notes (Signed)
[facility] EMERGENCY DEPARTMENT Provider Note   CSN: [id number] Arrival date & time: 12/29/19  1923     History Chief Complaint  Patient presents with  . Shortness of Breath    Randall Sawyer is a 64 y.o. male.  Patient complains of shortness of breath.  Patient states his sats dropped down to 71% on 3 L.  Patient actually was going to be admitted to the hospital today for COPD exacerbation and possible pneumonia but he left AMA.  That was this afternoon.  He is willing to be admitted now  The history is provided by the patient and medical records. No language interpreter was used.  Shortness of Breath Severity:  Mild Onset quality:  Sudden Timing:  Constant Progression:  Worsening Chronicity:  New Context: activity   Relieved by:  Nothing Worsened by:  Nothing Ineffective treatments:  None tried Associated symptoms: wheezing   Associated symptoms: no abdominal pain, no chest pain, no cough, no headaches and no rash   Risk factors: no recent alcohol use        Past Medical History:  Diagnosis Date  . Asthma   . 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 COPD (chronic obstructive pulmonary disease) ([place])   . Hypertension   . Nephrolithiasis   . Tobacco abuse     Patient Active Problem List   Diagnosis Date Noted  . COPD with acute exacerbation ([place]) 12/29/2019  . Olecranon bursitis of both elbows 03/01/2018  . 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 Bone mass 03/28/2017  . Pulmonary infiltrates 03/02/2017  . Chronic respiratory failure with hypoxia and hypercapnia ([place]) 07/27/2014  . Bilateral leg edema 02/14/2014  . Dyspnea 01/21/2014  . Kidney stones   . 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 BIOPSY N/A 02/26/2015   Procedure: BIOPSY;  Surgeon: Rogene Houston, MD;  [facility];  Service: Endoscopy;  Laterality: N/A;  Gastric  biopsies  . ESOPHAGOGASTRODUODENOSCOPY N/A 11/19/2014   Procedure: ESOPHAGOGASTRODUODENOSCOPY (EGD);  Surgeon: Rogene Houston, MD;  Location: [facility];  Service: Endoscopy;  Laterality: N/A;  2:15-rescheduled to 11/9 @1 :35 Ann notified pt  . ESOPHAGOGASTRODUODENOSCOPY N/A 02/26/2015   Procedure: ESOPHAGOGASTRODUODENOSCOPY (EGD);  Surgeon: Rogene Houston, MD;  Location: [facility];  Service: Endoscopy;  Laterality: N/A;  1225       Family History  Problem Relation Age of Onset  . COPD Father   . Heart disease Father   . Asthma Father   . Diabetes type II Mother   . Diabetes Sister     Social History   Tobacco Use  . Smoking status: Former Smoker    Packs/day: 0.25    Years: 51.00    Pack years: 12.75    Types: Cigarettes    Start date: 12/30/1964    Quit date: 05/21/2017    Years since quitting: 2.6  . Smokeless tobacco: Never Used  Vaping Use  . Vaping Use: Never used  Substance Use Topics  . Alcohol use: No    Alcohol/week: 0.0 standard drinks  . Drug use: No    Home Medications Prior to Admission medications   Medication Sig Start Date End Date Taking? Authorizing Provider  azithromycin (ZITHROMAX) 500 MG tablet Take 1 tablet (500 mg total) by mouth daily for 4 days. Take 1 tablet  daily for 3 days. 12/30/19 01/03/20  Emokpae, Ejiroghene E, MD  cefdinir (OMNICEF) 300 MG capsule Take 1 capsule (300 mg total) by mouth 2 (two) times daily. 12/30/19   Bethena Roys, MD  DM-Doxylamine-Acetaminophen 15-6.25-325 MG/15ML LIQD Take 0.5 mLs by mouth daily as needed.    [provider]  furosemide (LASIX) 40 MG tablet TAKE 1 AND 1/2 TABLETS BY MOUTH DAILY 04/22/19   Susy Frizzle, MD  Glycopyrrolate-Formoterol (BEVESPI AEROSPHERE) 9-4.8 MCG/ACT AERO Inhale 2 puffs into the lungs 2 (two) times daily. 02/05/19   Tanda Rockers, MD  guaiFENesin-dextromethorphan (ROBITUSSIN DM) 100-10 MG/5ML syrup Take 10 mLs by mouth every 6 (six) hours as needed for up to  3 days for cough. 12/29/19 01/01/20  Emokpae, Ejiroghene E, MD  ipratropium-albuterol (DUONEB) 0.5-2.5 (3) MG/3ML SOLN Take 3 mLs by nebulization every 6 (six) hours as needed.    [provider]  naproxen sodium (ALEVE) 220 MG tablet Take 220 mg by mouth daily as needed.    [provider]  OXYGEN Inhale 3 L into the lungs daily. And 3 lpm with exertion    [provider]  pantoprazole (PROTONIX) 40 MG tablet Take 1 tablet PO qAM 30 min prior to breakfast 09/19/19   Laurine Blazer A, PA-C  predniSONE (DELTASONE) 10 MG tablet Take 4 tablets (40 mg total) by mouth daily for 5 days. 40 mg- 4 tablets- for 5 days,  30 mg- 3 tablets for 4 days,  20 mg- 2 tablets for 3 days,  Then 10 mg for 2 days. 12/30/19 01/04/20  Emokpae, Leanne Chang, MD  PROVENTIL HFA 108 (90 Base) MCG/ACT inhaler INHALE 2 PUFFS BY MOUTH EVERY 4 HOURS Patient taking differently: Inhale 2 puffs into the lungs every 4 (four) hours as needed for shortness of breath. 05/02/19   Alycia Rossetti, MD    Allergies    Patient has no known allergies.  Review of Systems   Review of Systems  Constitutional: Negative for appetite change and fatigue.  HENT: Negative for congestion, ear discharge and sinus pressure.   Eyes: Negative for discharge.  Respiratory: Positive for shortness of breath and wheezing. Negative for cough.   Cardiovascular: Negative for chest pain.  Gastrointestinal: Negative for abdominal pain and diarrhea.  Genitourinary: Negative for frequency and hematuria.  Musculoskeletal: Negative for back pain.  Skin: Negative for rash.  Neurological: Negative for seizures and headaches.  Psychiatric/Behavioral: Negative for hallucinations.    Physical Exam Updated Vital Signs Ht 5\' 8"  (1.727 m)   Wt 106.6 kg   SpO2 94%   BMI 35.73 kg/m   Physical Exam Vitals and nursing note reviewed.  Constitutional:      Appearance: He is well-developed.  HENT:     Head: Normocephalic.     Nose:  Nose normal.  Eyes:     General: No scleral icterus.    Extraocular Movements: EOM normal.     Conjunctiva/sclera: Conjunctivae normal.  Neck:     Thyroid: No thyromegaly.  Cardiovascular:     Rate and Rhythm: Normal rate and regular rhythm.     Heart sounds: No murmur heard. No friction rub. No gallop.   Pulmonary:     Breath sounds: No stridor. Wheezing present. No rales.  Chest:     Chest wall: No tenderness.  Abdominal:     General: There is no distension.     Tenderness: There is no abdominal tenderness. There is no rebound.  Musculoskeletal:  General: No edema. Normal range of motion.     Cervical back: Neck supple.  Lymphadenopathy:     Cervical: No cervical adenopathy.  Skin:    Findings: No erythema or rash.  Neurological:     Mental Status: He is alert and oriented to person, place, and time.     Motor: No abnormal muscle tone.     Coordination: Coordination normal.  Psychiatric:        Mood and Affect: Mood and affect normal.        Behavior: Behavior normal.     ED Results / Procedures / Treatments   Labs (all labs ordered are listed, but only abnormal results are displayed) Labs Reviewed - No data to display  EKG None  Radiology DG Chest [facility] 1 View  Result Date: 12/29/2019 CLINICAL DATA:  Dyspnea, COPD, history of right sixth rib chondrosarcoma, former smoker EXAM: PORTABLE CHEST 1 VIEW COMPARISON:  03/02/2017 chest radiograph. FINDINGS: Stable cardiomediastinal silhouette with normal heart size. No pneumothorax. Patchy reticulonodular opacities at both lung bases are new. No overt pulmonary edema. No acute consolidative airspace disease. No pleural effusions. IMPRESSION: New nonspecific patchy reticulonodular opacities at both lung bases, differential includes aspiration, bronchopneumonia or fibrosis. Follow-up chest radiographs advised. Electronically Signed   By: Ilona Sorrel M.D.   On: 12/29/2019 13:59    Procedures Procedures (including  critical care time)  Medications Ordered in ED Medications  ipratropium-albuterol (DUONEB) 0.5-2.5 (3) MG/3ML nebulizer solution 3 mL (has no administration in time range)  ipratropium-albuterol (DUONEB) 0.5-2.5 (3) MG/3ML nebulizer solution 3 mL (has no administration in time range)    ED Course  I have reviewed the triage vital signs and the nursing notes.  Pertinent labs & imaging results that were available during my care of the patient were reviewed by me and considered in my medical decision making (see chart for details).    MDM Rules/Calculators/A&P                          Patient with COPD exacerbation and possible pneumonia.  His entire work-up was done a few hours ago and will not be repeated.  I spoke with the hospitalist who saw the patient earlier and she will admit the patient Final Clinical Impression(s) / ED Diagnoses Final diagnoses:  COPD exacerbation [facility])    Rx / DC Orders ED Discharge Orders    None       Milton Ferguson, MD 12/29/19 2145

## 2019-12-29 NOTE — ED Triage Notes (Signed)
Pt c/o of shortness of breath that began to worsen on Friday. Pt wears 3L [place] O2 at home but enters the ED on 2L because he is trying to save oxygen.  Pt is using accessory muscles and pursed lip breathing. Oxygen saturation is 89 percent on 3L [place]   Hx COPD

## 2019-12-29 NOTE — Discharge Instructions (Signed)

## 2019-12-29 NOTE — ED Provider Notes (Signed)
[facility] Provider Note   CSN: [id number] Arrival date & time: 12/29/19  1227     History Chief Complaint  Patient presents with  . Shortness of Breath    Randall Sawyer is a 64 y.o. male.  Patient with hx copd, c/o increased trouble breathing in the past few days. Symptoms acute onset, moderate, persistent, slowly worse. +increased non prod cough. No sore throat or runny nose. No known ill contacts, no known flu or covid exposure. Denies chest pain or discomfort. Notes chronic swelling bilateral ankles/lower legs - denies acute worsening. Denies fever or chills. Former smoker. Uses home o2 3 liters.   The history is provided by the patient.  Shortness of Breath Associated symptoms: cough and wheezing   Associated symptoms: no abdominal pain, no chest pain, no fever, no headaches, no neck pain, no rash, no sore throat and no vomiting        Past Medical History:  Diagnosis Date  . Asthma   . 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 COPD (chronic obstructive pulmonary disease) ([place])   . Hypertension   . Nephrolithiasis   . Tobacco abuse     Patient Active Problem List   Diagnosis Date Noted  . Olecranon bursitis of both elbows 03/01/2018  . 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 Bone mass 03/28/2017  . Pulmonary infiltrates 03/02/2017  . Chronic respiratory failure with hypoxia and hypercapnia ([place]) 07/27/2014  . Bilateral leg edema 02/14/2014  . Dyspnea 01/21/2014  . Kidney stones   . 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 BIOPSY N/A 02/26/2015   Procedure: BIOPSY;  Surgeon: Rogene Houston, MD;  [facility];  Service: Endoscopy;  Laterality: N/A;  Gastric biopsies  . ESOPHAGOGASTRODUODENOSCOPY N/A 11/19/2014   Procedure: ESOPHAGOGASTRODUODENOSCOPY (EGD);  Surgeon: Rogene Houston, MD;  Location[facility];   Service: Endoscopy;  Laterality: N/A;  2:15-rescheduled to 11/9 @1 :54 Ann notified pt  . ESOPHAGOGASTRODUODENOSCOPY N/A 02/26/2015   Procedure: ESOPHAGOGASTRODUODENOSCOPY (EGD);  Surgeon: Rogene Houston, MD;  Location[facility];  Service: Endoscopy;  Laterality: N/A;  1225       Family History  Problem Relation Age of Onset  . COPD Father   . Heart disease Father   . Asthma Father   . Diabetes type II Mother   . Diabetes Sister     Social History   Tobacco Use  . Smoking status: Former Smoker    Packs/day: 0.25    Years: 51.00    Pack years: 12.75    Types: Cigarettes    Start date: 12/30/1964    Quit date: 05/21/2017    Years since quitting: 2.6  . Smokeless tobacco: Never Used  Vaping Use  . Vaping Use: Never used  Substance Use Topics  . Alcohol use: No    Alcohol/week: 0.0 standard drinks  . Drug use: No    Home Medications Prior to Admission medications   Medication Sig Start Date End Date Taking? Authorizing Provider  furosemide (LASIX) 40 MG tablet TAKE 1 AND 1/2 TABLETS BY MOUTH DAILY 04/22/19   Susy Frizzle, MD  Glycopyrrolate-Formoterol (BEVESPI AEROSPHERE) 9-4.8 MCG/ACT AERO Inhale 2 puffs into the lungs 2 (two) times daily. 02/05/19   Tanda Rockers, MD  ipratropium-albuterol (DUONEB) 0.5-2.5 (3) MG/3ML SOLN Take 3  mLs by nebulization every 6 (six) hours as needed.    [provider]  OXYGEN Inhale 3 L into the lungs at bedtime. And 3 lpm with exertion    [provider]  pantoprazole (PROTONIX) 40 MG tablet Take 1 tablet PO qAM 30 min prior to breakfast 09/19/19   Bertram Gala  PROVENTIL HFA 108 [phone] Base) MCG/ACT inhaler INHALE 2 PUFFS BY MOUTH EVERY 4 HOURS 05/02/19   [place], Modena Nunnery, MD    Allergies    Patient has no known allergies.  Review of Systems   Review of Systems  Constitutional: Negative for fever.  HENT: Negative for sore throat.   Eyes: Negative for redness.  Respiratory: Positive for cough,  shortness of breath and wheezing.   Cardiovascular: Negative for chest pain.  Gastrointestinal: Negative for abdominal pain, diarrhea and vomiting.  Endocrine: Negative for polyuria.  Genitourinary: Negative for flank pain.  Musculoskeletal: Negative for back pain and neck pain.  Skin: Negative for rash.  Neurological: Negative for headaches.  Hematological: Does not bruise/bleed easily.  Psychiatric/Behavioral: Negative for confusion.    Physical Exam Updated Vital Signs Pulse 97   Temp 97.9 F (36.6 C) (Oral)   Resp (!) 22   Ht 1.727 m (5\' 8" )   Wt 106.6 kg   SpO2 92%   BMI 35.73 kg/m   Physical Exam Vitals and nursing note reviewed.  Constitutional:      Appearance: Normal appearance. He is well-developed.  HENT:     Head: Atraumatic.     Nose: Nose normal.     Mouth/Throat:     Mouth: Mucous membranes are moist.     Pharynx: Oropharynx is clear.  Eyes:     General: No scleral icterus.    Conjunctiva/sclera: Conjunctivae normal.  Neck:     Trachea: No tracheal deviation.  Cardiovascular:     Rate and Rhythm: Normal rate and regular rhythm.     Pulses: Normal pulses.     Heart sounds: Normal heart sounds. No murmur heard. No friction rub. No gallop.   Pulmonary:     Effort: Pulmonary effort is normal. No accessory muscle usage or respiratory distress.     Breath sounds: Wheezing present.  Abdominal:     General: Bowel sounds are normal. There is no distension.     Palpations: Abdomen is soft.     Tenderness: There is no abdominal tenderness. There is no guarding.  Genitourinary:    Comments: No cva tenderness. Musculoskeletal:     Cervical back: Normal range of motion and neck supple. No rigidity.     Comments: Symmetric moderate swelling bilateral lower legs/ankles.   Skin:    General: Skin is warm and dry.     Findings: No rash.  Neurological:     Mental Status: He is alert.     Comments: Alert, speech clear.   Psychiatric:        Mood and Affect:  Mood normal.     ED Results / Procedures / Treatments   Labs (all labs ordered are listed, but only abnormal results are displayed) Results for orders placed or performed during the hospital encounter of 12/29/19  CBC  Result Value Ref Range   WBC 10.0 4.0 - 10.5 K/uL   RBC 2.96 (L) 4.22 - 5.81 MIL/uL   Hemoglobin 10.1 (L) 13.0 - 17.0 g/dL   HCT 31.5 (L) 39.0 - 52.0 %   MCV 106.4 (H) 80.0 - 100.0 fL   MCH 34.1 (  H) 26.0 - 34.0 pg   MCHC 32.1 30.0 - 36.0 g/dL   RDW 18.5 (H) 11.5 - 15.5 %   Platelets 307 150 - 400 K/uL   nRBC 0.6 (H) 0.0 - 0.2 %  Comprehensive metabolic panel  Result Value Ref Range   Sodium 130 (L) 135 - 145 mmol/L   Potassium 3.8 3.5 - 5.1 mmol/L   Chloride 82 (L) 98 - 111 mmol/L   CO2 41 (H) 22 - 32 mmol/L   Glucose, Bld 115 (H) 70 - 99 mg/dL   BUN 18 8 - 23 mg/dL   Creatinine, Ser 1.22 0.61 - 1.24 mg/dL   Calcium 8.8 (L) 8.9 - 10.3 mg/dL   Total Protein 8.3 (H) 6.5 - 8.1 g/dL   Albumin 3.5 3.5 - 5.0 g/dL   AST 16 15 - 41 U/L   ALT 17 0 - 44 U/L   Alkaline Phosphatase 125 38 - 126 U/L   Total Bilirubin 0.7 0.3 - 1.2 mg/dL   GFR, Estimated >60 >60 mL/min   Anion gap 7 5 - 15  Brain natriuretic peptide  Result Value Ref Range   B Natriuretic Peptide 54.0 0.0 - 100.0 pg/mL   DG Chest Port 1 View  Result Date: 12/29/2019 CLINICAL DATA:  Dyspnea, COPD, history of right sixth rib chondrosarcoma, former smoker EXAM: PORTABLE CHEST 1 VIEW COMPARISON:  03/02/2017 chest radiograph. FINDINGS: Stable cardiomediastinal silhouette with normal heart size. No pneumothorax. Patchy reticulonodular opacities at both lung bases are new. No overt pulmonary edema. No acute consolidative airspace disease. No pleural effusions. IMPRESSION: New nonspecific patchy reticulonodular opacities at both lung bases, differential includes aspiration, bronchopneumonia or fibrosis. Follow-up chest radiographs advised. Electronically Signed   By: Ilona Sorrel M.D.   On: 12/29/2019 13:59      EKG EKG Interpretation  Date/Time:  Sunday December 29 2019 12:47:26 EST Ventricular Rate:  94 PR Interval:    QRS Duration: 102 QT Interval:  379 QTC Calculation: 474 R Axis:   54 Text Interpretation: Sinus rhythm No previous tracing Confirmed by Lajean Saver [id number]) on 12/29/2019 12:53:03 PM   Radiology DG Chest Port 1 View  Result Date: 12/29/2019 CLINICAL DATA:  Dyspnea, COPD, history of right sixth rib chondrosarcoma, former smoker EXAM: PORTABLE CHEST 1 VIEW COMPARISON:  03/02/2017 chest radiograph. FINDINGS: Stable cardiomediastinal silhouette with normal heart size. No pneumothorax. Patchy reticulonodular opacities at both lung bases are new. No overt pulmonary edema. No acute consolidative airspace disease. No pleural effusions. IMPRESSION: New nonspecific patchy reticulonodular opacities at both lung bases, differential includes aspiration, bronchopneumonia or fibrosis. Follow-up chest radiographs advised. Electronically Signed   By: Ilona Sorrel M.D.   On: 12/29/2019 13:59    Procedures Procedures (including critical care time)  Medications Ordered in ED Medications  albuterol (VENTOLIN HFA) 108 (90 Base) MCG/ACT inhaler 4 puff (has no administration in time range)    ED Course  I have reviewed the triage vital signs and the nursing notes.  Pertinent labs & imaging results that were available during my care of the patient were reviewed by me and considered in my medical decision making (see chart for details).    MDM Rules/Calculators/A&P                         Iv ns. Continuous pulse ox and cardiac monitoring. Stat labs. Pcxr.   Reviewed nursing notes and prior charts for additional history.   Albuterol treatment given. Solumedrol iv.  Labs reviewed/interpreted by me - NA mildly low. hco3 elev c/w chr resp failure. Wbc normal.   CXR reviewed/interpreted by me - ?basilar opacities vs fibrosis.   COVID swab remains pending.   Additional  albuterol/atrvent tx.   Given infiltrate on cxr, will cover for possible pna. Cxs sent. Lactate. Rocephin and zithromax iv.   Wheezing improved but persists, w increased o2 requirement - will admit.   Hospitalists consulted for admission.      Final Clinical Impression(s) / ED Diagnoses Final diagnoses:  None    Rx / DC Orders ED Discharge Orders    None       Lajean Saver, MD 12/29/19 1528

## 2019-12-29 NOTE — ED Notes (Signed)
Respiratory in room assessing pt.

## 2019-12-29 NOTE — H&P (Addendum)
History and Physical    BURDELL PEED QVZ:[id number] DOB: 1955/07/23 DOA: 12/29/2019  PCP: Susy Frizzle, MD   Patient coming from: Home  I have personally briefly reviewed patient's old medical records in [place]  Chief Complaint: Cough, increasing difficulty breathing  HPI: Randall Sawyer is a 64 y.o. male with medical history significant for COPD and asthma with chronic respiratory failure on 3 - 4 L of oxygen, primary chondrosarcoma of the ribs, hypertension. Patient presented to the ED with complaints of increasing difficulty breathing that started on Friday 2 days ago.  Reports productive cough.  No chest pain.  He has chronic and unchanged bilateral lower extremity swelling.  ED Course: Stable vitals.  WBC 10.  Sodium 130.  BNP 54.  Lactic acid 0.8.  New nonspecific patchy reticulonodular opacities at both lung bases,-aspiration versus bronchopneumonia or fibrosis. Ceftriaxone and azithromycin started.  Bronchodilators given.  On my evaluation patient was on 4 L nasal cannula with O2 sats ranging from 85 to 89%.  Explained to patient why patient needed to be hospitalized especially with his hypoxia, he voiced understanding and he insisted on leaving AMA, signed AMA papers, I prescribed antibiotics, steroids and mucolytic's.  Return precautions emphasized to both patient and son.  Patient's son present at bedside. Few hours later, patient was back in the ED, reported that his O2 sats dropped to 71% so he came back to the ED.  Review of Systems: As per HPI all other systems reviewed and negative.  Past Medical History:  Diagnosis Date  . Asthma   . 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 COPD (chronic obstructive pulmonary disease) ([place])   . Hypertension   . Nephrolithiasis   . Tobacco abuse     Past Surgical History:  Procedure Laterality Date  . BIOPSY N/A 02/26/2015   Procedure: BIOPSY;  Surgeon: Rogene Houston, MD;   [facility];  Service: Endoscopy;  Laterality: N/A;  Gastric biopsies  . ESOPHAGOGASTRODUODENOSCOPY N/A 11/19/2014   Procedure: ESOPHAGOGASTRODUODENOSCOPY (EGD);  Surgeon: Rogene Houston, MD;  [facility];  Service: Endoscopy;  Laterality: N/A;  2:15-rescheduled to 11/9 @1 :50 Ann notified pt  . ESOPHAGOGASTRODUODENOSCOPY N/A 02/26/2015   Procedure: ESOPHAGOGASTRODUODENOSCOPY (EGD);  Surgeon: Rogene Houston, MD;  [facility];  Service: Endoscopy;  Laterality: N/A;  1225     reports that he quit smoking about 2 years ago. His smoking use included cigarettes. He started smoking about 55 years ago. He has a 12.75 pack-year smoking history. He has never used smokeless tobacco. He reports that he does not drink alcohol and does not use drugs.  No Known Allergies  Family History  Problem Relation Age of Onset  . COPD Father   . Heart disease Father   . Asthma Father   . Diabetes type II Mother   . Diabetes Sister     Prior to Admission medications   Medication Sig Start Date End Date Taking? Authorizing Provider  DM-Doxylamine-Acetaminophen 15-6.25-325 MG/15ML LIQD Take 0.5 mLs by mouth daily as needed.    [provider]  furosemide (LASIX) 40 MG tablet TAKE 1 AND 1/2 TABLETS BY MOUTH DAILY 04/22/19   Susy Frizzle, MD  Glycopyrrolate-Formoterol (BEVESPI AEROSPHERE) 9-4.8 MCG/ACT AERO Inhale 2 puffs into the lungs 2 (two) times daily. 02/05/19   Tanda Rockers, MD  guaiFENesin-dextromethorphan (ROBITUSSIN DM) 100-10 MG/5ML syrup Take 10 mLs by mouth every  6 (six) hours as needed for up to 3 days for cough. 12/29/19 01/01/20  Kaylina Cahue E, MD  ipratropium-albuterol (DUONEB) 0.5-2.5 (3) MG/3ML SOLN Take 3 mLs by nebulization every 6 (six) hours as needed.    [provider]  naproxen sodium (ALEVE) 220 MG tablet Take 220 mg by mouth daily as needed.    [provider]  OXYGEN Inhale 3 L into the lungs daily. And 3 lpm with  exertion    [provider]  pantoprazole (PROTONIX) 40 MG tablet Take 1 tablet PO qAM 30 min prior to breakfast 09/19/19   Minus Liberty, PA-C  PROVENTIL HFA 108 (90 Base) MCG/ACT inhaler INHALE 2 PUFFS BY MOUTH EVERY 4 HOURS Patient taking differently: Inhale 2 puffs into the lungs every 4 (four) hours as needed for shortness of breath. 05/02/19   Alycia Rossetti, MD    Physical Exam: Vitals:   12/29/19 2230 12/29/19 2233 12/29/19 2300 12/29/19 2335  BP: (!) 163/95  117/64 (!) 124/99  Pulse: 100  (!) 109 (!) 103  Resp: (!) 22  (!) 22 20  Temp:  98.3 F (36.8 C) 98 F (36.7 C) 98.4 F (36.9 C)  TempSrc:  Oral Oral Oral  SpO2: 93%  95% 100%  Weight:      Height:        Constitutional: Moderate increased work of breathing. Vitals:   12/29/19 2230 12/29/19 2233 12/29/19 2300 12/29/19 2335  BP: (!) 163/95  117/64 (!) 124/99  Pulse: 100  (!) 109 (!) 103  Resp: (!) 22  (!) 22 20  Temp:  98.3 F (36.8 C) 98 F (36.7 C) 98.4 F (36.9 C)  TempSrc:  Oral Oral Oral  SpO2: 93%  95% 100%  Weight:      Height:       Eyes: PERRL, lids and conjunctivae normal ENMT: Mucous membranes are moist.  Neck: normal, supple, no masses, no thyromegaly Respiratory: Diffuse expiratory wheezing.  With mild increased use of abdominal accessory muscle use.  Cardiovascular: Regular rate and rhythm, no murmurs / rubs / gallops.  Trace bilateral extremity edema. 2+ pedal pulses.  Abdomen: no tenderness, no masses palpated. No hepatosplenomegaly. Bowel sounds positive.  Musculoskeletal: no clubbing / cyanosis. No joint deformity upper and lower extremities. Good ROM, no contractures. Normal muscle tone.  Skin: no rashes, lesions, ulcers. No induration Neurologic: No apparent cranial abnormality, moving extremities spontaneously.  Psychiatric: Normal judgment and insight. Alert and oriented x 3. Normal mood.   Labs on Admission: I have personally reviewed following labs and imaging  studies  CBC: Recent Labs  Lab 12/29/19 1310  WBC 10.0  HGB 10.1*  HCT 31.5*  MCV 106.4*  PLT 440   Basic Metabolic Panel: Recent Labs  Lab 12/29/19 1310  NA 130*  K 3.8  CL 82*  CO2 41*  GLUCOSE 115*  BUN 18  CREATININE 1.22  CALCIUM 8.8*    Liver Function Tests: Recent Labs  Lab 12/29/19 1310  AST 16  ALT 17  ALKPHOS 125  BILITOT 0.7  PROT 8.3*  ALBUMIN 3.5   Radiological Exams on Admission: DG Chest Port 1 View  Result Date: 12/29/2019 CLINICAL DATA:  Dyspnea, COPD, history of right sixth rib chondrosarcoma, former smoker EXAM: PORTABLE CHEST 1 VIEW COMPARISON:  03/02/2017 chest radiograph. FINDINGS: Stable cardiomediastinal silhouette with normal heart size. No pneumothorax. Patchy reticulonodular opacities at both lung bases are new. No overt pulmonary edema. No acute consolidative airspace disease. No pleural effusions.  IMPRESSION: New nonspecific patchy reticulonodular opacities at both lung bases, differential includes aspiration, bronchopneumonia or fibrosis. Follow-up chest radiographs advised. Electronically Signed   By: Ilona Sorrel M.D.   On: 12/29/2019 13:59    EKG: Independently reviewed.  Sinus rhythm, QTC 474.  No change from prior.  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  Likely provoked by pneumonia.  Covid and influenza test negative.  -DuoNebs scheduled, as needed -IV Solu-Medrol given previously in the ED, continue 60 twice daily -Antibiotics, mucolytic's.  Acute on chronic hypoxic respiratory failure- O2 sats 87% on 3 L, on my evaluation O2 sats 83 to 89% on 4 L.  Likely from COPD exacerbation and pneumonia. -Supplemental O2 -Flutter valve  Pneumonia-rules  out for sepsis.  WBC 10.  Chest x-ray-new nonspecific patchy reticulonodular opacities at both lung bases-bronchopneumonia or fibrosis.  Follow-up chest x-ray advised. -IV ceftriaxone and azithromycin -Speech therapy evaluation -Follow-up blood cultures ordered in ED.  Chondrosarcoma of the Ribs- underwent 6th rib resection.  Follows with cardiothoracic surgery.  Details in care everywhere.  Chronic lower extremity edema-unchanged, resume home Lasix 60 mg daily in a.m  DVT prophylaxis:  Lovenox Code Status: Full code Family Communication: Son at bedside Disposition Plan:  ~ 2 days, pending improvement in respiratory status and treatment of pneumonia. Consults called: None Admission status: Inpt, tele I certify that at the point of admission it is my clinical judgment that the patient will require inpatient hospital care spanning beyond 2 midnights from the point of admission due to high intensity of service, high risk for further deterioration and high frequency of surveillance required.    Bethena Roys MD [facility]  12/29/2019, 11:53 PM

## 2019-12-29 NOTE — Plan of Care (Signed)
  Problem: Education: Goal: Knowledge of General Education information will improve Description: Including pain rating scale, medication(s)/side effects and non-pharmacologic comfort measures Outcome: Not Progressing   Problem: Clinical Measurements: Goal: Ability to maintain clinical measurements within normal limits will improve Outcome: Progressing   Problem: Elimination: Goal: Will not experience complications related to bowel motility Outcome: Progressing   Problem: Pain Managment: Goal: General experience of comfort will improve Outcome: Progressing

## 2019-12-30 ENCOUNTER — Inpatient Hospital Stay (HOSPITAL_COMMUNITY): Payer: Medicare Other

## 2019-12-30 DIAGNOSIS — I509 Heart failure, unspecified: Secondary | ICD-10-CM

## 2019-12-30 DIAGNOSIS — J9621 Acute and chronic respiratory failure with hypoxia: Secondary | ICD-10-CM

## 2019-12-30 DIAGNOSIS — E8770 Fluid overload, unspecified: Secondary | ICD-10-CM

## 2019-12-30 LAB — URINALYSIS, ROUTINE W REFLEX MICROSCOPIC
Bacteria, UA: NONE SEEN
Bilirubin Urine: NEGATIVE
Glucose, UA: NEGATIVE mg/dL
Ketones, ur: NEGATIVE mg/dL
Leukocytes,Ua: NEGATIVE
Nitrite: NEGATIVE
Protein, ur: NEGATIVE mg/dL
Specific Gravity, Urine: 1.012 (ref 1.005–1.030)
pH: 5 (ref 5.0–8.0)

## 2019-12-30 LAB — CBC
HCT: 31.7 % — ABNORMAL LOW (ref 39.0–52.0)
Hemoglobin: 10 g/dL — ABNORMAL LOW (ref 13.0–17.0)
MCH: 33.9 pg (ref 26.0–34.0)
MCHC: 31.5 g/dL (ref 30.0–36.0)
MCV: 107.5 fL — ABNORMAL HIGH (ref 80.0–100.0)
Platelets: 351 10*3/uL (ref 150–400)
RBC: 2.95 MIL/uL — ABNORMAL LOW (ref 4.22–5.81)
RDW: 18.7 % — ABNORMAL HIGH (ref 11.5–15.5)
WBC: 12.7 10*3/uL — ABNORMAL HIGH (ref 4.0–10.5)
nRBC: 0.7 % — ABNORMAL HIGH (ref 0.0–0.2)

## 2019-12-30 LAB — ECHOCARDIOGRAM COMPLETE
Area-P 1/2: 4.52 cm2
Height: 68 in
S' Lateral: 3.27 cm
Weight: 3759.99 oz

## 2019-12-30 LAB — BASIC METABOLIC PANEL
Anion gap: 11 (ref 5–15)
BUN: 20 mg/dL (ref 8–23)
CO2: 37 mmol/L — ABNORMAL HIGH (ref 22–32)
Calcium: 8.7 mg/dL — ABNORMAL LOW (ref 8.9–10.3)
Chloride: 81 mmol/L — ABNORMAL LOW (ref 98–111)
Creatinine, Ser: 1.22 mg/dL (ref 0.61–1.24)
GFR, Estimated: >60 mL/min (ref >60)
Glucose, Bld: 137 mg/dL — ABNORMAL HIGH (ref 70–99)
Potassium: 4.2 mmol/L (ref 3.5–5.1)
Sodium: 129 mmol/L — ABNORMAL LOW (ref 135–145)

## 2019-12-30 LAB — HIV ANTIBODY (ROUTINE TESTING W REFLEX): HIV Screen 4th Generation wRfx: NONREACTIVE

## 2019-12-30 MED ORDER — METHYLPREDNISOLONE SODIUM SUCC 40 MG IJ SOLR
40.0000 mg | Freq: Two times a day (BID) | INTRAMUSCULAR | Status: DC
  Administered 2019-12-30 – 2019-12-31 (×3): 40 mg via INTRAVENOUS
  Filled 2019-12-30 (×3): qty 1

## 2019-12-30 MED ORDER — FUROSEMIDE 10 MG/ML IJ SOLN
40.0000 mg | Freq: Two times a day (BID) | INTRAMUSCULAR | Status: DC
  Administered 2019-12-30 – 2019-12-31 (×3): 40 mg via INTRAVENOUS
  Filled 2019-12-30 (×3): qty 4

## 2019-12-30 NOTE — Progress Notes (Signed)
CCMD has called multiple times because the patient isn't staying on the monitor.  I have personally changed his leads multiple times and I just changed his box to number 25.

## 2019-12-30 NOTE — Progress Notes (Signed)
*  PRELIMINARY RESULTS* Echocardiogram 2D Echocardiogram has been performed.  Leavy Cella 12/30/2019, 11:07 AM

## 2019-12-30 NOTE — Evaluation (Signed)
Clinical/Bedside Swallow Evaluation Patient Details  Name: Randall Sawyer MRN: [id number] Date of Birth: 25-Sep-1955  Today's Date: 12/30/2019 Time: SLP Start Time (ACUTE ONLY): 8453 SLP Stop Time (ACUTE ONLY): 0946 SLP Time Calculation (min) (ACUTE ONLY): 24 min  Past Medical History:  Past Medical History:  Diagnosis Date  . Asthma   . 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 COPD (chronic obstructive pulmonary disease) ([place])   . Hypertension   . Nephrolithiasis   . Tobacco abuse    Past Surgical History:  Past Surgical History:  Procedure Laterality Date  . BIOPSY N/A 02/26/2015   Procedure: BIOPSY;  Surgeon: Rogene Houston, MD;  [facility];  Service: Endoscopy;  Laterality: N/A;  Gastric biopsies  . ESOPHAGOGASTRODUODENOSCOPY N/A 11/19/2014   Procedure: ESOPHAGOGASTRODUODENOSCOPY (EGD);  Surgeon: Rogene Houston, MD;  [facility];  Service: Endoscopy;  Laterality: N/A;  2:15-rescheduled to 11/9 @1 :74 Ann notified pt  . ESOPHAGOGASTRODUODENOSCOPY N/A 02/26/2015   Procedure: ESOPHAGOGASTRODUODENOSCOPY (EGD);  Surgeon: Rogene Houston, MD;  [facility];  Service: Endoscopy;  Laterality: N/A;  71   HPI:  64 y.o. male with medical history significant for COPD and asthma with chronic respiratory failure on 3 - 4 L of oxygen, primary chondrosarcoma of the ribs, hypertension. Patient presented to the ED on 12/29/19 with complaints of increasing difficulty breathing that started on Friday 2 days ago.  Reports productive cough.  No chest pain.  He has chronic and unchanged bilateral lower extremity swelling; CXR on 12/29/19 indicated New nonspecific patchy reticulonodular opacities at both lung bases, differential includes aspiration, bronchopneumonia or fibrosis. Follow-up chest radiographs advised.  BSE generated to assess swallow function.  Assessment / Plan / Recommendation Clinical Impression  Pt seen for BSE with  various consistencies assessed including thin via cup/straw, puree and solids with dentures for mastication utilized. Pt exhibited a delayed cough after all intake (not during consumption individually), which may suggest pharyngeal residue and/or breathing/swallowing reciprocity concerns.  Pt educated re: slow, small bite/sip consumption due to breathing/swallowing pattern changes with dx of COPD and new PNA.  Pt stated he "sometimes gasps for air after swallowing" intermittently.  Pt with breathy, low intensity vocal quality during assessment, but instituting compensatory strategies and providing education re: swallowing/breathing pattern changes beneficial during PO intake with improved rate of intake and vocal quality.  Recommend continuing regular/thin liquid diet with ST f/u for diet tolerance/education while in acute setting.  Thank you for this consult. SLP Visit Diagnosis: Dysphagia, unspecified (R13.10)    Aspiration Risk  Mild aspiration risk    Diet Recommendation Regular;Thin liquid   Liquid Administration via: Cup;Straw Medication Administration: Whole meds with liquid Supervision: Patient able to self feed;Intermittent supervision to cue for compensatory strategies Compensations: Slow rate;Small sips/bites    Other  Recommendations Oral Care Recommendations: Oral care BID   Follow up Recommendations None      Frequency and Duration min 1 x/week  1 week       Prognosis Prognosis for Safe Diet Advancement: Good      Swallow Study   General Date of Onset: 12/29/19 HPI: 64 y.o. male with medical history significant for COPD and asthma with chronic respiratory failure on 3 - 4 L of oxygen, primary chondrosarcoma of the ribs, hypertension Type of Study: Bedside Swallow Evaluation Diet Prior to this Study: Thin liquids;Regular Temperature Spikes Noted: No Respiratory Status: Nasal cannula History of Recent Intubation:  No Behavior/Cognition: Alert;Cooperative Oral Cavity  Assessment: Within Functional Limits Oral Care Completed by SLP: Other (Comment) (Pt actively eating when SLP entered room) Oral Cavity - Dentition: Dentures, top;Dentures, bottom Vision: Functional for self-feeding Self-Feeding Abilities: Able to feed self Patient Positioning: Other (comment) (upright on side of bed) Baseline Vocal Quality: Low vocal intensity;Breathy Volitional Cough: Strong Volitional Swallow: Able to elicit    Oral/Motor/Sensory Function Overall Oral Motor/Sensory Function: Within functional limits   Ice Chips Ice chips: Not tested   Thin Liquid Thin Liquid: Impaired Presentation: Cup Pharyngeal  Phase Impairments: Cough - Delayed    Nectar Thick Nectar Thick Liquid: Not tested   Honey Thick Honey Thick Liquid: Not tested   Puree Puree: Not tested   Solid     Solid: Impaired Presentation: Self Fed Pharyngeal Phase Impairments: Cough - Delayed      Elvina Sidle, M.S., CCC-SLP 12/30/2019,10:11 AM

## 2019-12-30 NOTE — Progress Notes (Signed)
PROGRESS NOTE        PATIENT DETAILS Name: Randall Sawyer Age: 64 y.o. Sex: male Date of Birth: 30-Sep-1955 Admit Date: 12/29/2019 Admitting Physician Ejiroghene Arlyce Dice, MD LAG:TXMIWOE, Cammie Mcgee, MD  Brief Narrative: Patient is a 64 y.o. male with history of COPD on 3-4 L of oxygen at home, primary chondrosarcoma of the rib, prior history of tobacco use-presented with several days history of cough, shortness of breath-found to have acute on chronic hypoxic respiratory failure due to pneumonia triggering COPD exacerbation.  See below for further details  Significant events: 12/19>> admit for worsening hypoxemia due to COPD exacerbation/pneumonia  Significant studies: 12/19>> chest x-ray: New nonspecific patchy reticulonodular opacities at bilateral lung bases  Antimicrobial therapy: Rocephin: 12/19>> Zithromax: 12/20>>  Microbiology data: 12/19>> blood cultures:No growth  Procedures :  None  Consults: None  DVT Prophylaxis : enoxaparin (LOVENOX) injection 40 mg Start: 12/30/19 0600    Subjective: Feels somewhat better-continues to cough.   Assessment/Plan: Acute on chronic hypoxic respiratory failure due to pneumonia and COPD exacerbation: Improving slowly-moving air well this morning-some scattered wheezing-continue steroids/bronchodilators/antimicrobial therapy.  Encourage incentive spirometry-follow clinical course.  Chronic lower extremity edema: Claims somewhat worse-also acknowledges exertional dyspnea/orthopnea-although BNP normal-check echo, UA.  LFTs stable.  Stop oral Lasix-start IV Lasix and follow.  Check lower extremity Dopplers-May need compressive stockings at some point.  Hyponatremia: Appears hypervolemic-has 3+ pitting edema-starting IV Lasix-follow.  If worsens-further work-up will be initiated.  Chondrosarcoma-s/p right sixth rib resection: Followed by CT surgery have [facility].  Per notes available in care everywhere-no  evidence of metastatic disease.  Fall-deconditioning: Secondary to acute illness-await PT/OT eval to determine appropriate disposition  Obesity: Estimated body mass index is 35.73 kg/m as calculated from the following:   Height as of this encounter: 5\' 8"  (1.727 m).   Weight as of this encounter: 106.6 kg.   Diet: Diet Order            Diet Heart Room service appropriate? Yes; Fluid consistency: Thin  Diet effective now                  Code Status: Full code   Family Communication: Spoke with daughter in law Leafy Ro [phone])  over phone on 12/20  Disposition Plan: Status is: Inpatient  Remains inpatient appropriate because:Inpatient level of care appropriate due to severity of illness   Dispo: The patient is from: Home              Anticipated d/c is to: Home              Anticipated d/c date is: 2 days              Patient currently is not medically stable to d/c.    Barriers to Discharge: Pneumonia/COPD-significant edema-on IV Lasix/IV antibiotics-not stable to be transition to oral medications at this point.  Antimicrobial agents: Anti-infectives (From admission, onward)   Start     Dose/Rate Route Frequency Ordered Stop   12/30/19 1000  cefTRIAXone (ROCEPHIN) 2 g in sodium chloride 0.9 % 100 mL IVPB        2 g 200 mL/hr over 30 Minutes Intravenous Every 24 hours 12/29/19 2332 01/04/20 0959   12/30/19 1000  azithromycin (ZITHROMAX) 500 mg in sodium chloride 0.9 % 250 mL IVPB  500 mg 250 mL/hr over 60 Minutes Intravenous Every 24 hours 12/29/19 2332 01/04/20 0959       Time spent: 35 minutes-Greater than 50% of this time was spent in counseling, explanation of diagnosis, planning of further management, and coordination of care.  MEDICATIONS: Scheduled Meds: . enoxaparin (LOVENOX) injection  40 mg Subcutaneous Q24H  . furosemide  40 mg Intravenous Q12H  . guaiFENesin-dextromethorphan  10 mL Oral Q8H  . ipratropium-albuterol  3 mL Nebulization  Q6H  . methylPREDNISolone (SOLU-MEDROL) injection  40 mg Intravenous Q12H  . pantoprazole  40 mg Oral Daily   Continuous Infusions: . azithromycin    . cefTRIAXone (ROCEPHIN)  IV     PRN Meds:.acetaminophen **OR** acetaminophen, ipratropium-albuterol, ondansetron **OR** ondansetron (ZOFRAN) IV, polyethylene glycol   PHYSICAL EXAM: Vital signs: Vitals:   12/30/19 0214 12/30/19 0221 12/30/19 0617 12/30/19 0802  BP: (!) 121/39  (!) 143/48   Pulse: 98  100   Resp: 20  18   Temp: 98 F (36.7 C)  98.4 F (36.9 C)   TempSrc: Oral  Oral   SpO2: 94% 92% 100% 98%  Weight:      Height:       Filed Weights   12/29/19 2003  Weight: 106.6 kg   Body mass index is 35.73 kg/m.   Gen Exam:Alert awake-not in any distress HEENT:atraumatic, normocephalic Chest: Scattered rhonchi CVS:S1S2 regular Abdomen:soft non tender, non distended Extremities:+++ edema Neurology: Non focal Skin: no rash  I have personally reviewed following labs and imaging studies  LABORATORY DATA: CBC: Recent Labs  Lab 12/29/19 1310 12/30/19 0326  WBC 10.0 12.7*  HGB 10.1* 10.0*  HCT 31.5* 31.7*  MCV 106.4* 107.5*  PLT 307 867    Basic Metabolic Panel: Recent Labs  Lab 12/29/19 1310 12/30/19 0326  NA 130* 129*  K 3.8 4.2  CL 82* 81*  CO2 41* 37*  GLUCOSE 115* 137*  BUN 18 20  CREATININE 1.22 1.22  CALCIUM 8.8* 8.7*    GFR: Estimated Creatinine Clearance: 70.5 mL/min (by C-G formula based on SCr of 1.22 mg/dL).  Liver Function Tests: Recent Labs  Lab 12/29/19 1310  AST 16  ALT 17  ALKPHOS 125  BILITOT 0.7  PROT 8.3*  ALBUMIN 3.5   No results for input(s): LIPASE, AMYLASE in the last 168 hours. No results for input(s): AMMONIA in the last 168 hours.  Coagulation Profile: No results for input(s): INR, PROTIME in the last 168 hours.  Cardiac Enzymes: No results for input(s): CKTOTAL, CKMB, CKMBINDEX, TROPONINI in the last 168 hours.  BNP (last 3 results) No results for  input(s): PROBNP in the last 8760 hours.  Lipid Profile: No results for input(s): CHOL, HDL, LDLCALC, TRIG, CHOLHDL, LDLDIRECT in the last 72 hours.  Thyroid Function Tests: No results for input(s): TSH, T4TOTAL, FREET4, T3FREE, THYROIDAB in the last 72 hours.  Anemia Panel: No results for input(s): VITAMINB12, FOLATE, FERRITIN, TIBC, IRON, RETICCTPCT in the last 72 hours.  Urine analysis: No results found for: COLORURINE, APPEARANCEUR, LABSPEC, PHURINE, GLUCOSEU, HGBUR, BILIRUBINUR, KETONESUR, PROTEINUR, UROBILINOGEN, NITRITE, LEUKOCYTESUR  Sepsis Labs: Lactic Acid, Venous    Component Value Date/Time   LATICACIDVEN 0.8 12/29/2019 1315    MICROBIOLOGY: Recent Results (from the past 240 hour(s))  Resp Panel by RT-PCR (Flu A&B, Covid) Nasopharyngeal Swab     Status: None   Collection Time: 12/29/19  1:03 PM   Specimen: Nasopharyngeal Swab; Nasopharyngeal(NP) swabs in vial transport medium  Result Value Ref Range Status  SARS Coronavirus 2 by RT PCR NEGATIVE NEGATIVE Final    Comment: (NOTE) SARS-CoV-2 target nucleic acids are NOT DETECTED.  The SARS-CoV-2 RNA is generally detectable in upper respiratory specimens during the acute phase of infection. The lowest concentration of SARS-CoV-2 viral copies this assay can detect is 138 copies/mL. A negative result does not preclude SARS-Cov-2 infection and should not be used as the sole basis for treatment or other patient management decisions. A negative result may occur with  improper specimen collection/handling, submission of specimen other than nasopharyngeal swab, presence of viral mutation(s) within the areas targeted by this assay, and inadequate number of viral copies(<138 copies/mL). A negative result must be combined with clinical observations, patient history, and epidemiological information. The expected result is Negative.  Fact Sheet for Patients:  EntrepreneurPulse.com.au  Fact Sheet for  Healthcare Providers:  IncredibleEmployment.be  This test is no t yet approved or cleared by the [place] FDA and  has been authorized for detection and/or diagnosis of SARS-CoV-2 by FDA under an Emergency Use Authorization (EUA). This EUA will remain  in effect (meaning this test can be used) for the duration of the COVID-19 declaration under Section 564(b)(1) of the Act, 21 U.S.C.section 360bbb-3(b)(1), unless the authorization is terminated  or revoked sooner.       Influenza A by PCR NEGATIVE NEGATIVE Final   Influenza B by PCR NEGATIVE NEGATIVE Final    Comment: (NOTE) The Xpert Xpress SARS-CoV-2/FLU/RSV plus assay is intended as an aid in the diagnosis of influenza from Nasopharyngeal swab specimens and should not be used as a sole basis for treatment. Nasal washings and aspirates are unacceptable for Xpert Xpress SARS-CoV-2/FLU/RSV testing.  Fact Sheet for Patients: EntrepreneurPulse.com.au  Fact Sheet for Healthcare Providers: IncredibleEmployment.be  This test is not yet approved or cleared by the [place] FDA and has been authorized for detection and/or diagnosis of SARS-CoV-2 by FDA under an Emergency Use Authorization (EUA). This EUA will remain in effect (meaning this test can be used) for the duration of the COVID-19 declaration under Section 564(b)(1) of the Act, 21 U.S.C. section 360bbb-3(b)(1), unless the authorization is terminated or revoked.  Performed at [facility], [address], [place], [place] [address]   Blood culture (routine x 2)     Status: None (Preliminary result)   Collection Time: 12/29/19  1:16 PM   Specimen: BLOOD  Result Value Ref Range Status   Specimen Description BLOOD BLOOD RIGHT ARM  Final   Special Requests   Final    BOTTLES DRAWN AEROBIC AND ANAEROBIC Blood Culture adequate volume   Culture   Final    NO GROWTH < 24 HOURS Performed at [facility], [address] [place], [place] [address]    Report Status PENDING  Incomplete  Blood culture (routine x 2)     Status: None (Preliminary result)   Collection Time: 12/29/19  3:20 PM   Specimen: BLOOD  Result Value Ref Range Status   Specimen Description BLOOD BLOOD LEFT ARM  Final   Special Requests   Final    BOTTLES DRAWN AEROBIC AND ANAEROBIC Blood Culture adequate volume   Culture   Final    NO GROWTH < 24 HOURS Performed at [facility], [address], [place], [place] [address]    Report Status PENDING  Incomplete    RADIOLOGY STUDIES/RESULTS: DG Chest Port 1 View  Result Date: 12/29/2019 CLINICAL DATA:  Dyspnea, COPD, history of right sixth rib chondrosarcoma, former smoker EXAM: PORTABLE CHEST  1 VIEW COMPARISON:  03/02/2017 chest radiograph. FINDINGS: Stable cardiomediastinal silhouette with normal heart size. No pneumothorax. Patchy reticulonodular opacities at both lung bases are new. No overt pulmonary edema. No acute consolidative airspace disease. No pleural effusions. IMPRESSION: New nonspecific patchy reticulonodular opacities at both lung bases, differential includes aspiration, bronchopneumonia or fibrosis. Follow-up chest radiographs advised. Electronically Signed   By: Ilona Sorrel M.D.   On: 12/29/2019 13:59     LOS: 1 day   Oren Binet, MD  [facility]    To contact the attending provider between 7A-7P or the covering provider during after hours 7P-7A, please log into the web site [email or web address] and access using universal [place] password for that web site. If you do not have the password, please call the hospital operator.  12/30/2019, 10:05 AM

## 2019-12-31 LAB — CBC
HCT: 30 % — ABNORMAL LOW (ref 39.0–52.0)
Hemoglobin: 9.6 g/dL — ABNORMAL LOW (ref 13.0–17.0)
MCH: 34.5 pg — ABNORMAL HIGH (ref 26.0–34.0)
MCHC: 32 g/dL (ref 30.0–36.0)
MCV: 107.9 fL — ABNORMAL HIGH (ref 80.0–100.0)
Platelets: 306 10*3/uL (ref 150–400)
RBC: 2.78 MIL/uL — ABNORMAL LOW (ref 4.22–5.81)
RDW: 18.7 % — ABNORMAL HIGH (ref 11.5–15.5)
WBC: 12.9 10*3/uL — ABNORMAL HIGH (ref 4.0–10.5)
nRBC: 0.7 % — ABNORMAL HIGH (ref 0.0–0.2)

## 2019-12-31 LAB — BASIC METABOLIC PANEL
Anion gap: 9 (ref 5–15)
BUN: 32 mg/dL — ABNORMAL HIGH (ref 8–23)
CO2: 42 mmol/L — ABNORMAL HIGH (ref 22–32)
Calcium: 8.8 mg/dL — ABNORMAL LOW (ref 8.9–10.3)
Chloride: 81 mmol/L — ABNORMAL LOW (ref 98–111)
Creatinine, Ser: 1.42 mg/dL — ABNORMAL HIGH (ref 0.61–1.24)
GFR, Estimated: 54 mL/min — ABNORMAL LOW (ref >60)
Glucose, Bld: 131 mg/dL — ABNORMAL HIGH (ref 70–99)
Potassium: 4.2 mmol/L (ref 3.5–5.1)
Sodium: 132 mmol/L — ABNORMAL LOW (ref 135–145)

## 2019-12-31 MED ORDER — FUROSEMIDE 40 MG PO TABS
40.0000 mg | ORAL_TABLET | Freq: Every day | ORAL | Status: DC
  Administered 2020-01-01 – 2020-01-02 (×2): 40 mg via ORAL
  Filled 2019-12-31 (×2): qty 1

## 2019-12-31 MED ORDER — IPRATROPIUM-ALBUTEROL 0.5-2.5 (3) MG/3ML IN SOLN
3.0000 mL | Freq: Three times a day (TID) | RESPIRATORY_TRACT | Status: DC
  Administered 2019-12-31 – 2020-01-01 (×2): 3 mL via RESPIRATORY_TRACT
  Filled 2019-12-31 (×3): qty 3

## 2019-12-31 MED ORDER — AZITHROMYCIN 250 MG PO TABS
500.0000 mg | ORAL_TABLET | Freq: Every day | ORAL | Status: AC
  Administered 2020-01-01: 08:00:00 500 mg via ORAL
  Filled 2019-12-31: qty 2

## 2019-12-31 MED ORDER — SPIRONOLACTONE 25 MG PO TABS
25.0000 mg | ORAL_TABLET | Freq: Every day | ORAL | Status: DC
  Administered 2020-01-01 – 2020-01-02 (×2): 25 mg via ORAL
  Filled 2019-12-31 (×2): qty 1

## 2019-12-31 MED ORDER — PREDNISONE 20 MG PO TABS
40.0000 mg | ORAL_TABLET | Freq: Every day | ORAL | Status: DC
  Administered 2020-01-01: 08:00:00 40 mg via ORAL
  Filled 2019-12-31: qty 2

## 2019-12-31 NOTE — Progress Notes (Signed)
PROGRESS NOTE        PATIENT DETAILS Name: Randall Sawyer Age: 64 y.o. Sex: male Date of Birth: [birth year] Admit Date: 12/29/2019 Admitting Physician Ejiroghene Arlyce Dice, MD [id number], Cammie Mcgee, MD  Brief Narrative: Patient is a 64 y.o. male with history of COPD on 3-4 L of oxygen at home, primary chondrosarcoma,  of the rib, prior history of tobacco use,chronic bilateral lower extremity edema-presented with several days history of cough, shortness of breath-found to have acute on chronic hypoxic respiratory failure due to pneumonia triggering COPD exacerbation.  See below for further details  Significant events: 12/19>> admit for worsening hypoxemia due to COPD exacerbation/pneumonia  Significant studies: 12/19>> chest x-ray: New nonspecific patchy reticulonodular opacities at bilateral lung bases 12/20>> Echo: EF 60-65% 12/20>> bilateral lower extremity Dopplers: No DVT  Antimicrobial therapy: Rocephin: 12/19>> Zithromax: 12/20>>  Microbiology data: 12/19>> blood cultures:No growth  Procedures :  None  Consults: None  DVT Prophylaxis : Place TED hose Start: 12/30/19 1547 enoxaparin (LOVENOX) injection 40 mg Start: 12/30/19 0600    Subjective: Feels much better this morning-much more steady on his feet.  Breathing is significantly better per patient.  Continues to have lower extremity edema.   Assessment/Plan: Acute on chronic hypoxic respiratory failure due to pneumonia and COPD exacerbation: Rapidly improving-moving air-hardly any rhonchi this morning-switch to prednisone-continue bronchodilators and antimicrobial therapy.  Cultures negative so far.  Continue to encourage incentive spirometry.  Suspect that if clinical improvement continues-he probably will be discharged home on 12/22.  AKI: Hemodynamically mediated-due to diuretic use-stopping IV Lasix-transitioning to oral diuretic regimen-recheck electrolytes tomorrow  morning.  Hyponatremia: Secondary to hypervolemia-and excessive free water intake.  Sodium levels much better after initiating IV diuretics-continue fluid restriction-switch to oral diuretics today-recheck electrolytes tomorrow.  Chronic lower extremity edema: Although chronic-apparently much worse over the past few weeks.  UA without proteinuria-LFTs stable-echo without any evidence of CHF.  He may have chronic venous stasis at baseline causing edema.  Stop IV Lasix due to mild jump in creatinine-switch to oral Lasix-have asked nurse to apply compressive stockings.  Reassess tomorrow.   Chondrosarcoma-s/p right sixth rib resection: Followed by CT surgery have [facility].  Per notes available in care everywhere-no evidence of metastatic disease.  Fall x2 on day of admit-deconditioning: Secondary to acute illness-seems much more steady on his feet this morning compared to just yesterday.  Awaiting PT/OT.  Obesity: Estimated body mass index is 35.73 kg/m as calculated from the following:   Height as of this encounter: 5\' 8"  (1.727 m).   Weight as of this encounter: 106.6 kg.   Diet: Diet Order            Diet Heart Room service appropriate? Yes; Fluid consistency: Thin; Fluid restriction: 1500 mL Fluid  Diet effective now                  Code Status: Full code   Family Communication: Spoke with daughter in law Leafy Ro [phone])  over phone on 12/21  Disposition Plan: Status is: Inpatient  Remains inpatient appropriate because:Inpatient level of care appropriate due to severity of illness  Dispo: The patient is from: Home              Anticipated d/c is to: Home              Anticipated d/c  date is: 1 days              Patient currently is not medically stable to d/c.   Barriers to Discharge: Pneumonia/COPD-significant edema-mild AKI-repeat labs on 12/22-if stable likely discharge.  Antimicrobial agents: Anti-infectives (From admission, onward)   Start     Dose/Rate Route  Frequency Ordered Stop   12/30/19 1000  cefTRIAXone (ROCEPHIN) 2 g in sodium chloride 0.9 % 100 mL IVPB        2 g 200 mL/hr over 30 Minutes Intravenous Every 24 hours 12/29/19 2332 01/04/20 0959   12/30/19 1000  azithromycin (ZITHROMAX) 500 mg in sodium chloride 0.9 % 250 mL IVPB        500 mg 250 mL/hr over 60 Minutes Intravenous Every 24 hours 12/29/19 2332 01/04/20 0959       Time spent: 25 minutes-Greater than 50% of this time was spent in counseling, explanation of diagnosis, planning of further management, and coordination of care.  MEDICATIONS: Scheduled Meds: . enoxaparin (LOVENOX) injection  40 mg Subcutaneous Q24H  . furosemide  40 mg Oral Daily  . guaiFENesin-dextromethorphan  10 mL Oral Q8H  . ipratropium-albuterol  3 mL Nebulization TID  . pantoprazole  40 mg Oral Daily  . predniSONE  40 mg Oral Q breakfast  . spironolactone  25 mg Oral Daily   Continuous Infusions: . azithromycin 500 mg (12/30/19 1020)  . cefTRIAXone (ROCEPHIN)  IV 2 g (12/30/19 1021)   PRN Meds:.acetaminophen **OR** acetaminophen, ipratropium-albuterol, ondansetron **OR** ondansetron (ZOFRAN) IV, polyethylene glycol   PHYSICAL EXAM: Vital signs: Vitals:   12/30/19 2110 12/31/19 0141 12/31/19 0419 12/31/19 0745  BP: (!) 120/59  (!) 143/66   Pulse: 90  88   Resp: 18  19   Temp: 98.1 F (36.7 C)  98.2 F (36.8 C)   TempSrc:      SpO2: 92% 91% 93% 94%  Weight:      Height:       Filed Weights   12/29/19 2003  Weight: 106.6 kg   Body mass index is 35.73 kg/m.  Gen Exam:Alert awake-not in any distress HEENT:atraumatic, normocephalic Chest: B/L clear to auscultation anteriorly CVS:S1S2 regular Abdomen:soft non tender, non distended Extremities:++ edema Neurology: Non focal Skin: no rash  I have personally reviewed following labs and imaging studies  LABORATORY DATA: CBC: Recent Labs  Lab 12/29/19 1310 12/30/19 0326 12/31/19 0734  WBC 10.0 12.7* 12.9*  HGB 10.1* 10.0*  9.6*  HCT 31.5* 31.7* 30.0*  MCV 106.4* 107.5* 107.9*  PLT 307 351 270    Basic Metabolic Panel: Recent Labs  Lab 12/29/19 1310 12/30/19 0326 12/31/19 0734  NA 130* 129* 132*  K 3.8 4.2 4.2  CL 82* 81* 81*  CO2 41* 37* 42*  GLUCOSE 115* 137* 131*  BUN 18 20 32*  CREATININE 1.22 1.22 1.42*  CALCIUM 8.8* 8.7* 8.8*    GFR: Estimated Creatinine Clearance: 60.6 mL/min (A) (by C-G formula based on SCr of 1.42 mg/dL (H)).  Liver Function Tests: Recent Labs  Lab 12/29/19 1310  AST 16  ALT 17  ALKPHOS 125  BILITOT 0.7  PROT 8.3*  ALBUMIN 3.5   No results for input(s): LIPASE, AMYLASE in the last 168 hours. No results for input(s): AMMONIA in the last 168 hours.  Coagulation Profile: No results for input(s): INR, PROTIME in the last 168 hours.  Cardiac Enzymes: No results for input(s): CKTOTAL, CKMB, CKMBINDEX, TROPONINI in the last 168 hours.  BNP (last 3 results) No results  for input(s): PROBNP in the last 8760 hours.  Lipid Profile: No results for input(s): CHOL, HDL, LDLCALC, TRIG, CHOLHDL, LDLDIRECT in the last 72 hours.  Thyroid Function Tests: No results for input(s): TSH, T4TOTAL, FREET4, T3FREE, THYROIDAB in the last 72 hours.  Anemia Panel: No results for input(s): VITAMINB12, FOLATE, FERRITIN, TIBC, IRON, RETICCTPCT in the last 72 hours.  Urine analysis:    Component Value Date/Time   COLORURINE YELLOW 12/30/2019 1011   APPEARANCEUR CLEAR 12/30/2019 1011   LABSPEC 1.012 12/30/2019 1011   PHURINE 5.0 12/30/2019 1011   GLUCOSEU NEGATIVE 12/30/2019 1011   HGBUR SMALL (A) 12/30/2019 1011   BILIRUBINUR NEGATIVE 12/30/2019 [place] 12/30/2019 1011   PROTEINUR NEGATIVE 12/30/2019 1011   NITRITE NEGATIVE 12/30/2019 [place] 12/30/2019 1011    Sepsis Labs: Lactic Acid, Venous    Component Value Date/Time   LATICACIDVEN 0.8 12/29/2019 1315    MICROBIOLOGY: Recent Results (from the past 240 hour(s))  Resp  Panel by RT-PCR (Flu A&B, Covid) Nasopharyngeal Swab     Status: None   Collection Time: 12/29/19  1:03 PM   Specimen: Nasopharyngeal Swab; Nasopharyngeal(NP) swabs in vial transport medium  Result Value Ref Range Status   SARS Coronavirus 2 by RT PCR NEGATIVE NEGATIVE Final    Comment: (NOTE) SARS-CoV-2 target nucleic acids are NOT DETECTED.  The SARS-CoV-2 RNA is generally detectable in upper respiratory specimens during the acute phase of infection. The lowest concentration of SARS-CoV-2 viral copies this assay can detect is 138 copies/mL. A negative result does not preclude SARS-Cov-2 infection and should not be used as the sole basis for treatment or other patient management decisions. A negative result may occur with  improper specimen collection/handling, submission of specimen other than nasopharyngeal swab, presence of viral mutation(s) within the areas targeted by this assay, and inadequate number of viral copies(<138 copies/mL). A negative result must be combined with clinical observations, patient history, and epidemiological information. The expected result is Negative.  Fact Sheet for Patients:  EntrepreneurPulse.com.au  Fact Sheet for Healthcare Providers:  IncredibleEmployment.be  This test is no t yet approved or cleared by the [place] FDA and  has been authorized for detection and/or diagnosis of SARS-CoV-2 by FDA under an Emergency Use Authorization (EUA). This EUA will remain  in effect (meaning this test can be used) for the duration of the COVID-19 declaration under Section 564(b)(1) of the Act, 21 U.S.C.section 360bbb-3(b)(1), unless the authorization is terminated  or revoked sooner.       Influenza A by PCR NEGATIVE NEGATIVE Final   Influenza B by PCR NEGATIVE NEGATIVE Final    Comment: (NOTE) The Xpert Xpress SARS-CoV-2/FLU/RSV plus assay is intended as an aid in the diagnosis of influenza from Nasopharyngeal  swab specimens and should not be used as a sole basis for treatment. Nasal washings and aspirates are unacceptable for Xpert Xpress SARS-CoV-2/FLU/RSV testing.  Fact Sheet for Patients: EntrepreneurPulse.com.au  Fact Sheet for Healthcare Providers: IncredibleEmployment.be  This test is not yet approved or cleared by the [place] FDA and has been authorized for detection and/or diagnosis of SARS-CoV-2 by FDA under an Emergency Use Authorization (EUA). This EUA will remain in effect (meaning this test can be used) for the duration of the COVID-19 declaration under Section 564(b)(1) of the Act, 21 U.S.C. section 360bbb-3(b)(1), unless the authorization is terminated or revoked.  Performed at [facility], [address] [place], [place] [address]   Blood culture (routine x 2)  Status: None (Preliminary result)   Collection Time: 12/29/19  1:16 PM   Specimen: BLOOD  Result Value Ref Range Status   Specimen Description BLOOD BLOOD RIGHT ARM  Final   Special Requests   Final    BOTTLES DRAWN AEROBIC AND ANAEROBIC Blood Culture adequate volume   Culture   Final    NO GROWTH < 24 HOURS Performed at [facility], [address], [place], [place] [address]    Report Status PENDING  Incomplete  Blood culture (routine x 2)     Status: None (Preliminary result)   Collection Time: 12/29/19  3:20 PM   Specimen: BLOOD  Result Value Ref Range Status   Specimen Description BLOOD BLOOD LEFT ARM  Final   Special Requests   Final    BOTTLES DRAWN AEROBIC AND ANAEROBIC Blood Culture adequate volume   Culture   Final    NO GROWTH < 24 HOURS Performed at [facility], [address] [place], [place] [address]    Report Status PENDING  Incomplete    RADIOLOGY STUDIES/RESULTS: US Venous Img Lower Bilateral (DVT)  Result Date: 12/30/2019 CLINICAL DATA:  Bilateral lower extremity edema EXAM: BILATERAL LOWER EXTREMITY VENOUS DOPPLER ULTRASOUND  TECHNIQUE: Gray-scale sonography with graded compression, as well as color Doppler and duplex ultrasound were performed to evaluate the lower extremity deep venous systems from the level of the common femoral vein and including the common femoral, femoral, profunda femoral, popliteal and calf veins including the posterior tibial, peroneal and gastrocnemius veins when visible. The superficial great saphenous vein was also interrogated. Spectral Doppler was utilized to evaluate flow at rest and with distal augmentation maneuvers in the common femoral, femoral and popliteal veins. COMPARISON:  None. FINDINGS: RIGHT LOWER EXTREMITY Common Femoral Vein: No evidence of thrombus. Normal compressibility, respiratory phasicity and response to augmentation. Saphenofemoral Junction: No evidence of thrombus. Normal compressibility and flow on color Doppler imaging. Profunda Femoral Vein: No evidence of thrombus. Normal compressibility and flow on color Doppler imaging. Femoral Vein: No evidence of thrombus. Normal compressibility, respiratory phasicity and response to augmentation. Popliteal Vein: No evidence of thrombus. Normal compressibility, respiratory phasicity and response to augmentation. Calf Veins: Calf veins are not well visualized due to underlying soft tissue edema. Superficial Great Saphenous Vein: No evidence of thrombus. Normal compressibility. Venous Reflux:  None. Other Findings:  None. LEFT LOWER EXTREMITY Common Femoral Vein: No evidence of thrombus. Normal compressibility, respiratory phasicity and response to augmentation. Saphenofemoral Junction: No evidence of thrombus. Normal compressibility and flow on color Doppler imaging. Profunda Femoral Vein: No evidence of thrombus. Normal compressibility and flow on color Doppler imaging. Femoral Vein: No evidence of thrombus. Normal compressibility, respiratory phasicity and response to augmentation. Popliteal Vein: No evidence of thrombus. Normal  compressibility, respiratory phasicity and response to augmentation. Calf Veins: Calf veins are not well visualized due to underlying soft tissue edema. Superficial Great Saphenous Vein: No evidence of thrombus. Normal compressibility. Venous Reflux:  None. Other Findings:  None. IMPRESSION: No definitive deep venous thrombosis noted. Calf edema bilaterally. Electronically Signed   By: Inez Catalina M.D.   On: 12/30/2019 11:43   DG Chest Port 1 View  Result Date: 12/29/2019 CLINICAL DATA:  Dyspnea, COPD, history of right sixth rib chondrosarcoma, former smoker EXAM: PORTABLE CHEST 1 VIEW COMPARISON:  03/02/2017 chest radiograph. FINDINGS: Stable cardiomediastinal silhouette with normal heart size. No pneumothorax. Patchy reticulonodular opacities at both lung bases are new. No overt pulmonary edema. No acute consolidative airspace disease. No pleural effusions.  IMPRESSION: New nonspecific patchy reticulonodular opacities at both lung bases, differential includes aspiration, bronchopneumonia or fibrosis. Follow-up chest radiographs advised. Electronically Signed   By: Ilona Sorrel M.D.   On: 12/29/2019 13:59   ECHOCARDIOGRAM COMPLETE  Result Date: 12/30/2019    ECHOCARDIOGRAM REPORT   Patient Name:   ZEDDIE NJIE Date of Exam: 12/30/2019 Medical Rec #:  [id number]         Height:       68.0 in Accession #:    [id number]        Weight:       235.0 lb Date of Birth:  May 23, 1955        BSA:          2.189 m Patient Age:    57 years          BP:           141/59 mmHg Patient Gender: M                 HR:           95 bpm. Exam Location:  [facility] Procedure: 2D Echo Indications:    Congestive Heart Failure I50.9  History:        Patient has prior history of Echocardiogram examinations, most                 recent 01/24/2014. COPD; Risk Factors:Former Smoker. Pneumonia.  Sonographer:    Leavy Cella RDCS (AE) Referring Phys: [place]  1. Left ventricular ejection fraction, by  estimation, is 60 to 65%. The left ventricle has normal function. The left ventricle has no regional wall motion abnormalities. There is mild left ventricular hypertrophy. Left ventricular diastolic parameters were normal.  2. Right ventricular systolic function is normal. The right ventricular size is normal.  3. Left atrial size was mildly dilated.  4. The mitral valve was not well visualized. Trivial mitral valve regurgitation. No evidence of mitral stenosis.  5. The aortic valve was not well visualized. Aortic valve regurgitation is not visualized. No aortic stenosis is present.  6. The inferior vena cava is normal in size with <50% respiratory variability, suggesting right atrial pressure of 8 mmHg. FINDINGS  Left Ventricle: Left ventricular ejection fraction, by estimation, is 60 to 65%. The left ventricle has normal function. The left ventricle has no regional wall motion abnormalities. The left ventricular internal cavity size was normal in size. There is  mild left ventricular hypertrophy. Left ventricular diastolic parameters were normal. Right Ventricle: The right ventricular size is normal. No increase in right ventricular wall thickness. Right ventricular systolic function is normal. Left Atrium: Left atrial size was mildly dilated. Right Atrium: Right atrial size was normal in size. Pericardium: There is no evidence of pericardial effusion. Mitral Valve: The mitral valve was not well visualized. Trivial mitral valve regurgitation. No evidence of mitral valve stenosis. Tricuspid Valve: The tricuspid valve is normal in structure. Tricuspid valve regurgitation is trivial. No evidence of tricuspid stenosis. Aortic Valve: The aortic valve was not well visualized. Aortic valve regurgitation is not visualized. No aortic stenosis is present. Pulmonic Valve: The pulmonic valve was normal in structure. Pulmonic valve regurgitation is not visualized. No evidence of pulmonic stenosis. Aorta: The aortic root is  normal in size and structure. Venous: The inferior vena cava is normal in size with less than 50% respiratory variability, suggesting right atrial pressure of 8 mmHg. IAS/Shunts: No atrial level shunt detected by color flow  Doppler.  LEFT VENTRICLE PLAX 2D LVIDd:         4.83 cm  Diastology LVIDs:         3.27 cm  LV e' medial:    8.70 cm/s LV PW:         1.30 cm  LV E/e' medial:  16.2 LV IVS:        1.22 cm  LV e' lateral:   10.90 cm/s LVOT diam:     2.00 cm  LV E/e' lateral: 12.9 LVOT Area:     3.14 cm  RIGHT VENTRICLE RV S prime:     17.40 cm/s TAPSE (M-mode): 2.4 cm LEFT ATRIUM             Index       RIGHT ATRIUM          Index LA diam:        3.80 cm 1.74 cm/m  RA Area:     9.99 cm LA Vol (A2C):   32.6 ml 14.89 ml/m RA Volume:   19.30 ml 8.82 ml/m LA Vol (A4C):   42.7 ml 19.51 ml/m LA Biplane Vol: 36.6 ml 16.72 ml/m   AORTA Ao Root diam: 2.30 cm MITRAL VALVE MV Area (PHT): 4.52 cm     SHUNTS MV Decel Time: 168 msec     Systemic Diam: 2.00 cm MV E velocity: 141.00 cm/s MV A velocity: 108.00 cm/s MV E/A ratio:  1.31 Jenkins Rouge MD Electronically signed by Jenkins Rouge MD Signature Date/Time: 12/30/2019/3:52:42 PM    Final      LOS: 2 days   Oren Binet, MD  [facility]    To contact the attending provider between 7A-7P or the covering provider during after hours 7P-7A, please log into the web site [email or web address] and access using universal [place]  password for that web site. If you do not have the password, please call the hospital operator.  12/31/2019, 8:48 AM

## 2019-12-31 NOTE — Evaluation (Signed)
Physical Therapy Evaluation Patient Details Name: Randall Sawyer MRN: [id number] DOB: 19-Jan-1955 Today's Date: 12/31/2019   History of Present Illness  Patient presented to the ED with complaints of increasing difficulty breathing that started on Friday 2 days ago.  Reports productive cough.  No chest pain.  He has chronic and unchanged bilateral lower extremity swelling.    Clinical Impression  Patient functioning near baseline for functional mobility and gait, other than SpO2 presenting at 77% secondary to patient not having canula in his nostrils.  Patient put back on 2 LPM with SpO2 increasing to 95% while seated in chair, ambulated in room/hallway without loss of balance, SpO2 dropped from 95% to 88% during ambulation, increased above 90% while resting in chair and patient advised to be aware of his canula and wear at all times to avoid becoming hypoxic - RN notified.  Plan:  Patient discharged from physical therapy to care of nursing for ambulation daily as tolerated for length of stay.     Follow Up Recommendations No PT follow up    Equipment Recommendations  None recommended by PT    Recommendations for Other Services       Precautions / Restrictions Precautions Precautions: None Restrictions Weight Bearing Restrictions: No      Mobility  Bed Mobility Overal bed mobility: Independent                  Transfers Overall transfer level: Independent                  Ambulation/Gait Ambulation/Gait assistance: Modified independent (Device/Increase time) Gait Distance (Feet): 75 Feet Assistive device: None Gait Pattern/deviations: WFL(Within Functional Limits);Drifts right/left Gait velocity: decreased   General Gait Details: grossly WFL, slightly labored cadence with occasional drifting right/left able to self correct without loss of balance, on 2 LPM O2 with SpO2 dropping from 95% to 88%  Stairs            Wheelchair Mobility    Modified  Rankin (Stroke Patients Only)       Balance Overall balance assessment: Mild deficits observed, not formally tested                                           Pertinent Vitals/Pain Pain Assessment: No/denies pain    Home Living Family/patient expects to be discharged to:: Private residence Living Arrangements: Alone Available Help at Discharge: Family Type of Home: Mobile home Home Access: Ramped entrance     Home Layout: One level Home Equipment: None      Prior Function Level of Independence: Independent         Comments: pt reports independence in mobility and ADLs, drives, community ambulator     Hand Dominance   Dominant Hand: Right    Extremity/Trunk Assessment   Upper Extremity Assessment Upper Extremity Assessment: Defer to OT evaluation    Lower Extremity Assessment Lower Extremity Assessment: Overall WFL for tasks assessed    Cervical / Trunk Assessment Cervical / Trunk Assessment: Normal  Communication   Communication: No difficulties  Cognition Arousal/Alertness: Awake/alert Behavior During Therapy: WFL for tasks assessed/performed Overall Cognitive Status: Within Functional Limits for tasks assessed  General Comments      Exercises     Assessment/Plan    PT Assessment Patent does not need any further PT services  PT Problem List         PT Treatment Interventions      PT Goals (Current goals can be found in the Care Plan section)  Acute Rehab PT Goals Patient Stated Goal: return home with family to assist PT Goal Formulation: With patient Time For Goal Achievement: 12/31/19 Potential to Achieve Goals: Good    Frequency     Barriers to discharge        Co-evaluation               AM-PAC PT "6 Clicks" Mobility  Outcome Measure Help needed turning from your back to your side while in a flat bed without using bedrails?: None Help needed moving  from lying on your back to sitting on the side of a flat bed without using bedrails?: None Help needed moving to and from a bed to a chair (including a wheelchair)?: None Help needed standing up from a chair using your arms (e.g., wheelchair or bedside chair)?: None Help needed to walk in hospital room?: None Help needed climbing 3-5 steps with a railing? : None 6 Click Score: 24    End of Session Equipment Utilized During Treatment: Oxygen Activity Tolerance: Patient tolerated treatment well Patient left: in chair;with call bell/phone within reach Nurse Communication: Mobility status PT Visit Diagnosis: Unsteadiness on feet (R26.81);Other abnormalities of gait and mobility (R26.89);Muscle weakness (generalized) (M62.81)    Time: 0300-9233 PT Time Calculation (min) (ACUTE ONLY): 26 min   Charges:   PT Evaluation $PT Eval Moderate Complexity: 1 Mod PT Treatments $Therapeutic Activity: 23-37 mins        9:36 AM, 12/31/19 Lonell Grandchild, MPT Physical Therapist with [facility] 336 [phone] office [phone] mobile phone

## 2019-12-31 NOTE — Evaluation (Signed)
Occupational Therapy Evaluation Patient Details Name: Randall Sawyer MRN: [id number] DOB: [birth year] Today's Date: 12/31/2019    History of Present Illness Patient presented to the ED with complaints of increasing difficulty breathing that started on Friday 2 days ago.  Reports productive cough.  No chest pain.  He has chronic and unchanged bilateral lower extremity swelling.   Clinical Impression   Pt performing mobility and ADLs independently, demonstrates BUE strength 5/5. Pt reports falls at home, unsure of the cause but feels as if he will pass out before falling. Pt with no LOB or dizziness during session. Discussed safe transfers and mobility techniques during ADLs, pt verbalized understanding. No further OT services required at this time.     Follow Up Recommendations  No OT follow up    Equipment Recommendations  None recommended by OT       Precautions / Restrictions Precautions Precautions: None Restrictions Weight Bearing Restrictions: No      Mobility Bed Mobility Overal bed mobility: Independent                  Transfers Overall transfer level: Independent                        ADL either performed or assessed with clinical judgement   ADL Overall ADL's : Modified independent;At baseline                                       General ADL Comments: pt performing ADLs independently     Vision Baseline Vision/History: No visual deficits Patient Visual Report: No change from baseline Vision Assessment?: Yes Eye Alignment: Within Functional Limits Alignment/Gaze Preference: Within Defined Limits Tracking/Visual Pursuits: Able to track stimulus in all quads without difficulty Saccades: Within functional limits Convergence: Within functional limits Visual Fields: No apparent deficits            Pertinent Vitals/Pain Pain Assessment: No/denies pain     Hand Dominance Right   Extremity/Trunk Assessment Upper  Extremity Assessment Upper Extremity Assessment: Overall WFL for tasks assessed (BUE strength 5/5)   Lower Extremity Assessment Lower Extremity Assessment: Defer to PT evaluation   Cervical / Trunk Assessment Cervical / Trunk Assessment: Normal   Communication Communication Communication: No difficulties   Cognition Arousal/Alertness: Awake/alert Behavior During Therapy: WFL for tasks assessed/performed Overall Cognitive Status: Within Functional Limits for tasks assessed                                                Home Living Family/patient expects to be discharged to:: Private residence Living Arrangements: Alone   Type of Home: Mobile home Home Access: Ramped entrance     Home Layout: One level     Bathroom Shower/Tub: Teacher, early years/pre: Standard     Home Equipment: None          Prior Functioning/Environment Level of Independence: Independent        Comments: pt reports independence in mobility and ADLs, drives        OT Problem List: Decreased activity tolerance;Cardiopulmonary status limiting activity       End of Session Equipment Utilized During Treatment: Oxygen  Activity Tolerance: Patient tolerated treatment well Patient left: in chair;with call bell/phone  within reach  OT Visit Diagnosis: History of falling (Z91.81);Muscle weakness (generalized) (M62.81)                Time: 6950-7225 OT Time Calculation (min): 13 min Charges:  OT General Charges $OT Visit: 1 Visit OT Evaluation $OT Eval Low Complexity: [place], OTR/L  [phone] 12/31/2019, 8:55 AM

## 2020-01-01 LAB — CBC
HCT: 32.7 % — ABNORMAL LOW (ref 39.0–52.0)
Hemoglobin: 10.2 g/dL — ABNORMAL LOW (ref 13.0–17.0)
MCH: 33.8 pg (ref 26.0–34.0)
MCHC: 31.2 g/dL (ref 30.0–36.0)
MCV: 108.3 fL — ABNORMAL HIGH (ref 80.0–100.0)
Platelets: 333 10*3/uL (ref 150–400)
RBC: 3.02 MIL/uL — ABNORMAL LOW (ref 4.22–5.81)
RDW: 18.9 % — ABNORMAL HIGH (ref 11.5–15.5)
WBC: 13.9 10*3/uL — ABNORMAL HIGH (ref 4.0–10.5)
nRBC: 1.1 % — ABNORMAL HIGH (ref 0.0–0.2)

## 2020-01-01 LAB — BASIC METABOLIC PANEL
Anion gap: 9 (ref 5–15)
BUN: 32 mg/dL — ABNORMAL HIGH (ref 8–23)
CO2: 42 mmol/L — ABNORMAL HIGH (ref 22–32)
Calcium: 8.9 mg/dL (ref 8.9–10.3)
Chloride: 83 mmol/L — ABNORMAL LOW (ref 98–111)
Creatinine, Ser: 1.29 mg/dL — ABNORMAL HIGH (ref 0.61–1.24)
GFR, Estimated: >60 mL/min (ref >60)
Glucose, Bld: 110 mg/dL — ABNORMAL HIGH (ref 70–99)
Potassium: 3.7 mmol/L (ref 3.5–5.1)
Sodium: 134 mmol/L — ABNORMAL LOW (ref 135–145)

## 2020-01-01 MED ORDER — IPRATROPIUM-ALBUTEROL 0.5-2.5 (3) MG/3ML IN SOLN: 3.0000 mL | Freq: Two times a day (BID) | RESPIRATORY_TRACT | Status: DC

## 2020-01-01 MED ORDER — ALPRAZOLAM 0.25 MG PO TABS
0.2500 mg | ORAL_TABLET | Freq: Every day | ORAL | Status: DC
Start: 2020-01-02 — End: 2020-01-02
  Filled 2020-01-01: qty 1

## 2020-01-01 MED ORDER — AZITHROMYCIN 250 MG PO TABS
500.0000 mg | ORAL_TABLET | Freq: Every day | ORAL | Status: DC
  Administered 2020-01-01 – 2020-01-02 (×2): 500 mg via ORAL
  Filled 2020-01-01 (×2): qty 2

## 2020-01-01 MED ORDER — ALPRAZOLAM 0.25 MG PO TABS
0.2500 mg | ORAL_TABLET | Freq: Every day | ORAL | Status: DC
  Administered 2020-01-01: 21:00:00 0.25 mg via ORAL
  Filled 2020-01-01: qty 1

## 2020-01-01 MED ORDER — METHYLPREDNISOLONE SODIUM SUCC 40 MG IJ SOLR
40.0000 mg | Freq: Three times a day (TID) | INTRAMUSCULAR | Status: DC
  Administered 2020-01-01 – 2020-01-02 (×3): 40 mg via INTRAVENOUS
  Filled 2020-01-01 (×3): qty 1

## 2020-01-01 MED ORDER — IPRATROPIUM-ALBUTEROL 0.5-2.5 (3) MG/3ML IN SOLN
3.0000 mL | Freq: Three times a day (TID) | RESPIRATORY_TRACT | Status: DC
  Administered 2020-01-01 – 2020-01-02 (×2): 3 mL via RESPIRATORY_TRACT
  Filled 2020-01-01 (×2): qty 3

## 2020-01-01 MED ORDER — FLUTICASONE FUROATE-VILANTEROL 200-25 MCG/INH IN AEPB: 1.0000 | INHALATION_SPRAY | Freq: Every day | RESPIRATORY_TRACT | Status: DC

## 2020-01-01 MED ORDER — FLUTICASONE FUROATE-VILANTEROL 200-25 MCG/INH IN AEPB
1.0000 | INHALATION_SPRAY | Freq: Every day | RESPIRATORY_TRACT | Status: DC
  Administered 2020-01-02: 11:00:00 1 via RESPIRATORY_TRACT
  Filled 2020-01-01: qty 28

## 2020-01-01 NOTE — Progress Notes (Signed)
  Speech Language Pathology Treatment: Dysphagia  Patient Details Name: Randall Sawyer MRN: [id number] DOB: 1955/08/03 Today's Date: 01/01/2020 Time: 4628-6381 SLP Time Calculation (min) (ACUTE ONLY): 15 min  Assessment / Plan / Recommendation Clinical Impression  Pt seen for skilled treatment for dysphagia with breakfast tray.  Pt with improved vocal quality as he was able to speak with longer utterances without SOB evident within speech; pt educated re: breathing/swallowing reciprocity and respiratory pause during consumption of POs.  Pt able to reiterate via teach back method re: slow rate, small bites/sips, clearing throat and/or coughing intermittently during meals if vocal quality changes occur with min verbal cues given during tx session.  Pt should continue current diet of regular/thin liquids utilizing swallowing precautions d/t underlying COPD/weakness impacting overall swallow function.  Pt in agreement with treatment plan; ST will continue to f/u while in acute setting for diet tolerance/education.    HPI HPI: 64 y.o. male with medical history significant for COPD and asthma with chronic respiratory failure on 3 - 4 L of oxygen, primary chondrosarcoma of the ribs, hypertension; CXR on 12/29/19 indicated new nonspecific patchy reticulonodular opacities at both lung bases, differential includes aspiration, bronchopneumonia or fibrosis      SLP Plan  Continue with current plan of care       Recommendations  Diet recommendations: Regular;Thin liquid Liquids provided via: Straw;Cup Medication Administration: Whole meds with liquid Supervision: Patient able to self feed;Intermittent supervision to cue for compensatory strategies Compensations: Slow rate;Small sips/bites;Clear throat intermittently Postural Changes and/or Swallow Maneuvers: Seated upright 90 degrees                Oral Care Recommendations: Oral care BID Follow up Recommendations: None SLP Visit Diagnosis:  Dysphagia, unspecified (R13.10) Plan: Continue with current plan of care                       Elvina Sidle, M.S.,CCC-SLP 01/01/2020, 9:40 AM

## 2020-01-01 NOTE — Progress Notes (Signed)
PROGRESS NOTE        PATIENT DETAILS Name: Randall Sawyer Age: 64 y.o. Sex: male Date of Birth: Jul 17, 1955 Admit Date: 12/29/2019 Admitting Physician Ejiroghene Arlyce Dice, MD WVP:[id number], Cammie Mcgee, MD  Brief Narrative: Patient is a 64 y.o. male with history of COPD on 3-4 L of oxygen at home, primary chondrosarcoma,  of the rib, prior history of tobacco use,chronic bilateral lower extremity edema-presented with several days history of cough, shortness of breath-found to have acute on chronic hypoxic respiratory failure due to pneumonia triggering COPD exacerbation.  See below for further details  Significant events: 12/19>> admit for worsening hypoxemia due to COPD exacerbation/pneumonia  Significant studies: 12/19>> chest x-ray: New nonspecific patchy reticulonodular opacities at bilateral lung bases 12/20>> Echo: EF 60-65% 12/20>> bilateral lower extremity Dopplers: No DVT  Antimicrobial therapy: Rocephin: 12/19>> Zithromax: 12/20>>  Microbiology data: 12/19>> blood cultures:No growth  Procedures :  None  Consults: None  DVT Prophylaxis : Place TED hose Start: 12/30/19 1547 enoxaparin (LOVENOX) injection 40 mg Start: 12/30/19 0600    Subjective: -Patient states breathing is worse, query anxiety versus chronic dyspnea -Subjectively patient feels worse, O2 sats is okay -No chest pains no fevers -Patient became very symptomatic with attempt to ambulate him   Assessment/Plan: Acute on chronic hypoxic respiratory failure due to pneumonia and COPD exacerbation:  -Patient feels subjectively worse -May use Xanax as needed  continue to encourage incentive spirometry.   Continue IV Solu-Medrol, azithromycin and Rocephin along with bronchodilators and mucolytics   AKI: Hemodynamically mediated-due to diuretic use-stopped IV Lasix- -Creatinine is down to 1.29 from 1.42 -Continue oral Lasix -Avoid nephrotoxic  Hyponatremia: Secondary to  hypervolemia-and excessive free water intake.  Sodium levels much better after initiating IV diuretics-continue fluid restriction- -continue oral Lasix -Sodium is up to 134 from 129 couple days ago  Chronic lower extremity edema: Although chronic-apparently much worse over the past few weeks.  UA without proteinuria-LFTs stable-echo without any evidence of CHF.  He may have chronic venous stasis at baseline causing edema.   -Teds advised, oral Lasix as ordered  Chondrosarcoma-s/p right sixth rib resection: Followed by CT surgery have [facility].  Per notes available in care everywhere-no evidence of metastatic disease.  Fall x2 on day of admit-deconditioning: -Ambulatory dysfunction is improving -PT and OT eval appreciated no further needs at this time  Obesity: Estimated body mass index is 35.73 kg/m as calculated from the following:   Height as of this encounter: 5\' 8"  (1.727 m).   Weight as of this encounter: 106.6 kg.   Diet: Diet Order            Diet Heart Room service appropriate? Yes; Fluid consistency: Thin; Fluid restriction: 1500 mL Fluid  Diet effective now                  Code Status: Full code   Family Communication: Previously discussed with daughter in law Leafy Ro [phone])  over phone on 12/21  Disposition Plan: Status is: Inpatient  Remains inpatient appropriate because:Inpatient level of care appropriate due to severity of illness  Dispo: The patient is from: Home              Anticipated d/c is to: Home              Anticipated d/c date is: 1 days  Patient currently is not medically stable to d/c.   Barriers to Discharge: Pneumonia/COPD-significant edema-mild AKI-  Antimicrobial agents: Anti-infectives (From admission, onward)   Start     Dose/Rate Route Frequency Ordered Stop   01/01/20 1000  azithromycin (ZITHROMAX) tablet 500 mg        500 mg Oral Daily 12/31/19 1035 01/01/20 0817   12/30/19 1000  cefTRIAXone (ROCEPHIN) 2 g in  sodium chloride 0.9 % 100 mL IVPB        2 g 200 mL/hr over 30 Minutes Intravenous Every 24 hours 12/29/19 2332 01/04/20 0959   12/30/19 1000  azithromycin (ZITHROMAX) 500 mg in sodium chloride 0.9 % 250 mL IVPB  Status:  Discontinued        500 mg 250 mL/hr over 60 Minutes Intravenous Every 24 hours 12/29/19 2332 12/31/19 1035      MEDICATIONS: Scheduled Meds: . ALPRAZolam  0.25 mg Oral QHS  . [START ON 01/02/2020] ALPRAZolam  0.25 mg Oral Q breakfast  . enoxaparin (LOVENOX) injection  40 mg Subcutaneous Q24H  . furosemide  40 mg Oral Daily  . ipratropium-albuterol  3 mL Nebulization BID  . pantoprazole  40 mg Oral Daily  . predniSONE  40 mg Oral Q breakfast  . spironolactone  25 mg Oral Daily   Continuous Infusions: . cefTRIAXone (ROCEPHIN)  IV 2 g (01/01/20 0821)   PRN Meds:.acetaminophen **OR** acetaminophen, ipratropium-albuterol, ondansetron **OR** ondansetron (ZOFRAN) IV, polyethylene glycol   PHYSICAL EXAM: Vital signs: Vitals:   12/31/19 2008 01/01/20 0502 01/01/20 0756 01/01/20 1335  BP: (!) 140/57 114/84  (!) 154/61  Pulse: 91 88  87  Resp: 19 19  18   Temp: 98.2 F (36.8 C) 98.3 F (36.8 C)  97.9 F (36.6 C)  TempSrc:    Oral  SpO2: 94% 92% 95% 93%  Weight:      Height:       Filed Weights   12/29/19 2003  Weight: 106.6 kg   Body mass index is 35.73 kg/m.  Gen Exam:Alert awake-not in any distress Nose- [place] 4L/min HEENT:atraumatic, normocephalic Chest: Diminished in bases, few scattered wheezes  CVS:S1S2 regular Abdomen:soft non tender, non distended Extremities:++ edema, chronic venous stasis no open wound Neurology: Generalized weakness, Non focal Skin: no rash  I have personally reviewed following labs and imaging studies  LABORATORY DATA: CBC: Recent Labs  Lab 12/29/19 1310 12/30/19 0326 12/31/19 0734 01/01/20 0541  WBC 10.0 12.7* 12.9* 13.9*  HGB 10.1* 10.0* 9.6* 10.2*  HCT 31.5* 31.7* 30.0* 32.7*  MCV 106.4* 107.5* 107.9* 108.3*   PLT 307 351 306 956    Basic Metabolic Panel: Recent Labs  Lab 12/29/19 1310 12/30/19 0326 12/31/19 0734 01/01/20 0541  NA 130* 129* 132* 134*  K 3.8 4.2 4.2 3.7  CL 82* 81* 81* 83*  CO2 41* 37* 42* 42*  GLUCOSE 115* 137* 131* 110*  BUN 18 20 32* 32*  CREATININE 1.22 1.22 1.42* 1.29*  CALCIUM 8.8* 8.7* 8.8* 8.9    GFR: Estimated Creatinine Clearance: 66.7 mL/min (A) (by C-G formula based on SCr of 1.29 mg/dL (H)).  Liver Function Tests: Recent Labs  Lab 12/29/19 1310  AST 16  ALT 17  ALKPHOS 125  BILITOT 0.7  PROT 8.3*  ALBUMIN 3.5   No results for input(s): LIPASE, AMYLASE in the last 168 hours. No results for input(s): AMMONIA in the last 168 hours.  Coagulation Profile: No results for input(s): INR, PROTIME in the last 168 hours.  Cardiac Enzymes: No results  for input(s): CKTOTAL, CKMB, CKMBINDEX, TROPONINI in the last 168 hours.  BNP (last 3 results) No results for input(s): PROBNP in the last 8760 hours.  Lipid Profile: No results for input(s): CHOL, HDL, LDLCALC, TRIG, CHOLHDL, LDLDIRECT in the last 72 hours.  Thyroid Function Tests: No results for input(s): TSH, T4TOTAL, FREET4, T3FREE, THYROIDAB in the last 72 hours.  Anemia Panel: No results for input(s): VITAMINB12, FOLATE, FERRITIN, TIBC, IRON, RETICCTPCT in the last 72 hours.  Urine analysis:    Component Value Date/Time   COLORURINE YELLOW 12/30/2019 1011   APPEARANCEUR CLEAR 12/30/2019 1011   LABSPEC 1.012 12/30/2019 1011   PHURINE 5.0 12/30/2019 1011   GLUCOSEU NEGATIVE 12/30/2019 1011   HGBUR SMALL (A) 12/30/2019 1011   BILIRUBINUR NEGATIVE 12/30/2019 [place] 12/30/2019 1011   PROTEINUR NEGATIVE 12/30/2019 1011   NITRITE NEGATIVE 12/30/2019 [place] 12/30/2019 1011    Sepsis Labs: Lactic Acid, Venous    Component Value Date/Time   LATICACIDVEN 0.8 12/29/2019 1315    MICROBIOLOGY: Recent Results (from the past 240 hour(s))  Resp  Panel by RT-PCR (Flu A&B, Covid) Nasopharyngeal Swab     Status: None   Collection Time: 12/29/19  1:03 PM   Specimen: Nasopharyngeal Swab; Nasopharyngeal(NP) swabs in vial transport medium  Result Value Ref Range Status   SARS Coronavirus 2 by RT PCR NEGATIVE NEGATIVE Final    Comment: (NOTE) SARS-CoV-2 target nucleic acids are NOT DETECTED.  The SARS-CoV-2 RNA is generally detectable in upper respiratory specimens during the acute phase of infection. The lowest concentration of SARS-CoV-2 viral copies this assay can detect is 138 copies/mL. A negative result does not preclude SARS-Cov-2 infection and should not be used as the sole basis for treatment or other patient management decisions. A negative result may occur with  improper specimen collection/handling, submission of specimen other than nasopharyngeal swab, presence of viral mutation(s) within the areas targeted by this assay, and inadequate number of viral copies(<138 copies/mL). A negative result must be combined with clinical observations, patient history, and epidemiological information. The expected result is Negative.  Fact Sheet for Patients:  EntrepreneurPulse.com.au  Fact Sheet for Healthcare Providers:  IncredibleEmployment.be  This test is no t yet approved or cleared by the [place] FDA and  has been authorized for detection and/or diagnosis of SARS-CoV-2 by FDA under an Emergency Use Authorization (EUA). This EUA will remain  in effect (meaning this test can be used) for the duration of the COVID-19 declaration under Section 564(b)(1) of the Act, 21 U.S.C.section 360bbb-3(b)(1), unless the authorization is terminated  or revoked sooner.       Influenza A by PCR NEGATIVE NEGATIVE Final   Influenza B by PCR NEGATIVE NEGATIVE Final    Comment: (NOTE) The Xpert Xpress SARS-CoV-2/FLU/RSV plus assay is intended as an aid in the diagnosis of influenza from Nasopharyngeal  swab specimens and should not be used as a sole basis for treatment. Nasal washings and aspirates are unacceptable for Xpert Xpress SARS-CoV-2/FLU/RSV testing.  Fact Sheet for Patients: EntrepreneurPulse.com.au  Fact Sheet for Healthcare Providers: IncredibleEmployment.be  This test is not yet approved or cleared by the [place] FDA and has been authorized for detection and/or diagnosis of SARS-CoV-2 by FDA under an Emergency Use Authorization (EUA). This EUA will remain in effect (meaning this test can be used) for the duration of the COVID-19 declaration under Section 564(b)(1) of the Act, 21 U.S.C. section 360bbb-3(b)(1), unless the authorization is terminated or revoked.  Performed at [facility], [address] [place], [place] [address]   Blood culture (routine x 2)     Status: None (Preliminary result)   Collection Time: 12/29/19  1:16 PM   Specimen: BLOOD  Result Value Ref Range Status   Specimen Description BLOOD BLOOD RIGHT ARM  Final   Special Requests   Final    BOTTLES DRAWN AEROBIC AND ANAEROBIC Blood Culture adequate volume   Culture   Final    NO GROWTH 3 DAYS Performed at [facility], [address] [place], [place] [address]    Report Status PENDING  Incomplete  Blood culture (routine x 2)     Status: None (Preliminary result)   Collection Time: 12/29/19  3:20 PM   Specimen: BLOOD  Result Value Ref Range Status   Specimen Description BLOOD BLOOD LEFT ARM  Final   Special Requests   Final    BOTTLES DRAWN AEROBIC AND ANAEROBIC Blood Culture adequate volume   Culture   Final    NO GROWTH 3 DAYS Performed at [facility], [address], [place], [place] [address]    Report Status PENDING  Incomplete    RADIOLOGY STUDIES/RESULTS: No results found.   LOS: 3 days   Roxan Hockey, MD  [facility]    To contact the attending provider between 7A-7P or the covering provider during after hours 7P-7A,  please log into the web site [email or web address] and access using universal [place] password for that web site. If you do not have the password, please call the hospital operator.  01/01/2020, 5:46 PM

## 2020-01-01 NOTE — Progress Notes (Signed)
SATURATION QUALIFICATIONS: (This note is used to comply with regulatory documentation for home oxygen)  Patient Saturations on Room Air at Rest =88%  Patient Saturations on Room Air while Ambulating = 87%  Patient Saturations on 3 Liters of oxygen while Ambulating = 92%  Please briefly explain why patient needs home oxygen: Pt was only able to obtain sats >90% while ambualting when 3L was placed.

## 2020-01-01 NOTE — Care Management Important Message (Signed)
Important Message  Patient Details  Name: Randall Sawyer MRN: [id number] Date of Birth: 10-07-1955   Medicare Important Message Given:  Yes     Tommy Medal 01/01/2020, 4:32 PM

## 2020-01-01 NOTE — Progress Notes (Addendum)
  Speech Language Pathology Treatment: Dysphagia  Patient Details Name: Randall Sawyer MRN: [id number] DOB: 1955-01-13 Today's Date: 01/01/2020 Time: 9629-5284 SLP Time Calculation (min) (ACUTE ONLY): 15 min  Assessment / Plan / Recommendation Clinical Impression  Pt seen for brief session as pt was almost finished with breakfast tray when SLP arrived.  Prior attempt made to complete dysphagia tx when pt required IV replacement earlier this date.  Pt able to communicate via phrases-short sentences without significant dyspnea which is vastly different from evaluation on 12/30/19.  Pt stated he "feels better, but new medication is causing some side effects he isn't sure about." Consumption of thin via straw sips and solids observed with min verbal cueing for small sips required initially. Pt able to reiterate swallowing/breathing reciprocity strategies using teach back for strategies discussed during tx session as imminent d/c is expected.  ST will continue to f/u while in acute setting for dysphagia management/tx.    HPI HPI: 64 y.o. male with medical history significant for COPD and asthma with chronic respiratory failure on 3 - 4 L of oxygen, primary chondrosarcoma of the ribs, hypertensionCXR on 12/29/19 indicated New nonspecific patchy reticulonodular opacities at both lung bases,  differential includes aspiration, bronchopneumonia or fibrosis      SLP Plan  Continue with current plan of care       Recommendations  Diet recommendations: Regular;Thin liquid Liquids provided via: Straw;Cup Medication Administration: Whole meds with liquid Supervision: Patient able to self feed;Intermittent supervision to cue for compensatory strategies Compensations: Slow rate;Small sips/bites;Clear throat intermittently Postural Changes and/or Swallow Maneuvers: Seated upright 90 degrees                Oral Care Recommendations: Oral care BID Follow up Recommendations: None SLP Visit  Diagnosis: Dysphagia, unspecified (R13.10) Plan: Continue with current plan of care                       Elvina Sidle, M.S., CCC-SLP 01/01/2020, 10:02 AM

## 2020-01-02 LAB — BASIC METABOLIC PANEL
Anion gap: 11 (ref 5–15)
BUN: 26 mg/dL — ABNORMAL HIGH (ref 8–23)
CO2: 42 mmol/L — ABNORMAL HIGH (ref 22–32)
Calcium: 8.8 mg/dL — ABNORMAL LOW (ref 8.9–10.3)
Chloride: 82 mmol/L — ABNORMAL LOW (ref 98–111)
Creatinine, Ser: 1.16 mg/dL (ref 0.61–1.24)
GFR, Estimated: >60 mL/min (ref >60)
Glucose, Bld: 147 mg/dL — ABNORMAL HIGH (ref 70–99)
Potassium: 4.4 mmol/L (ref 3.5–5.1)
Sodium: 135 mmol/L (ref 135–145)

## 2020-01-02 LAB — MAGNESIUM: Magnesium: 2.5 mg/dL — ABNORMAL HIGH (ref 1.7–2.4)

## 2020-01-02 MED ORDER — BEVESPI AEROSPHERE 9-4.8 MCG/ACT IN AERO: 2.0000 | INHALATION_SPRAY | Freq: Two times a day (BID) | RESPIRATORY_TRACT | 11 refills | Status: DC

## 2020-01-02 MED ORDER — AZITHROMYCIN 500 MG PO TABS: 500.0000 mg | ORAL_TABLET | Freq: Every day | ORAL | 0 refills | Status: AC

## 2020-01-02 MED ORDER — ACETAMINOPHEN 325 MG PO TABS: 650.0000 mg | ORAL_TABLET | Freq: Four times a day (QID) | ORAL | 0 refills | Status: DC | PRN

## 2020-01-02 MED ORDER — SPIRONOLACTONE 25 MG PO TABS: 25.0000 mg | ORAL_TABLET | Freq: Every day | ORAL | 5 refills | Status: DC

## 2020-01-02 MED ORDER — FUROSEMIDE 40 MG PO TABS: 40.0000 mg | ORAL_TABLET | Freq: Every day | ORAL | 5 refills | Status: DC

## 2020-01-02 MED ORDER — TRAZODONE HCL 50 MG PO TABS: 50.0000 mg | ORAL_TABLET | Freq: Every day | ORAL | 2 refills | Status: DC

## 2020-01-02 MED ORDER — PROVENTIL HFA 108 (90 BASE) MCG/ACT IN AERS: 2.0000 | INHALATION_SPRAY | RESPIRATORY_TRACT | 1 refills | Status: DC | PRN

## 2020-01-02 MED ORDER — GUAIFENESIN-DM 100-10 MG/5ML PO SYRP: 10.0000 mL | ORAL_SOLUTION | Freq: Four times a day (QID) | ORAL | 0 refills | Status: AC | PRN

## 2020-01-02 MED ORDER — GUAIFENESIN ER 600 MG PO TB12: 600.0000 mg | ORAL_TABLET | Freq: Two times a day (BID) | ORAL | 0 refills | Status: AC

## 2020-01-02 MED ORDER — CEFDINIR 300 MG PO CAPS: 300.0000 mg | ORAL_CAPSULE | Freq: Two times a day (BID) | ORAL | 0 refills | Status: AC

## 2020-01-02 MED ORDER — FUROSEMIDE 10 MG/ML IJ SOLN
40.0000 mg | Freq: Once | INTRAMUSCULAR | Status: AC
  Administered 2020-01-02: 09:00:00 40 mg via INTRAVENOUS
  Filled 2020-01-02: qty 4

## 2020-01-02 MED ORDER — IPRATROPIUM-ALBUTEROL 0.5-2.5 (3) MG/3ML IN SOLN: 3.0000 mL | RESPIRATORY_TRACT | 1 refills | Status: DC | PRN

## 2020-01-02 MED ORDER — PREDNISONE 20 MG PO TABS: 40.0000 mg | ORAL_TABLET | Freq: Every day | ORAL | 0 refills | Status: DC

## 2020-01-02 MED ORDER — PANTOPRAZOLE SODIUM 40 MG PO TBEC: 40.0000 mg | DELAYED_RELEASE_TABLET | Freq: Every morning | ORAL | 3 refills | Status: DC

## 2020-01-02 NOTE — TOC Transition Note (Signed)
Transition of Care [facility]) - CM/SW Discharge Note  Patient Details  Name: Randall Sawyer MRN: [id number] Date of Birth: 19-Feb-1955  [facility] CM/SW Contact:  Sherie Don, LCSW Phone Number: 01/02/2020, 1:23 PM  Clinical Narrative: Patient is ready for discharge. TOC notified the family has been considering a hospital bed for the patient. CSW spoke with patient, who deferred to a family member, Spiros Greenfeld. CSW spoke with Ms. Belvedere regarding the hospital bed. Per Ms. Lembke, the family plans to take the patient home and if they think the patient will need the bed, the PCP will be contacted. TOC signing off.  Final next level of care: Home/Self Care Barriers to Discharge: Barriers Resolved  Patient Goals and CMS Choice Patient states their goals for this hospitalization and ongoing recovery are:: Return home CMS Medicare.gov Compare Post Acute Care list provided to:: Patient Represenative (must comment) Ronny Bacon) Choice offered to / list presented to : Patient  Discharge Plan and Services       DME Arranged: N/A DME Agency: NA HH Arranged: NA [place] Agency: NA  Readmission Risk Interventions No flowsheet data found.

## 2020-01-02 NOTE — Care Management Important Message (Signed)
Important Message  Patient Details  Name: Randall Sawyer MRN: [id number] Date of Birth: 10/03/1955   Medicare Important Message Given:  Yes     Sherie Don, LCSW 01/02/2020, 10:08 AM

## 2020-01-02 NOTE — Discharge Instructions (Signed)
1)you need oxygen at home at 3 L via nasal cannula continuously while awake and while asleep--- smoking or having open fires around oxygen can cause fire, significant injury and death  2) please note that there has been changes to your medications--please take your medications as prescribed  3) please follow-up with PCP in 7 to 10 days for repeat BMP blood test on oral reevaluation--- your PCP may start you on antidepressant or antianxiety medication  4)Very low-salt diet advised 5)Weigh yourself daily, call if you gain more than 3 pounds in 1 day or more than 5 pounds in 1 week as your diuretic medications may need to be adjusted 6)Limit your Fluid  intake to no more than 60 ounces (1.8 Liters) per day

## 2020-01-02 NOTE — Discharge Summary (Signed)
Randall Sawyer, is a 64 y.o. male  DOB [birth year]  MRN [id number].  Admission date:  12/29/2019  Admitting Physician  Bethena Roys, MD  Discharge Date:  01/02/2020   Primary MD  Susy Frizzle, MD  Recommendations for primary care physician for things to follow:   1)you need oxygen at home at 3 L via nasal cannula continuously while awake and while asleep--- smoking or having open fires around oxygen can cause fire, significant injury and death  2) please note that there has been changes to your medications--please take your medications as prescribed  3) please follow-up with PCP in 7 to 10 days for repeat BMP blood test on oral reevaluation--- your PCP may start you on antidepressant or antianxiety medication  4)Very low-salt diet advised 5)Weigh yourself daily, call if you gain more than 3 pounds in 1 day or more than 5 pounds in 1 week as your diuretic medications may need to be adjusted 6)Limit your Fluid  intake to no more than 60 ounces (1.8 Liters) per day  Admission Diagnosis  COPD exacerbation ([place]) [J44.1] COPD with acute exacerbation ([place]) [J44.1] PNA (pneumonia) [J18.9]  Discharge Diagnosis  COPD exacerbation ([place]) [J44.1] COPD with acute exacerbation ([place]) [J44.1] PNA (pneumonia) [J18.9]  Principal Problem:   COPD with acute exacerbation ([place]) Active Problems:   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 Asthma   . 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 COPD (chronic obstructive pulmonary disease) ([place])   . Hypertension   . Nephrolithiasis   . Tobacco abuse     Past Surgical History:  Procedure Laterality Date  . BIOPSY N/A 02/26/2015   Procedure: BIOPSY;  Surgeon: Rogene Houston, MD;   [facility];  Service: Endoscopy;  Laterality: N/A;  Gastric biopsies  . ESOPHAGOGASTRODUODENOSCOPY N/A 11/19/2014   Procedure: ESOPHAGOGASTRODUODENOSCOPY (EGD);  Surgeon: Rogene Houston, MD;  Location[facility];  Service: Endoscopy;  Laterality: N/A;  2:15-rescheduled to 11/9 @1 :69 Ann notified pt  . ESOPHAGOGASTRODUODENOSCOPY N/A 02/26/2015   Procedure: ESOPHAGOGASTRODUODENOSCOPY (EGD);  Surgeon: Rogene Houston, MD;  [facility];  Service: Endoscopy;  Laterality: N/A;  1225     HPI  from the history and physical done on the day of admission:     Chief Complaint: Cough, increasing difficulty breathing  HPI: Randall Sawyer is a 65 y.o. male with medical history significant for COPD and asthma with chronic respiratory failure on 3 - 4 L of oxygen, primary chondrosarcoma of the ribs, hypertension. Patient presented to the ED with complaints of increasing difficulty breathing that started on Friday 2 days ago.  Reports productive cough.  No chest pain.  He has chronic and unchanged bilateral lower extremity swelling.  ED Course: Stable vitals.  WBC  10.  Sodium 130.  BNP 54.  Lactic acid 0.8.  New nonspecific patchy reticulonodular opacities at both lung bases,-aspiration versus bronchopneumonia or fibrosis. Ceftriaxone and azithromycin started.  Bronchodilators given.  On my evaluation patient was on 4 L nasal cannula with O2 sats ranging from 85 to 89%.  Explained to patient why patient needed to be hospitalized especially with his hypoxia, he voiced understanding and he insisted on leaving AMA, signed AMA papers, I prescribed antibiotics, steroids and mucolytic's.  Return precautions emphasized to both patient and son.  Patient's son present at bedside. Few hours later, patient was back in the ED, reported that his O2 sats dropped to 71% so he came back to the ED.  Review of Systems: As per HPI all other systems reviewed and negative.    Hospital Course:    Brief Narrative: Patient is a 64 y.o. male with history of COPD on 3-4 L of oxygen at home, primary chondrosarcoma,  of the rib, prior history of tobacco use,chronic bilateral lower extremity edema-presented with several days history of cough, shortness of breath-found to have acute on chronic hypoxic respiratory failure due to pneumonia triggering COPD exacerbation.  See below for further details   Assessment/Plan: Acute on chronic hypoxic respiratory failure due to pneumonia and COPD exacerbation:  -Patient feels subjectively much better after sleeping well with Xanax overnight -Apparently at home patient sleeps on the couch, unable to lay flat presumably due to respiratory concerns and anxiety when he lays flat -Patient declines hospital bed that may help him recliner at home -He is agreeable to try trazodone nightly -Patient and his daughter-in-law Leafy Ro would like to hold off on antidepressants Xanax at this time  continue to encourage incentive spirometry.   -Patient was treated with IV Solu-Medrol, azithromycin and Rocephin along with bronchodilators and mucolytics -We will discharge home on Omnicef, prednisone and azithromycin  AKI: Hemodynamically mediated-due to diuretic use- stopped IV Lasix- -Creatinine is down to 1.16 from 1.42 -Continue oral Lasix -Avoid nephrotoxic agents  Hyponatremia: Secondary to hypervolemia-and excessive free water intake.  Sodium levels much better after initiating IV diuretics-continue fluid restriction- -continue oral Lasix -Sodium is up to 135 from 129 couple days ago  Chronic Lower Extremity Edema:   chronic-   UA without proteinuria-LFTs stable-echo without any evidence of CHF--EF 60 to 65% --Suspect chronic Venous Stasis at baseline causing Edema--this will persist as patient does not like to sleep in bed, patient sleeps on the couch with his feet down--- -Elevating the feet while sleeping in bed may help his lower extremity edema -Teds  advised, oral Lasix as ordered  Chondrosarcoma-s/p right sixth rib resection: Followed by CT surgery have [facility]  Per notes available in care everywhere-no evidence of metastatic disease.  Fall x2 on day of admit-deconditioning: -Ambulatory dysfunction is improving -PT and OT eval appreciated no further needs at this time  Obesity: Estimated body mass index is 35.73 kg/m as calculated from the following:   Height as of this encounter: 5\' 8"  (1.727 m).   Weight as of this encounter: 106.6 kg.    Significant events: 12/19>> admit for worsening hypoxemia due to COPD exacerbation/pneumonia  Significant studies: 12/19>> chest x-ray: New nonspecific patchy reticulonodular opacities at bilateral lung bases 12/20>> Echo: EF 60-65% 12/20>> bilateral lower extremity Dopplers: No DVT  Antimicrobial therapy: Rocephin: 12/19>> Zithromax: 12/20>>  Microbiology data: 12/19>> blood cultures:No growth  Discharge Condition: Stable, on 3 L of oxygen via nasal count  Follow UP--- PCP   Follow-up Information  Susy Frizzle, MD. Schedule an appointment as soon as possible for a visit in 1 week(s).   Specialty: Family Medicine Why: Repeat BMP Contact information: [place] [phone]               Consults obtained - na  Diet and Activity recommendation:  As advised  Discharge Instructions    Discharge Instructions    Call MD for:  difficulty breathing, headache or visual disturbances   Complete by: As directed    Call MD for:  persistant nausea and vomiting   Complete by: As directed    Call MD for:  severe uncontrolled pain   Complete by: As directed    Call MD for:  temperature >100.4   Complete by: As directed    Diet - low sodium heart healthy   Complete by: As directed    Discharge instructions   Complete by: As directed    1)you need oxygen at home at 3 L via nasal cannula continuously while awake and while asleep---  smoking or having open fires around oxygen can cause fire, significant injury and death  2) please note that there has been changes to your medications--please take your medications as prescribed  3) please follow-up with PCP in 7 to 10 days for repeat BMP blood test on oral reevaluation--- your PCP may start you on antidepressant or antianxiety medication  4)Very low-salt diet advised 5)Weigh yourself daily, call if you gain more than 3 pounds in 1 day or more than 5 pounds in 1 week as your diuretic medications may need to be adjusted 6)Limit your Fluid  intake to no more than 60 ounces (1.8 Liters) per day   Increase activity slowly   Complete by: As directed        Discharge Medications     Allergies as of 01/02/2020   No Known Allergies     Medication List    STOP taking these medications   naproxen sodium 220 MG tablet Commonly known as: ALEVE     TAKE these medications   acetaminophen 325 MG tablet Commonly known as: TYLENOL Take 2 tablets (650 mg total) by mouth every 6 (six) hours as needed for mild pain (or Fever >/= 101).   azithromycin 500 MG tablet Commonly known as: ZITHROMAX Take 1 tablet (500 mg total) by mouth daily for 5 days.   Bevespi Aerosphere 9-4.8 MCG/ACT Aero Generic drug: Glycopyrrolate-Formoterol Inhale 2 puffs into the lungs 2 (two) times daily.   cefdinir 300 MG capsule Commonly known as: OMNICEF Take 1 capsule (300 mg total) by mouth 2 (two) times daily for 5 days.   furosemide 40 MG tablet Commonly known as: LASIX Take 1 tablet (40 mg total) by mouth daily. Start taking on: January 03, 2020 What changed:   how much to take  how to take this  when to take this  additional instructions   guaiFENesin 600 MG 12 hr tablet Commonly known as: Mucinex Take 1 tablet (600 mg total) by mouth 2 (two) times daily for 10 days.   guaiFENesin-dextromethorphan 100-10 MG/5ML syrup Commonly known as: ROBITUSSIN DM Take 10 mLs by mouth  every 6 (six) hours as needed for up to 3 days for cough.   ipratropium-albuterol 0.5-2.5 (3) MG/3ML Soln Commonly known as: DUONEB Take 3 mLs by nebulization every 4 (four) hours as needed. What changed: when to take this   OXYGEN Inhale 3 L into the lungs daily. And  3 lpm with exertion   pantoprazole 40 MG tablet Commonly known as: PROTONIX Take 1 tablet (40 mg total) by mouth in the morning. 30 min prior to breakfast   predniSONE 20 MG tablet Commonly known as: Deltasone Take 2 tablets (40 mg total) by mouth daily with breakfast.   Proventil HFA 108 (90 Base) MCG/ACT inhaler Generic drug: albuterol Inhale 2 puffs into the lungs every 4 (four) hours as needed for shortness of breath.   spironolactone 25 MG tablet Commonly known as: ALDACTONE Take 1 tablet (25 mg total) by mouth daily. Start taking on: January 03, 2020   traZODone 50 MG tablet Commonly known as: DESYREL Take 1 tablet (50 mg total) by mouth at bedtime.       Major procedures and Radiology Reports - PLEASE review detailed and final reports for all details, in brief -    US Venous Img Lower Bilateral (DVT)  Result Date: 12/30/2019 CLINICAL DATA:  Bilateral lower extremity edema EXAM: BILATERAL LOWER EXTREMITY VENOUS DOPPLER ULTRASOUND TECHNIQUE: Gray-scale sonography with graded compression, as well as color Doppler and duplex ultrasound were performed to evaluate the lower extremity deep venous systems from the level of the common femoral vein and including the common femoral, femoral, profunda femoral, popliteal and calf veins including the posterior tibial, peroneal and gastrocnemius veins when visible. The superficial great saphenous vein was also interrogated. Spectral Doppler was utilized to evaluate flow at rest and with distal augmentation maneuvers in the common femoral, femoral and popliteal veins. COMPARISON:  None. FINDINGS: RIGHT LOWER EXTREMITY Common Femoral Vein: No evidence of thrombus. Normal  compressibility, respiratory phasicity and response to augmentation. Saphenofemoral Junction: No evidence of thrombus. Normal compressibility and flow on color Doppler imaging. Profunda Femoral Vein: No evidence of thrombus. Normal compressibility and flow on color Doppler imaging. Femoral Vein: No evidence of thrombus. Normal compressibility, respiratory phasicity and response to augmentation. Popliteal Vein: No evidence of thrombus. Normal compressibility, respiratory phasicity and response to augmentation. Calf Veins: Calf veins are not well visualized due to underlying soft tissue edema. Superficial Great Saphenous Vein: No evidence of thrombus. Normal compressibility. Venous Reflux:  None. Other Findings:  None. LEFT LOWER EXTREMITY Common Femoral Vein: No evidence of thrombus. Normal compressibility, respiratory phasicity and response to augmentation. Saphenofemoral Junction: No evidence of thrombus. Normal compressibility and flow on color Doppler imaging. Profunda Femoral Vein: No evidence of thrombus. Normal compressibility and flow on color Doppler imaging. Femoral Vein: No evidence of thrombus. Normal compressibility, respiratory phasicity and response to augmentation. Popliteal Vein: No evidence of thrombus. Normal compressibility, respiratory phasicity and response to augmentation. Calf Veins: Calf veins are not well visualized due to underlying soft tissue edema. Superficial Great Saphenous Vein: No evidence of thrombus. Normal compressibility. Venous Reflux:  None. Other Findings:  None. IMPRESSION: No definitive deep venous thrombosis noted. Calf edema bilaterally. Electronically Signed   By: Inez Catalina M.D.   On: 12/30/2019 11:43   DG Chest Port 1 View  Result Date: 12/29/2019 CLINICAL DATA:  Dyspnea, COPD, history of right sixth rib chondrosarcoma, former smoker EXAM: PORTABLE CHEST 1 VIEW COMPARISON:  03/02/2017 chest radiograph. FINDINGS: Stable cardiomediastinal silhouette with normal  heart size. No pneumothorax. Patchy reticulonodular opacities at both lung bases are new. No overt pulmonary edema. No acute consolidative airspace disease. No pleural effusions. IMPRESSION: New nonspecific patchy reticulonodular opacities at both lung bases, differential includes aspiration, bronchopneumonia or fibrosis. Follow-up chest radiographs advised. Electronically Signed   By: Ilona Sorrel M.D.   On:  12/29/2019 13:59   ECHOCARDIOGRAM COMPLETE  Result Date: 12/30/2019    ECHOCARDIOGRAM REPORT   Patient Name:   Randall Sawyer Date of Exam: 12/30/2019 Medical Rec #:  [id number]         Height:       68.0 in Accession #:    [id number]        Weight:       235.0 lb Date of Birth:  [birth year]        BSA:          2.189 m Patient Age:    63 years          BP:           141/59 mmHg Patient Gender: M                 HR:           95 bpm. Exam Location:  [facility] Procedure: 2D Echo Indications:    Congestive Heart Failure I50.9  History:        Patient has prior history of Echocardiogram examinations, most                 recent 01/24/2014. COPD; Risk Factors:Former Smoker. Pneumonia.  Sonographer:    Leavy Cella RDCS (AE) Referring Phys: [place]  1. Left ventricular ejection fraction, by estimation, is 60 to 65%. The left ventricle has normal function. The left ventricle has no regional wall motion abnormalities. There is mild left ventricular hypertrophy. Left ventricular diastolic parameters were normal.  2. Right ventricular systolic function is normal. The right ventricular size is normal.  3. Left atrial size was mildly dilated.  4. The mitral valve was not well visualized. Trivial mitral valve regurgitation. No evidence of mitral stenosis.  5. The aortic valve was not well visualized. Aortic valve regurgitation is not visualized. No aortic stenosis is present.  6. The inferior vena cava is normal in size with <50% respiratory variability, suggesting right atrial pressure  of 8 mmHg. FINDINGS  Left Ventricle: Left ventricular ejection fraction, by estimation, is 60 to 65%. The left ventricle has normal function. The left ventricle has no regional wall motion abnormalities. The left ventricular internal cavity size was normal in size. There is  mild left ventricular hypertrophy. Left ventricular diastolic parameters were normal. Right Ventricle: The right ventricular size is normal. No increase in right ventricular wall thickness. Right ventricular systolic function is normal. Left Atrium: Left atrial size was mildly dilated. Right Atrium: Right atrial size was normal in size. Pericardium: There is no evidence of pericardial effusion. Mitral Valve: The mitral valve was not well visualized. Trivial mitral valve regurgitation. No evidence of mitral valve stenosis. Tricuspid Valve: The tricuspid valve is normal in structure. Tricuspid valve regurgitation is trivial. No evidence of tricuspid stenosis. Aortic Valve: The aortic valve was not well visualized. Aortic valve regurgitation is not visualized. No aortic stenosis is present. Pulmonic Valve: The pulmonic valve was normal in structure. Pulmonic valve regurgitation is not visualized. No evidence of pulmonic stenosis. Aorta: The aortic root is normal in size and structure. Venous: The inferior vena cava is normal in size with less than 50% respiratory variability, suggesting right atrial pressure of 8 mmHg. IAS/Shunts: No atrial level shunt detected by color flow Doppler.  LEFT VENTRICLE PLAX 2D LVIDd:         4.83 cm  Diastology LVIDs:         3.27 cm  LV e'  medial:    8.70 cm/s LV PW:         1.30 cm  LV E/e' medial:  16.2 LV IVS:        1.22 cm  LV e' lateral:   10.90 cm/s LVOT diam:     2.00 cm  LV E/e' lateral: 12.9 LVOT Area:     3.14 cm  RIGHT VENTRICLE RV S prime:     17.40 cm/s TAPSE (M-mode): 2.4 cm LEFT ATRIUM             Index       RIGHT ATRIUM          Index LA diam:        3.80 cm 1.74 cm/m  RA Area:     9.99 cm LA Vol  (A2C):   32.6 ml 14.89 ml/m RA Volume:   19.30 ml 8.82 ml/m LA Vol (A4C):   42.7 ml 19.51 ml/m LA Biplane Vol: 36.6 ml 16.72 ml/m   AORTA Ao Root diam: 2.30 cm MITRAL VALVE MV Area (PHT): 4.52 cm     SHUNTS MV Decel Time: 168 msec     Systemic Diam: 2.00 cm MV E velocity: 141.00 cm/s MV A velocity: 108.00 cm/s MV E/A ratio:  1.31 Jenkins Rouge MD Electronically signed by Jenkins Rouge MD Signature Date/Time: 12/30/2019/3:52:42 PM    Final     Micro Results    Recent Results (from the past 240 hour(s))  Resp Panel by RT-PCR (Flu A&B, Covid) Nasopharyngeal Swab     Status: None   Collection Time: 12/29/19  1:03 PM   Specimen: Nasopharyngeal Swab; Nasopharyngeal(NP) swabs in vial transport medium  Result Value Ref Range Status   SARS Coronavirus 2 by RT PCR NEGATIVE NEGATIVE Final    Comment: (NOTE) SARS-CoV-2 target nucleic acids are NOT DETECTED.  The SARS-CoV-2 RNA is generally detectable in upper respiratory specimens during the acute phase of infection. The lowest concentration of SARS-CoV-2 viral copies this assay can detect is 138 copies/mL. A negative result does not preclude SARS-Cov-2 infection and should not be used as the sole basis for treatment or other patient management decisions. A negative result may occur with  improper specimen collection/handling, submission of specimen other than nasopharyngeal swab, presence of viral mutation(s) within the areas targeted by this assay, and inadequate number of viral copies(<138 copies/mL). A negative result must be combined with clinical observations, patient history, and epidemiological information. The expected result is Negative.  Fact Sheet for Patients:  EntrepreneurPulse.com.au  Fact Sheet for Healthcare Providers:  IncredibleEmployment.be  This test is no t yet approved or cleared by the [place] FDA and  has been authorized for detection and/or diagnosis of SARS-CoV-2 by FDA  under an Emergency Use Authorization (EUA). This EUA will remain  in effect (meaning this test can be used) for the duration of the COVID-19 declaration under Section 564(b)(1) of the Act, 21 U.S.C.section 360bbb-3(b)(1), unless the authorization is terminated  or revoked sooner.       Influenza A by PCR NEGATIVE NEGATIVE Final   Influenza B by PCR NEGATIVE NEGATIVE Final    Comment: (NOTE) The Xpert Xpress SARS-CoV-2/FLU/RSV plus assay is intended as an aid in the diagnosis of influenza from Nasopharyngeal swab specimens and should not be used as a sole basis for treatment. Nasal washings and aspirates are unacceptable for Xpert Xpress SARS-CoV-2/FLU/RSV testing.  Fact Sheet for Patients: EntrepreneurPulse.com.au  Fact Sheet for Healthcare Providers: IncredibleEmployment.be  This test is not yet approved  or cleared by the [place] and has been authorized for detection and/or diagnosis of SARS-CoV-2 by FDA under an Emergency Use Authorization (EUA). This EUA will remain in effect (meaning this test can be used) for the duration of the COVID-19 declaration under Section 564(b)(1) of the Act, 21 U.S.C. section 360bbb-3(b)(1), unless the authorization is terminated or revoked.  Performed at [facility], [address], [place], [place] [address]   Blood culture (routine x 2)     Status: None (Preliminary result)   Collection Time: 12/29/19  1:16 PM   Specimen: BLOOD  Result Value Ref Range Status   Specimen Description BLOOD BLOOD RIGHT ARM  Final   Special Requests   Final    BOTTLES DRAWN AEROBIC AND ANAEROBIC Blood Culture adequate volume   Culture   Final    NO GROWTH 4 DAYS Performed at [facility], [address], [place], [place] [address]    Report Status PENDING  Incomplete  Blood culture (routine x 2)     Status: None (Preliminary result)   Collection Time: 12/29/19  3:20 PM   Specimen: BLOOD  Result Value Ref Range  Status   Specimen Description BLOOD BLOOD LEFT ARM  Final   Special Requests   Final    BOTTLES DRAWN AEROBIC AND ANAEROBIC Blood Culture adequate volume   Culture   Final    NO GROWTH 4 DAYS Performed at [facility], [address] [place], [place] [address]    Report Status PENDING  Incomplete    Today   Subjective    [place] Fant today has no new complaint -Slept very well with Xanax overnight, initially while waking up he has some confusional episodes -Back to baseline now oriented and coherent -Patient's daughter-in-law Leafy Ro is at bedside -Doing well on 3 L via nasal cannula          Patient has been seen and examined prior to discharge   Objective   Blood pressure (!) 152/71, pulse 96, temperature 98.2 F (36.8 C), resp. rate 19, height 5\' 8"  (1.727 m), weight 106.6 kg, SpO2 90 %.   Intake/Output Summary (Last 24 hours) at 01/02/2020 1115 Last data filed at 01/01/2020 2037 Gross per 24 hour  Intake 700 ml  Output --  Net 700 ml    Exam Body mass index is 35.73 kg/m.  Gen Exam:Alert awake-not in any distress Nose- [place] 3L/min HEENT:atraumatic, normocephalic Chest:  Improved air movement, no wheezing  CVS:S1S2 regular Abdomen:soft non tender, non distended Extremities:++ edema, chronic venous stasis, no open wounds Neurology: Generalized weakness, Non focal Skin: no rash, warm and dry Psych--- alert and oriented x3, coherent and cooperative    Data Review   CBC w Diff:  Lab Results  Component Value Date   WBC 13.9 (H) 01/01/2020   HGB 10.2 (L) 01/01/2020   HCT 32.7 (L) 01/01/2020   PLT 333 01/01/2020   LYMPHOPCT 17.3 11/15/2016   MONOPCT 8.8 11/15/2016   EOSPCT 4.5 11/15/2016   BASOPCT 1.6 11/15/2016    CMP:  Lab Results  Component Value Date   NA 135 01/02/2020   K 4.4 01/02/2020   CL 82 (L) 01/02/2020   CO2 42 (H) 01/02/2020   BUN 26 (H) 01/02/2020   CREATININE 1.16 01/02/2020   CREATININE 0.94 11/03/2014   PROT 8.3 (H)  12/29/2019   ALBUMIN 3.5 12/29/2019   BILITOT 0.7 12/29/2019   ALKPHOS 125 12/29/2019   AST 16 12/29/2019   ALT 17 12/29/2019  .  Total Discharge time is about 33 minutes  Roxan Hockey M.D on 01/02/2020 at 11:15 AM  Go to [email or web address] -  for contact info  [facility] - Office  [phone]

## 2020-01-03 LAB — CULTURE, BLOOD (ROUTINE X 2)
Culture: NO GROWTH
Culture: NO GROWTH
Special Requests: ADEQUATE
Special Requests: ADEQUATE

## 2020-01-19 DIAGNOSIS — J449 Chronic obstructive pulmonary disease, unspecified: Secondary | ICD-10-CM | POA: Diagnosis not present

## 2020-01-31 DIAGNOSIS — J449 Chronic obstructive pulmonary disease, unspecified: Secondary | ICD-10-CM | POA: Diagnosis not present

## 2020-02-19 DIAGNOSIS — J449 Chronic obstructive pulmonary disease, unspecified: Secondary | ICD-10-CM | POA: Diagnosis not present

## 2020-03-02 DIAGNOSIS — J449 Chronic obstructive pulmonary disease, unspecified: Secondary | ICD-10-CM | POA: Diagnosis not present

## 2020-03-18 DIAGNOSIS — J449 Chronic obstructive pulmonary disease, unspecified: Secondary | ICD-10-CM | POA: Diagnosis not present

## 2020-03-30 DIAGNOSIS — J449 Chronic obstructive pulmonary disease, unspecified: Secondary | ICD-10-CM | POA: Diagnosis not present

## 2020-04-18 DIAGNOSIS — J449 Chronic obstructive pulmonary disease, unspecified: Secondary | ICD-10-CM | POA: Diagnosis not present

## 2020-04-30 DIAGNOSIS — J449 Chronic obstructive pulmonary disease, unspecified: Secondary | ICD-10-CM | POA: Diagnosis not present

## 2020-05-18 DIAGNOSIS — J449 Chronic obstructive pulmonary disease, unspecified: Secondary | ICD-10-CM | POA: Diagnosis not present

## 2020-05-19 ENCOUNTER — Telehealth (INDEPENDENT_AMBULATORY_CARE_PROVIDER_SITE_OTHER): Payer: Self-pay | Admitting: Internal Medicine

## 2020-05-19 ENCOUNTER — Other Ambulatory Visit (INDEPENDENT_AMBULATORY_CARE_PROVIDER_SITE_OTHER): Payer: Self-pay

## 2020-05-19 DIAGNOSIS — K219 Gastro-esophageal reflux disease without esophagitis: Secondary | ICD-10-CM

## 2020-05-19 MED ORDER — PANTOPRAZOLE SODIUM 40 MG PO TBEC: 40.0000 mg | DELAYED_RELEASE_TABLET | Freq: Every morning | ORAL | 3 refills | Status: DC

## 2020-05-19 NOTE — Telephone Encounter (Signed)
Patient called the office stated he needs a refill on Protonix - please advise - ph# [phone]

## 2020-05-19 NOTE — Telephone Encounter (Signed)
Medication sent to the requested pharmacy, patient aware.   Spoke with the patient he wanted this sent to [facility] on [address]

## 2020-05-30 DIAGNOSIS — J449 Chronic obstructive pulmonary disease, unspecified: Secondary | ICD-10-CM | POA: Diagnosis not present

## 2020-06-18 DIAGNOSIS — J449 Chronic obstructive pulmonary disease, unspecified: Secondary | ICD-10-CM | POA: Diagnosis not present

## 2020-06-30 DIAGNOSIS — J449 Chronic obstructive pulmonary disease, unspecified: Secondary | ICD-10-CM | POA: Diagnosis not present

## 2020-07-18 DIAGNOSIS — J449 Chronic obstructive pulmonary disease, unspecified: Secondary | ICD-10-CM | POA: Diagnosis not present

## 2020-07-24 ENCOUNTER — Other Ambulatory Visit: Payer: Self-pay

## 2020-07-24 DIAGNOSIS — Z13228 Encounter for screening for other metabolic disorders: Secondary | ICD-10-CM

## 2020-07-24 DIAGNOSIS — Z130 Encounter for screening for diseases of the blood and blood-forming organs and certain disorders involving the immune mechanism: Secondary | ICD-10-CM

## 2020-07-24 DIAGNOSIS — Z13220 Encounter for screening for lipoid disorders: Secondary | ICD-10-CM

## 2020-07-24 DIAGNOSIS — Z125 Encounter for screening for malignant neoplasm of prostate: Secondary | ICD-10-CM

## 2020-07-28 ENCOUNTER — Other Ambulatory Visit: Payer: Medicare Other

## 2020-07-28 ENCOUNTER — Other Ambulatory Visit: Payer: Self-pay

## 2020-07-28 DIAGNOSIS — Z136 Encounter for screening for cardiovascular disorders: Secondary | ICD-10-CM | POA: Diagnosis not present

## 2020-07-28 DIAGNOSIS — Z13228 Encounter for screening for other metabolic disorders: Secondary | ICD-10-CM | POA: Diagnosis not present

## 2020-07-28 DIAGNOSIS — Z130 Encounter for screening for diseases of the blood and blood-forming organs and certain disorders involving the immune mechanism: Secondary | ICD-10-CM | POA: Diagnosis not present

## 2020-07-28 DIAGNOSIS — Z13220 Encounter for screening for lipoid disorders: Secondary | ICD-10-CM

## 2020-07-28 DIAGNOSIS — J449 Chronic obstructive pulmonary disease, unspecified: Secondary | ICD-10-CM | POA: Diagnosis not present

## 2020-07-28 DIAGNOSIS — Z125 Encounter for screening for malignant neoplasm of prostate: Secondary | ICD-10-CM

## 2020-07-28 LAB — COMPLETE METABOLIC PANEL WITH GFR
AG Ratio: 1.1 (calc) (ref 1.0–2.5)
ALT: 8 U/L — ABNORMAL LOW (ref 9–46)
AST: 11 U/L (ref 10–35)
Albumin: 3.9 g/dL (ref 3.6–5.1)
Alkaline phosphatase (APISO): 111 U/L (ref 35–144)
BUN: 11 mg/dL (ref 7–25)
CO2: 37 mmol/L — ABNORMAL HIGH (ref 20–32)
Calcium: 9 mg/dL (ref 8.6–10.3)
Chloride: 95 mmol/L — ABNORMAL LOW (ref 98–110)
Creat: 1.35 mg/dL (ref 0.70–1.35)
Globulin: 3.4 g/dL (calc) (ref 1.9–3.7)
Glucose, Bld: 110 mg/dL — ABNORMAL HIGH (ref 65–99)
Potassium: 4.8 mmol/L (ref 3.5–5.3)
Sodium: 138 mmol/L (ref 135–146)
Total Bilirubin: 0.5 mg/dL (ref 0.2–1.2)
Total Protein: 7.3 g/dL (ref 6.1–8.1)
eGFR: 58 mL/min/{1.73_m2} — ABNORMAL LOW (ref >=60)

## 2020-07-28 LAB — CBC WITH DIFFERENTIAL/PLATELET
Absolute Monocytes: 575 cells/uL (ref 200–950)
Basophils Absolute: 57 cells/uL (ref 0–200)
Basophils Relative: 0.7 %
Eosinophils Absolute: 348 cells/uL (ref 15–500)
Eosinophils Relative: 4.3 %
HCT: 35.2 % — ABNORMAL LOW (ref 38.5–50.0)
Hemoglobin: 11.4 g/dL — ABNORMAL LOW (ref 13.2–17.1)
Lymphs Abs: 1175 cells/uL (ref 850–3900)
MCH: 32.7 pg (ref 27.0–33.0)
MCHC: 32.4 g/dL (ref 32.0–36.0)
MCV: 100.9 fL — ABNORMAL HIGH (ref 80.0–100.0)
MPV: 10.8 fL (ref 7.5–12.5)
Monocytes Relative: 7.1 %
Neutro Abs: 5945 cells/uL (ref 1500–7800)
Neutrophils Relative %: 73.4 %
Platelets: 345 10*3/uL (ref 140–400)
RBC: 3.49 10*6/uL — ABNORMAL LOW (ref 4.20–5.80)
RDW: 15.9 % — ABNORMAL HIGH (ref 11.0–15.0)
Total Lymphocyte: 14.5 %
WBC: 8.1 10*3/uL (ref 3.8–10.8)

## 2020-07-28 LAB — LIPID PANEL
Cholesterol: 208 mg/dL — ABNORMAL HIGH (ref <200)
HDL: 44 mg/dL (ref >=40)
LDL Cholesterol (Calc): 133 mg/dL (calc) — ABNORMAL HIGH
Non-HDL Cholesterol (Calc): 164 mg/dL (calc) — ABNORMAL HIGH (ref <130)
Total CHOL/HDL Ratio: 4.7 (calc) (ref <5.0)
Triglycerides: 170 mg/dL — ABNORMAL HIGH (ref <150)

## 2020-07-28 LAB — PSA: PSA: 1.28 ng/mL (ref <=4.00)

## 2020-07-29 ENCOUNTER — Other Ambulatory Visit: Payer: Self-pay

## 2020-07-30 DIAGNOSIS — J449 Chronic obstructive pulmonary disease, unspecified: Secondary | ICD-10-CM | POA: Diagnosis not present

## 2020-07-31 ENCOUNTER — Encounter: Payer: Self-pay | Admitting: Family Medicine

## 2020-07-31 ENCOUNTER — Other Ambulatory Visit: Payer: Self-pay

## 2020-07-31 ENCOUNTER — Ambulatory Visit (INDEPENDENT_AMBULATORY_CARE_PROVIDER_SITE_OTHER): Payer: Medicare Other | Admitting: Family Medicine

## 2020-07-31 VITALS — BP 148/78 | HR 98 | Temp 98.2°F | Resp 20 | Ht 68.0 in | Wt 234.0 lb

## 2020-07-31 DIAGNOSIS — J9612 Chronic respiratory failure with hypercapnia: Secondary | ICD-10-CM | POA: Diagnosis not present

## 2020-07-31 DIAGNOSIS — L409 Psoriasis, unspecified: Secondary | ICD-10-CM | POA: Diagnosis not present

## 2020-07-31 DIAGNOSIS — J449 Chronic obstructive pulmonary disease, unspecified: Secondary | ICD-10-CM | POA: Diagnosis not present

## 2020-07-31 DIAGNOSIS — I509 Heart failure, unspecified: Secondary | ICD-10-CM | POA: Diagnosis not present

## 2020-07-31 DIAGNOSIS — J9611 Chronic respiratory failure with hypoxia: Secondary | ICD-10-CM | POA: Diagnosis not present

## 2020-07-31 DIAGNOSIS — K219 Gastro-esophageal reflux disease without esophagitis: Secondary | ICD-10-CM | POA: Diagnosis not present

## 2020-07-31 MED ORDER — FUROSEMIDE 40 MG PO TABS: 40.0000 mg | ORAL_TABLET | Freq: Every day | ORAL | 1 refills | Status: DC

## 2020-07-31 MED ORDER — PROVENTIL HFA 108 (90 BASE) MCG/ACT IN AERS: 2.0000 | INHALATION_SPRAY | RESPIRATORY_TRACT | 1 refills | Status: DC | PRN

## 2020-07-31 MED ORDER — PANTOPRAZOLE SODIUM 40 MG PO TBEC: 40.0000 mg | DELAYED_RELEASE_TABLET | Freq: Every morning | ORAL | 1 refills | Status: DC

## 2020-07-31 NOTE — Progress Notes (Signed)
Subjective:    Patient ID: Randall Sawyer, male    DOB: 02-10-1955, 65 y.o.   MRN: [id number]  HPI  Patient has a history of COPD requiring chronic oxygen.  He is on Bevespi.  He also has a history of acute cor pulmonale for which he takes Lasix 40 mg daily.  Has been out of Lasix for 2 weeks.  He presents today with significant swelling in both legs and his abdomen as well as in his face.  He also has faint bibasilar crackles in both lungs.  He denies any chest pain however he does report some shortness of breath beyond his baseline.  He has markedly diminished breath sounds bilaterally with faint expiratory wheezing however he appears fluid overloaded on exam.  His most recent lab work is listed below: Appointment on 07/28/2020  Component Date Value Ref Range Status   PSA 07/28/2020 1.28  < OR = 4.00 ng/mL Final   Comment: The total PSA value from this assay system is  standardized against the WHO standard. The test  result will be approximately 20% lower when compared  to the equimolar-standardized total PSA (Beckman  Coulter). Comparison of serial PSA results should be  interpreted with this fact in mind. . This test was performed using the Siemens  chemiluminescent method. Values obtained from  different assay methods cannot be used interchangeably. PSA levels, regardless of value, should not be interpreted as absolute evidence of the presence or absence of disease.    Glucose, Bld 07/28/2020 110 (A) 65 - 99 mg/dL Final   Comment: .            Fasting reference interval . For someone without known diabetes, a glucose value between 100 and 125 mg/dL is consistent with prediabetes and should be confirmed with a follow-up test. .    BUN 07/28/2020 11  7 - 25 mg/dL Final   Creat 07/28/2020 1.35  0.70 - 1.35 mg/dL Final   eGFR 07/28/2020 58 (A) > OR = 60 mL/min/1.60m Final   Comment: The eGFR is based on the CKD-EPI 2021 equation. To calculate  the new eGFR from a previous  Creatinine or Cystatin C result, go to https://www.kidney.org/professionals/ kdoqi/gfr%5Fcalculator    BUN/Creatinine Ratio 063/14/9702NOT APPLICABLE  6 - 22 (calc) Final   Sodium 07/28/2020 138  135 - 146 mmol/L Final   Potassium 07/28/2020 4.8  3.5 - 5.3 mmol/L Final   Chloride 07/28/2020 95 (A) 98 - 110 mmol/L Final   CO2 07/28/2020 37 (A) 20 - 32 mmol/L Final   Calcium 07/28/2020 9.0  8.6 - 10.3 mg/dL Final   Total Protein 07/28/2020 7.3  6.1 - 8.1 g/dL Final   Albumin 07/28/2020 3.9  3.6 - 5.1 g/dL Final   Globulin 07/28/2020 3.4  1.9 - 3.7 g/dL (calc) Final   AG Ratio 07/28/2020 1.1  1.0 - 2.5 (calc) Final   Total Bilirubin 07/28/2020 0.5  0.2 - 1.2 mg/dL Final   Alkaline phosphatase (APISO) 07/28/2020 111  35 - 144 U/L Final   AST 07/28/2020 11  10 - 35 U/L Final   ALT 07/28/2020 8 (A) 9 - 46 U/L Final   WBC 07/28/2020 8.1  3.8 - 10.8 Thousand/uL Final   RBC 07/28/2020 3.49 (A) 4.20 - 5.80 Million/uL Final   Hemoglobin 07/28/2020 11.4 (A) 13.2 - 17.1 g/dL Final   HCT 07/28/2020 35.2 (A) 38.5 - 50.0 % Final   MCV 07/28/2020 100.9 (A) 80.0 - 100.0 fL Final   MCH  07/28/2020 32.7  27.0 - 33.0 pg Final   MCHC 07/28/2020 32.4  32.0 - 36.0 g/dL Final   RDW 07/28/2020 15.9 (A) 11.0 - 15.0 % Final   Platelets 07/28/2020 345  140 - 400 Thousand/uL Final   MPV 07/28/2020 10.8  7.5 - 12.5 fL Final   Neutro Abs 07/28/2020 5,945  1,500 - 7,800 cells/uL Final   Lymphs Abs 07/28/2020 1,175  850 - 3,900 cells/uL Final   Absolute Monocytes 07/28/2020 575  200 - 950 cells/uL Final   Eosinophils Absolute 07/28/2020 348  15 - 500 cells/uL Final   Basophils Absolute 07/28/2020 57  0 - 200 cells/uL Final   Neutrophils Relative % 07/28/2020 73.4  % Final   Total Lymphocyte 07/28/2020 14.5  % Final   Monocytes Relative 07/28/2020 7.1  % Final   Eosinophils Relative 07/28/2020 4.3  % Final   Basophils Relative 07/28/2020 0.7  % Final   Cholesterol 07/28/2020 208 (A) <200 mg/dL Final   HDL  07/28/2020 44  > OR = 40 mg/dL Final   Triglycerides 07/28/2020 170 (A) <150 mg/dL Final   LDL Cholesterol (Calc) 07/28/2020 133 (A) mg/dL (calc) Final   Comment: Reference range: <100 . Desirable range <100 mg/dL for primary prevention;   <70 mg/dL for patients with CHD or diabetic patients  with > or = 2 CHD risk factors. Marland Kitchen LDL-C is now calculated using the Martin-Hopkins  calculation, which is a validated novel method providing  better accuracy than the Friedewald equation in the  estimation of LDL-C.  Cresenciano Genre et al. Annamaria Helling. 9983;382(50): 2061-2068  (http://education.[employer].com/faq/FAQ164)    Total CHOL/HDL Ratio 07/28/2020 4.7  <5.0 (calc) Final   Non-HDL Cholesterol (Calc) 07/28/2020 164 (A) <130 mg/dL (calc) Final   Comment: For patients with diabetes plus 1 major ASCVD risk  factor, treating to a non-HDL-C goal of <100 mg/dL  (LDL-C of <70 mg/dL) is considered a therapeutic  option.     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, 4.5 cm, grade 2   COPD (chronic obstructive pulmonary disease) (HCC)    Hypertension    Nephrolithiasis    Tobacco abuse    Past Surgical History:  Procedure Laterality Date   BIOPSY N/A 02/26/2015   Procedure: BIOPSY;  Surgeon: Rogene Houston, MD;  [facility];  Service: Endoscopy;  Laterality: N/A;  Gastric biopsies   ESOPHAGOGASTRODUODENOSCOPY N/A 11/19/2014   Procedure: ESOPHAGOGASTRODUODENOSCOPY (EGD);  Surgeon: Rogene Houston, MD;  Location: [facility];  Service: Endoscopy;  Laterality: N/A;  2:15-rescheduled to 11/9 @1 :65 Ann notified pt   ESOPHAGOGASTRODUODENOSCOPY N/A 02/26/2015   Procedure: ESOPHAGOGASTRODUODENOSCOPY (EGD);  Surgeon: Rogene Houston, MD;  Location: [facility];  Service: Endoscopy;  Laterality: N/A;  1225   Current Outpatient Medications on File Prior to Visit  Medication Sig Dispense Refill   acetaminophen (TYLENOL) 325 MG tablet  Take 2 tablets (650 mg total) by mouth every 6 (six) hours as needed for mild pain (or Fever >/= 101). 12 tablet 0   furosemide (LASIX) 40 MG tablet Take 1 tablet (40 mg total) by mouth daily. 30 tablet 5   Glycopyrrolate-Formoterol (BEVESPI AEROSPHERE) 9-4.8 MCG/ACT AERO Inhale 2 puffs into the lungs 2 (two) times daily. 10.7 g 11   ipratropium-albuterol (DUONEB) 0.5-2.5 (3) MG/3ML SOLN Take 3 mLs by nebulization every 4 (four) hours as needed. 360 mL 1   OXYGEN Inhale 3 L  into the lungs daily. And 3 lpm with exertion     pantoprazole (PROTONIX) 40 MG tablet Take 1 tablet (40 mg total) by mouth in the morning. 30 min prior to breakfast 90 tablet 3   predniSONE (DELTASONE) 20 MG tablet Take 2 tablets (40 mg total) by mouth daily with breakfast. 10 tablet 0   PROVENTIL HFA 108 (90 Base) MCG/ACT inhaler Inhale 2 puffs into the lungs every 4 (four) hours as needed for shortness of breath. 18 g 1   spironolactone (ALDACTONE) 25 MG tablet Take 1 tablet (25 mg total) by mouth daily. 30 tablet 5   traZODone (DESYREL) 50 MG tablet Take 1 tablet (50 mg total) by mouth at bedtime. 30 tablet 2   No current facility-administered medications on file prior to visit.   No Known Allergies Social History   Socioeconomic History   Marital status: Divorced    Spouse name: Not on file   Number of children: Not on file   Years of education: Not on file   Highest education level: Not on file  Occupational History   Occupation: Clinical biochemist   Tobacco Use   Smoking status: Former    Packs/day: 0.25    Years: 51.00    Pack years: 12.75    Types: Cigarettes    Start date: 12/30/1964    Quit date: 05/21/2017    Years since quitting: 3.1   Smokeless tobacco: Never  Vaping Use   Vaping Use: Never used  Substance and Sexual Activity   Alcohol use: No    Alcohol/week: 0.0 standard drinks   Drug use: No   Sexual activity: Yes    Partners: Female  Other Topics Concern   Not on file  Social History Narrative    Not on file   Social Determinants of Health   Financial Resource Strain: Not on file  Food Insecurity: Not on file  Transportation Needs: Not on file  Physical Activity: Not on file  Stress: Not on file  Social Connections: Not on file  Intimate Partner Violence: Not on file      Review of Systems     Objective:   Physical Exam Constitutional:      Appearance: Normal appearance. He is normal weight.  Cardiovascular:     Rate and Rhythm: Normal rate and regular rhythm.     Pulses: Normal pulses.     Heart sounds: Normal heart sounds. No murmur heard.   No gallop.  Pulmonary:     Effort: Pulmonary effort is normal. No respiratory distress.     Breath sounds: Wheezing present. No rhonchi or rales.  Abdominal:     General: Bowel sounds are normal. There is distension.     Palpations: Abdomen is soft.     Tenderness: There is no abdominal tenderness. There is no guarding.  Musculoskeletal:     Right lower leg: Edema present.     Left lower leg: Edema present.  Neurological:     Mental Status: He is alert.         Assessment & Plan:  Chronic GERD - Plan: pantoprazole (PROTONIX) 40 MG tablet  Chronic respiratory failure with hypoxia and hypercapnia (HCC)  Chronic obstructive pulmonary disease, unspecified COPD type ([place])  Acute congestive heart failure, unspecified heart failure type ([place]) Patient appears to be fluid overloaded and shows signs of congestive heart failure.  His last echocardiogram however showed a preserved ejection fraction.  Therefore I believe this is acute diastolic failure.  Begin Lasix 40  mg p.o. twice daily over the weekend and then recheck.  Once he is euvolemic I would resume Lasix 40 mg a day.  Continue Bevespi for his underlying COPD as well as Protonix for his underlying GERD.  His lab work is normal aside from elevations in cholesterol however he declines cholesterol medication at the present time.  He has a severe rash on his back  consistent with psoriasis.  Patient needs to see a dermatologist to begin biologic therapy as he is tried and failed numerous topical corticosteroid creams.  I will consult dermatology

## 2020-08-18 DIAGNOSIS — J449 Chronic obstructive pulmonary disease, unspecified: Secondary | ICD-10-CM | POA: Diagnosis not present

## 2020-08-24 ENCOUNTER — Telehealth: Payer: Self-pay | Admitting: Family Medicine

## 2020-08-24 DIAGNOSIS — L400 Psoriasis vulgaris: Secondary | ICD-10-CM | POA: Diagnosis not present

## 2020-08-24 NOTE — Telephone Encounter (Signed)
Call placed to patient to advise.   Reports that he currently sees a dermatologist in [place], Dr. Nevada Crane.

## 2020-08-24 NOTE — Telephone Encounter (Signed)
Received call from [place] with [facility]; called to report that patient doesn't want to schedule an appt with their office. Patient stated he didn't know why he was referred to their office.   Please advise Caitlyn with questions at [phone], ext [phone].

## 2020-08-30 DIAGNOSIS — J449 Chronic obstructive pulmonary disease, unspecified: Secondary | ICD-10-CM | POA: Diagnosis not present

## 2020-09-03 ENCOUNTER — Ambulatory Visit: Payer: Medicare Other | Admitting: Family Medicine

## 2020-09-18 ENCOUNTER — Encounter (INDEPENDENT_AMBULATORY_CARE_PROVIDER_SITE_OTHER): Payer: Self-pay

## 2020-09-18 DIAGNOSIS — J449 Chronic obstructive pulmonary disease, unspecified: Secondary | ICD-10-CM | POA: Diagnosis not present

## 2020-09-21 ENCOUNTER — Ambulatory Visit (INDEPENDENT_AMBULATORY_CARE_PROVIDER_SITE_OTHER): Payer: Medicare Other | Admitting: Gastroenterology

## 2020-09-21 ENCOUNTER — Encounter (INDEPENDENT_AMBULATORY_CARE_PROVIDER_SITE_OTHER): Payer: Self-pay | Admitting: Gastroenterology

## 2020-09-28 ENCOUNTER — Other Ambulatory Visit: Payer: Self-pay

## 2020-09-28 ENCOUNTER — Encounter: Payer: Self-pay | Admitting: Internal Medicine

## 2020-09-28 ENCOUNTER — Ambulatory Visit: Payer: Medicare Other | Admitting: Internal Medicine

## 2020-09-28 DIAGNOSIS — J9611 Chronic respiratory failure with hypoxia: Secondary | ICD-10-CM

## 2020-09-28 DIAGNOSIS — J449 Chronic obstructive pulmonary disease, unspecified: Secondary | ICD-10-CM | POA: Diagnosis not present

## 2020-09-28 DIAGNOSIS — J9612 Chronic respiratory failure with hypercapnia: Secondary | ICD-10-CM

## 2020-09-28 MED ORDER — PREDNISONE 10 MG PO TABS: ORAL_TABLET | ORAL | 0 refills | Status: DC

## 2020-09-28 NOTE — Patient Instructions (Signed)
Prednisone 10 mg take  4 each am x 2 days,   2 each am x 2 days,  1 each am x 2 days and stop   Make sure you check your oxygen saturation  at your highest level of activity  to be sure it stays over 90% and adjust  02 flow upward to maintain this level if needed but remember to turn it back to previous settings when you stop (to conserve your supply).    Please remember to go to the lab department   for your tests - we will call you with the results when they are available.      Please schedule a follow up visit in 6 months but call sooner if needed

## 2020-09-28 NOTE — Progress Notes (Signed)
Subjective:    Patient ID: Randall Sawyer, male    DOB: Mar 14, 1955     MRN: [id number]    Brief patient profile:  70  yowm quit smoking 05/2017  first noticed doe x around 2005 first on just albuterol then added spiriva then symbicort then stiolto which works the best so far and referred to pulmonary clinic 05/27/2014 by Dr Dennard Schaumann for copd eval and proved to have a GOLD IV severity  07/23/14 with hypercarbia.   History of Present Illness  05/27/2014 1st [facility] office visit/ Randall Sawyer   Chief Complaint  Patient presents with   Pulmonary Consult    referred by Dr. Dennard Schaumann SOB x 1 year. C/o of SOB, fluid retention, prod cough with yellow thick mucus.  Wheezing late in the evenings. Left sided abdominal pain.   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                                      spiriva 2 puffs each am  Plan B = Back up - Only use your albuterol as a rescue medication  Plan C = crisis - Your albuterol nebulizer can be used up to every  4 hours with the goal of not needing at all eventually  Prednisone 10 mg take  4 each am x 2 days,   2 each am x 2 days,  1 each am x 2 days and stop      06/11/2014 f/u ov/Randall Sawyer re: copd still smoking   Chief Complaint  Patient presents with   Follow-up    fluid retention not as bad; cough; chest tightness;states here for paperwork;  last used ventolin 1 hours - has not tried rechallenge/ sleeps ok  Just walking to mb makes him stop  one half way going both ways though uphill to it.  Not needing neb as much  No noct or early am exac  Has not worked since 05/27/14 rec Plan A =  Automatic =  Symbicort 160 Take 2 puffs first thing in am and then another 2 puffs about 12 hours later.                                      spiriva 2 puffs each am     Plan B = Back up - Only use your albuterol as a rescue medication Plan C = crisis - Your albuterol nebulizer can be used up to every  4 hours with the goal of not needing at all eventually  The key is to stop smoking completely before smoking completely stops you!    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 He has good days and bad days. He uses his albuterol inhaler once daily on average. He rarely uses neb.   Dyspnea:  [employer] shopping on 3lpm  Cough: min mucoid  Sleeping: 1lpm / bed / 3 pillows  SABA use: every evening p supper  02: 1lpm at rest, 3lpm with activity   rec No change rx   05/28/2018  f/u ov/Randall Sawyer re:  GOLD IV/ still off 02 and maintained on Bevespi and 02 as below Chief Complaint  Patient presents with   Follow-up    Breathing is unchanged and no new co's. He is using his rescue inhaler once per wk on average. He rarely uses neb.    Dyspnea:  Not doing much walking since virus Cough: none Sleeping: on side bed is flat/two pillows SABA use: rarely needs hfa and never neb  02: 2lpm hs none sitting with sats 92 and up to 3lpm when walking at 95%  rec No change in recommendations but you need to walk more  Please schedule a follow up visit in 6 months but call sooner if needed  - discuss low dose screening program p covid restrictions lifted     12/11/2019  f/u ov/[place] office/Randall Sawyer re: bevespi  Chief Complaint  Patient presents with    Follow-up    Breathing is doing well today. He has good days and bad days. He states that he has noticed panic attacks over the past 6 months relates to his breathing.   Dyspnea:  Walking at [employer] about the same  Cough: minimal am congestion/ mucoid Sleeping: bed is flat / lots of pillows = baseline SABA use: once or twice weekly  - neb every 3-4 hours  02: 3lpm hs /  At rest 0,  Walk 2lpm  Does not check sats  Rec Make sure you check your oxygen saturations at highest level of activity to be sure it stays over 90% and adjust  02 flow upward to maintain this level if needed but remember to turn it back to previous settings when you stop (to conserve your supply).  Needs alpha one    09/28/2020  f/u ov/[place] office/Randall Sawyer re: GOLD IV / 02 dep maint on 3lpm 24/7 but sometimes low 90s  on bevespi / off aldactone  Chief Complaint  Patient presents with   Follow-up    Increased SOB consistently. 3L O2 all of the time.    Dyspnea:  worse x 6 weeks / still walking at [employer]/ 1 aisle s stopping  Cough: none  Sleeping: flat bed, 2 pillows  SABA use: 1-2 per day 02: 3lpm doesn't titate  Covid status: vax x 2 and probably infected      No obvious day to day or daytime variability or assoc excess/ purulent sputum or mucus plugs or hemoptysis or cp or chest tightness, subjective wheeze or overt sinus or hb symptoms.   Sleeping  without nocturnal  or early am exacerbation  of respiratory  c/o's or need for noct saba. Also denies any obvious fluctuation of symptoms with weather or environmental changes or other aggravating or alleviating factors except as outlined above   No unusual exposure hx or h/o childhood pna/ asthma or knowledge of premature birth.  Current Allergies, Complete  Past Medical History, Past Surgical History, Family History, and Social History were reviewed in [employer] record.  ROS  The following are not active complaints unless bolded Hoarseness,  sore throat, dysphagia, dental problems, itching, sneezing,  nasal congestion or discharge of excess mucus or purulent secretions, ear ache,   fever, chills, sweats, unintended wt loss or wt gain, classically pleuritic or exertional cp,  orthopnea pnd or arm/hand swelling  or leg swelling but improved over baseline, presyncope, palpitations, abdominal pain, anorexia, nausea, vomiting, diarrhea  or change in bowel habits or change in bladder habits, change in stools or change in urine, dysuria, hematuria,  rash, arthralgias, visual complaints, headache, numbness, weakness or ataxia or problems with walking or coordination,  change in mood or  memory.        Current Meds  Medication Sig   acetaminophen (TYLENOL) 325 MG tablet Take 2 tablets (650 mg total) by mouth every 6 (six) hours as needed for mild pain (or Fever >/= 101).   furosemide (LASIX) 40 MG tablet Take 1 tablet (40 mg total) by mouth daily.   Glycopyrrolate-Formoterol (BEVESPI AEROSPHERE) 9-4.8 MCG/ACT AERO Inhale 2 puffs into the lungs 2 (two) times daily.   ipratropium-albuterol (DUONEB) 0.5-2.5 (3) MG/3ML SOLN Take 3 mLs by nebulization every 4 (four) hours as needed.   OXYGEN Inhale 3 L into the lungs daily. And 3 lpm with exertion   pantoprazole (PROTONIX) 40 MG tablet Take 1 tablet (40 mg total) by mouth in the morning. 30 min prior to breakfast   PROVENTIL HFA 108 (90 Base) MCG/ACT inhaler Inhale 2 puffs into the lungs every 4 (four) hours as needed for shortness of breath.   spironolactone (ALDACTONE) 25 MG tablet Take 1 tablet (25 mg total) by mouth daily.                   Objective:   Physical Exam  09/28/2020       228  12/11/2019       234 07/03/2019       227  12/03/2018     231  06/11/2014         179 >  07/23/2014   184 > 09/03/2014 185 > 12/01/2014  196 > 03/03/2015  190 > 08/31/2015     192 > 09/28/2015  195  > 11/09/2015   186  > 02/09/2016     189> 11/15/2016  198 >  03/02/2017  204 > 07/04/2017  201> 10/04/2017 213  >  05/28/2018   225     05/27/14 180 lb (81.647 kg)  05/16/14 176 lb (79.833 kg)  05/09/14 180 lb (81.647 kg)       Report full dentures   Vital signs reviewed  09/28/2020  - Note at rest 02 sats  94% on 3 lpm   General appearance:    chronically ill elderly amb wm nad   HEENT : pt wearing mask not removed for exam due to covid -19 concerns.    NECK :  without JVD/Nodes/TM/ nl carotid upstrokes bilaterally   LUNGS: no acc muscle use,  Mod barrel  contour chest wall with bilateral  Distant bs s audible wheeze and  without cough on insp or exp maneuvers and mod  Hyperresonant  to  percussion bilaterally     CV:  RRR  no s3 or murmur or increase in P2, and 1+ sym lower Ext edema   ABD:  mod distended soft and nontender with pos mid insp  Hoover's  in the supine position. No bruits or organomegaly appreciated, bowel sounds nl  MS:     ext warm without deformities, calf tenderness, cyanosis or clubbing No obvious joint restrictions   SKIN: warm and dry without lesions    NEURO:  alert, approp, nl sensorium with  no motor or cerebellar deficits apparent.

## 2020-09-28 NOTE — Assessment & Plan Note (Signed)
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/21/17 at resection of R rib chondrosarcoma - 05/28/2018   continue bevespi 2 bid   - 09/28/2020  After extensive coaching inhaler device,  effectiveness =    75% (short Ti)   -  09/28/2020  :   alpha one AT phenotype >>>   ? Mild flare on bevespi > rx prednisone and consider change to breztri 2bid if insurance covers

## 2020-09-28 NOTE — Assessment & Plan Note (Signed)
HC03  36  05/16/14 corresponding to a pc02 above 55  - ono RA < 89% x 4 h 11 min > 09/12/2014  rec one liter per min hs and recheck = > done 9/141/6 with < 89% x 25m > no change rx  - HC03  11/09/2015  = 39  - HCO3 02/09/2016    = 40  - HC03  11/15/2016    = 36  - 07/04/2017  Walked RA x 3 laps @ 185 ft each stopped due to  desat to 85% then completed 3rd lap nl pace on 3lpm POC pulsed  - 12/03/2018   Walked 2lpmx two laps =  approx 551ft @ moderate pace - stopped due to end of study with sats of 91% at the end of the study but sob so rec 3lpm at highest level of activity with goal > 90% and help burn fat to get into neg cal balance  -  12/11/2019   Walked RA  approx   200 ft  @ avg pace  stopped due to  Sob/tired with sats still 92% but on RA only 87% at rest vs 90% on 0.5 lpm  - 09/28/2020 reports sats at rest low 90s  Advised: Make sure you check your oxygen saturation  at your highest level of activity  to be sure it stays over 90% and adjust  02 flow upward to maintain this level if needed but remember to turn it back to previous settings when you stop (to conserve your supply).          Each maintenance medication was reviewed in detail including emphasizing most importantly the difference between maintenance and prns and under what circumstances the prns are to be triggered using an action plan format where appropriate.  Total time for H and P, chart review, counseling, reviewing hfa/ 02 device(s) and generating customized AVS unique to this office visit / same day charting =  25 min

## 2020-09-30 DIAGNOSIS — J449 Chronic obstructive pulmonary disease, unspecified: Secondary | ICD-10-CM | POA: Diagnosis not present

## 2020-09-30 LAB — ALPHA-1-ANTITRYPSIN PHENOTYP: A-1 Antitrypsin: 170 mg/dL (ref 101–187)

## 2020-10-18 DIAGNOSIS — J449 Chronic obstructive pulmonary disease, unspecified: Secondary | ICD-10-CM | POA: Diagnosis not present

## 2020-10-30 DIAGNOSIS — J449 Chronic obstructive pulmonary disease, unspecified: Secondary | ICD-10-CM | POA: Diagnosis not present

## 2020-10-30 DIAGNOSIS — R911 Solitary pulmonary nodule: Secondary | ICD-10-CM | POA: Diagnosis not present

## 2020-10-30 DIAGNOSIS — C413 Malignant neoplasm of ribs, sternum and clavicle: Secondary | ICD-10-CM | POA: Diagnosis not present

## 2020-11-11 DIAGNOSIS — Z888 Allergy status to other drugs, medicaments and biological substances status: Secondary | ICD-10-CM | POA: Diagnosis not present

## 2020-11-11 DIAGNOSIS — R911 Solitary pulmonary nodule: Secondary | ICD-10-CM | POA: Diagnosis not present

## 2020-11-12 ENCOUNTER — Ambulatory Visit: Payer: Medicare Other

## 2020-11-18 DIAGNOSIS — J449 Chronic obstructive pulmonary disease, unspecified: Secondary | ICD-10-CM | POA: Diagnosis not present

## 2020-11-19 DIAGNOSIS — R911 Solitary pulmonary nodule: Secondary | ICD-10-CM | POA: Diagnosis not present

## 2020-11-19 DIAGNOSIS — R918 Other nonspecific abnormal finding of lung field: Secondary | ICD-10-CM | POA: Diagnosis not present

## 2020-11-20 ENCOUNTER — Other Ambulatory Visit: Payer: Self-pay | Admitting: Family Medicine

## 2020-11-20 ENCOUNTER — Other Ambulatory Visit: Payer: Self-pay

## 2020-11-20 MED ORDER — PROVENTIL HFA 108 (90 BASE) MCG/ACT IN AERS: 2.0000 | INHALATION_SPRAY | RESPIRATORY_TRACT | 1 refills | Status: DC | PRN

## 2020-11-20 NOTE — Telephone Encounter (Signed)
Pt called in requesting a refill of PROVENTIL HFA 108 (90 Base), sent to pharmacy  Cb#: [phone]

## 2020-11-30 DIAGNOSIS — R911 Solitary pulmonary nodule: Secondary | ICD-10-CM | POA: Diagnosis not present

## 2020-11-30 DIAGNOSIS — J449 Chronic obstructive pulmonary disease, unspecified: Secondary | ICD-10-CM | POA: Diagnosis not present

## 2020-12-08 DIAGNOSIS — R911 Solitary pulmonary nodule: Secondary | ICD-10-CM | POA: Diagnosis not present

## 2020-12-18 DIAGNOSIS — J449 Chronic obstructive pulmonary disease, unspecified: Secondary | ICD-10-CM | POA: Diagnosis not present

## 2020-12-30 DIAGNOSIS — J449 Chronic obstructive pulmonary disease, unspecified: Secondary | ICD-10-CM | POA: Diagnosis not present

## 2021-01-01 DIAGNOSIS — R911 Solitary pulmonary nodule: Secondary | ICD-10-CM | POA: Diagnosis not present

## 2021-01-01 DIAGNOSIS — J9611 Chronic respiratory failure with hypoxia: Secondary | ICD-10-CM | POA: Diagnosis not present

## 2021-01-01 DIAGNOSIS — J449 Chronic obstructive pulmonary disease, unspecified: Secondary | ICD-10-CM | POA: Diagnosis not present

## 2021-01-01 DIAGNOSIS — C413 Malignant neoplasm of ribs, sternum and clavicle: Secondary | ICD-10-CM | POA: Diagnosis not present

## 2021-01-05 ENCOUNTER — Other Ambulatory Visit: Payer: Self-pay

## 2021-01-05 ENCOUNTER — Telehealth: Payer: Self-pay | Admitting: Internal Medicine

## 2021-01-05 ENCOUNTER — Other Ambulatory Visit: Payer: Self-pay | Admitting: Family Medicine

## 2021-01-05 MED ORDER — PROVENTIL HFA 108 (90 BASE) MCG/ACT IN AERS: 2.0000 | INHALATION_SPRAY | RESPIRATORY_TRACT | 11 refills | Status: DC | PRN

## 2021-01-05 NOTE — Telephone Encounter (Signed)
Albuterol inhaler refilled.   Called and notified patient.

## 2021-01-06 ENCOUNTER — Other Ambulatory Visit: Payer: Self-pay

## 2021-01-06 MED ORDER — BEVESPI AEROSPHERE 9-4.8 MCG/ACT IN AERO: 2.0000 | INHALATION_SPRAY | Freq: Two times a day (BID) | RESPIRATORY_TRACT | 11 refills | Status: DC

## 2021-01-06 NOTE — Telephone Encounter (Signed)
Bevespi refilled and sent to [facility] in [place].   Called patient and notified.

## 2021-01-07 ENCOUNTER — Other Ambulatory Visit: Payer: Self-pay

## 2021-01-07 ENCOUNTER — Ambulatory Visit (INDEPENDENT_AMBULATORY_CARE_PROVIDER_SITE_OTHER): Payer: Medicare Other | Admitting: Family Medicine

## 2021-01-07 VITALS — BP 142/68 | HR 94 | Ht 68.0 in | Wt 239.0 lb

## 2021-01-07 DIAGNOSIS — J9611 Chronic respiratory failure with hypoxia: Secondary | ICD-10-CM

## 2021-01-07 DIAGNOSIS — I2781 Cor pulmonale (chronic): Secondary | ICD-10-CM | POA: Diagnosis not present

## 2021-01-07 DIAGNOSIS — J449 Chronic obstructive pulmonary disease, unspecified: Secondary | ICD-10-CM

## 2021-01-07 DIAGNOSIS — J9612 Chronic respiratory failure with hypercapnia: Secondary | ICD-10-CM

## 2021-01-07 DIAGNOSIS — M7989 Other specified soft tissue disorders: Secondary | ICD-10-CM

## 2021-01-07 DIAGNOSIS — C413 Malignant neoplasm of ribs, sternum and clavicle: Secondary | ICD-10-CM

## 2021-01-07 NOTE — Progress Notes (Signed)
Subjective:    Patient ID: Randall Sawyer, male    DOB: 12/16/1955, 65 y.o.   MRN: [id number]  HPI  Patient is here today for preoperative evaluation.  He has a history of severe COPD and is oxygen dependent.  For this he is on Bevespi 2 puffs inhaled twice daily.  He is not yet on triple therapy.  He denies seeing his pulmonologist recently.  He also has a history of cor pulmonale/right-sided heart failure secondary to his underlying lung issues.  He is currently on Lasix 40 mg a day for body swelling.  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  Originally it was on his right rib.  It has returned now on his left rib.  He is scheduled to have surgery to remove this on January 12.  However he is here today for preoperative evaluation.  He states that they would like to try to "remove some fluid" prior to his surgery.  On examination, he does show swelling in his face.  His abdomen is distended however there is only trace bipedal edema.  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, 4.5 cm, grade 2   COPD (chronic obstructive pulmonary disease) (HCC)    Hypertension    Nephrolithiasis    Tobacco abuse    Past Surgical History:  Procedure Laterality Date   BIOPSY N/A 02/26/2015   Procedure: BIOPSY;  Surgeon: Rogene Houston, MD;  [facility];  Service: Endoscopy;  Laterality: N/A;  Gastric biopsies   ESOPHAGOGASTRODUODENOSCOPY N/A 11/19/2014   Procedure: ESOPHAGOGASTRODUODENOSCOPY (EGD);  Surgeon: Rogene Houston, MD;  [facility];  Service: Endoscopy;  Laterality: N/A;  2:15-rescheduled to 11/9  @1 :45 Ann notified pt   ESOPHAGOGASTRODUODENOSCOPY N/A 02/26/2015   Procedure: ESOPHAGOGASTRODUODENOSCOPY (EGD);  Surgeon: Rogene Houston, MD;  [facility];  Service: Endoscopy;  Laterality: N/A;  1225   Current Outpatient Medications on File Prior to Visit  Medication Sig Dispense Refill   acetaminophen (TYLENOL) 325 MG tablet Take 2 tablets (650 mg total) by mouth every 6 (six) hours as needed for mild pain (or Fever >/= 101). 12 tablet 0   furosemide (LASIX) 40 MG tablet TAKE 1 TABLET(40 MG) BY MOUTH DAILY 90 tablet 1   Glycopyrrolate-Formoterol (BEVESPI AEROSPHERE) 9-4.8 MCG/ACT AERO Inhale 2 puffs into the lungs 2 (two) times daily. 10.7 g 11   ipratropium-albuterol (DUONEB) 0.5-2.5 (3) MG/3ML SOLN Take 3 mLs by nebulization every 4 (four) hours as needed. 360 mL 1   OXYGEN Inhale 3 L into the lungs daily. And 3 lpm with exertion     pantoprazole (PROTONIX) 40 MG tablet Take 1 tablet (40 mg total) by mouth in the morning. 30 min prior to breakfast 90 tablet 1   PROVENTIL HFA 108 (90 Base) MCG/ACT inhaler Inhale 2 puffs into the lungs every 4 (four) hours as needed for shortness of breath. 46 g 11   triamcinolone cream (KENALOG) 0.1 %      spironolactone (ALDACTONE) 25 MG tablet Take 1 tablet (25 mg total) by mouth daily. (Patient not taking: Reported on 01/07/2021) 30 tablet  5   No current facility-administered medications on file prior to visit.   No Known Allergies Social History   Socioeconomic History   Marital status: Divorced    Spouse name: Not on file   Number of children: Not on file   Years of education: Not on file   Highest education level: Not on file  Occupational History   Occupation: Clinical biochemist   Tobacco Use   Smoking status: Former    Packs/day: 0.25    Years: 51.00    Pack years: 12.75    Types: Cigarettes    Start date: 12/30/1964    Quit date: 05/21/2017    Years since quitting: 3.6   Smokeless tobacco: Never  Vaping Use   Vaping Use: Never  used  Substance and Sexual Activity   Alcohol use: No    Alcohol/week: 0.0 standard drinks   Drug use: No   Sexual activity: Yes    Partners: Female  Other Topics Concern   Not on file  Social History Narrative   Not on file   Social Determinants of Health   Financial Resource Strain: Not on file  Food Insecurity: Not on file  Transportation Needs: Not on file  Physical Activity: Not on file  Stress: Not on file  Social Connections: Not on file  Intimate Partner Violence: Not on file      Review of Systems     Objective:   Physical Exam Constitutional:      Appearance: Normal appearance. He is normal weight.  Cardiovascular:     Rate and Rhythm: Normal rate and regular rhythm.     Pulses: Normal pulses.     Heart sounds: Normal heart sounds. No murmur heard.   No gallop.  Pulmonary:     Effort: Pulmonary effort is normal. No respiratory distress.     Breath sounds: Wheezing present. No rhonchi or rales.  Abdominal:     General: Bowel sounds are normal. There is distension.     Palpations: Abdomen is soft.     Tenderness: There is no abdominal tenderness. There is no guarding.  Musculoskeletal:     Right lower leg: Edema present.     Left lower leg: Edema present.  Neurological:     Mental Status: He is alert.         Assessment & Plan:  Leg swelling - Plan: Brain natriuretic peptide, CBC with Differential/Platelet, COMPLETE METABOLIC PANEL WITH GFR  Chronic respiratory failure with hypoxia and hypercapnia (HCC)  Chronic obstructive pulmonary disease, unspecified COPD type (HCC)  Chondrosarcoma of ribs (HCC)  Cor pulmonale (chronic) ([place]) Unfortunately, patient seems to be somewhat euvolemic today.  He does have some trace bipedal edema but there is no obvious anasarca.  I will obtain a BNP today and if significantly elevated we can certainly uptitrate his Lasix however I feel that he is most likely euvolemic.  I do feel that we can maximize therapy for  COPD by switching the patient to triple therapy such as Breztri instead of Bevespi.  Also obtain baseline lab work including CBC and CMP.  Await the results of the labs to determine if increase diuretic is necessary.

## 2021-01-08 LAB — CBC WITH DIFFERENTIAL/PLATELET
Absolute Monocytes: 738 cells/uL (ref 200–950)
Basophils Absolute: 42 cells/uL (ref 0–200)
Basophils Relative: 0.4 %
Eosinophils Absolute: 437 cells/uL (ref 15–500)
Eosinophils Relative: 4.2 %
HCT: 33 % — ABNORMAL LOW (ref 38.5–50.0)
Hemoglobin: 9.9 g/dL — ABNORMAL LOW (ref 13.2–17.1)
Lymphs Abs: 1009 cells/uL (ref 850–3900)
MCH: 25.2 pg — ABNORMAL LOW (ref 27.0–33.0)
MCHC: 30 g/dL — ABNORMAL LOW (ref 32.0–36.0)
MCV: 84 fL (ref 80.0–100.0)
MPV: 10.8 fL (ref 7.5–12.5)
Monocytes Relative: 7.1 %
Neutro Abs: 8174 cells/uL — ABNORMAL HIGH (ref 1500–7800)
Neutrophils Relative %: 78.6 %
Platelets: 336 10*3/uL (ref 140–400)
RBC: 3.93 10*6/uL — ABNORMAL LOW (ref 4.20–5.80)
RDW: 17.5 % — ABNORMAL HIGH (ref 11.0–15.0)
Total Lymphocyte: 9.7 %
WBC: 10.4 10*3/uL (ref 3.8–10.8)

## 2021-01-08 LAB — COMPLETE METABOLIC PANEL WITH GFR
AG Ratio: 1.1 (calc) (ref 1.0–2.5)
ALT: 5 U/L — ABNORMAL LOW (ref 9–46)
AST: 11 U/L (ref 10–35)
Albumin: 3.8 g/dL (ref 3.6–5.1)
Alkaline phosphatase (APISO): 101 U/L (ref 35–144)
BUN: 10 mg/dL (ref 7–25)
CO2: 39 mmol/L — ABNORMAL HIGH (ref 20–32)
Calcium: 8.9 mg/dL (ref 8.6–10.3)
Chloride: 89 mmol/L — ABNORMAL LOW (ref 98–110)
Creat: 1.15 mg/dL (ref 0.70–1.35)
Globulin: 3.6 g/dL (calc) (ref 1.9–3.7)
Glucose, Bld: 92 mg/dL (ref 65–99)
Potassium: 4.7 mmol/L (ref 3.5–5.3)
Sodium: 134 mmol/L — ABNORMAL LOW (ref 135–146)
Total Bilirubin: 0.4 mg/dL (ref 0.2–1.2)
Total Protein: 7.4 g/dL (ref 6.1–8.1)
eGFR: 70 mL/min/{1.73_m2} (ref >=60)

## 2021-01-08 LAB — BRAIN NATRIURETIC PEPTIDE: Brain Natriuretic Peptide: 30 pg/mL (ref <100)

## 2021-01-12 ENCOUNTER — Other Ambulatory Visit: Payer: Medicare Other

## 2021-01-12 ENCOUNTER — Telehealth: Payer: Self-pay | Admitting: Family Medicine

## 2021-01-12 DIAGNOSIS — E8770 Fluid overload, unspecified: Secondary | ICD-10-CM

## 2021-01-12 NOTE — Telephone Encounter (Signed)
Patient called to receive results of lab tests taken last week.  Please advise at [phone]

## 2021-01-12 NOTE — Telephone Encounter (Signed)
Spoke with patient regarding results and recommendations.  Lab appointment scheduled and order place.

## 2021-01-15 ENCOUNTER — Other Ambulatory Visit: Payer: Self-pay

## 2021-01-15 ENCOUNTER — Other Ambulatory Visit: Payer: Medicare Other

## 2021-01-15 DIAGNOSIS — M7989 Other specified soft tissue disorders: Secondary | ICD-10-CM

## 2021-01-16 LAB — BASIC METABOLIC PANEL
BUN: 11 mg/dL (ref 7–25)
CO2: 38 mmol/L — ABNORMAL HIGH (ref 20–32)
Calcium: 8.9 mg/dL (ref 8.6–10.3)
Chloride: 88 mmol/L — ABNORMAL LOW (ref 98–110)
Creat: 1.33 mg/dL (ref 0.70–1.35)
Glucose, Bld: 100 mg/dL — ABNORMAL HIGH (ref 65–99)
Potassium: 4.6 mmol/L (ref 3.5–5.3)
Sodium: 135 mmol/L (ref 135–146)

## 2021-01-18 ENCOUNTER — Telehealth: Payer: Self-pay

## 2021-01-18 ENCOUNTER — Other Ambulatory Visit: Payer: Self-pay | Admitting: Family Medicine

## 2021-01-18 ENCOUNTER — Telehealth: Payer: Self-pay | Admitting: Internal Medicine

## 2021-01-18 DIAGNOSIS — J449 Chronic obstructive pulmonary disease, unspecified: Secondary | ICD-10-CM | POA: Diagnosis not present

## 2021-01-18 MED ORDER — BREZTRI AEROSPHERE 160-9-4.8 MCG/ACT IN AERO: 2.0000 | INHALATION_SPRAY | Freq: Two times a day (BID) | RESPIRATORY_TRACT | 6 refills | Status: DC

## 2021-01-18 NOTE — Telephone Encounter (Signed)
Patient aware of results and recommendations. °

## 2021-01-18 NOTE — Telephone Encounter (Signed)
Called and spoke with patient who would like RX sent in for [place]. He verified preferred pharmacy. RX sent. Nothing further needed at this time.

## 2021-01-18 NOTE — Telephone Encounter (Signed)
-----   Message from Susy Frizzle, MD sent at 01/18/2021  7:08 AM EST ----- Kidney function is stable on higher dose of Lasix.  Continue current dose of Lasix

## 2021-01-19 DIAGNOSIS — Z01810 Encounter for preprocedural cardiovascular examination: Secondary | ICD-10-CM | POA: Diagnosis not present

## 2021-01-19 DIAGNOSIS — R911 Solitary pulmonary nodule: Secondary | ICD-10-CM | POA: Diagnosis not present

## 2021-01-19 DIAGNOSIS — C413 Malignant neoplasm of ribs, sternum and clavicle: Secondary | ICD-10-CM | POA: Diagnosis not present

## 2021-01-21 ENCOUNTER — Telehealth: Payer: Self-pay | Admitting: Family Medicine

## 2021-01-21 DIAGNOSIS — Z87891 Personal history of nicotine dependence: Secondary | ICD-10-CM | POA: Diagnosis not present

## 2021-01-21 DIAGNOSIS — I5030 Unspecified diastolic (congestive) heart failure: Secondary | ICD-10-CM | POA: Diagnosis not present

## 2021-01-21 DIAGNOSIS — R911 Solitary pulmonary nodule: Secondary | ICD-10-CM | POA: Diagnosis not present

## 2021-01-21 DIAGNOSIS — C3432 Malignant neoplasm of lower lobe, left bronchus or lung: Secondary | ICD-10-CM | POA: Diagnosis not present

## 2021-01-21 DIAGNOSIS — C413 Malignant neoplasm of ribs, sternum and clavicle: Secondary | ICD-10-CM | POA: Diagnosis not present

## 2021-01-21 DIAGNOSIS — Z9981 Dependence on supplemental oxygen: Secondary | ICD-10-CM | POA: Diagnosis not present

## 2021-01-21 DIAGNOSIS — Z79899 Other long term (current) drug therapy: Secondary | ICD-10-CM | POA: Diagnosis not present

## 2021-01-21 DIAGNOSIS — D649 Anemia, unspecified: Secondary | ICD-10-CM | POA: Diagnosis not present

## 2021-01-21 DIAGNOSIS — Z7901 Long term (current) use of anticoagulants: Secondary | ICD-10-CM | POA: Diagnosis not present

## 2021-01-21 DIAGNOSIS — J449 Chronic obstructive pulmonary disease, unspecified: Secondary | ICD-10-CM | POA: Diagnosis not present

## 2021-01-21 DIAGNOSIS — K219 Gastro-esophageal reflux disease without esophagitis: Secondary | ICD-10-CM | POA: Diagnosis not present

## 2021-01-21 DIAGNOSIS — D0220 Carcinoma in situ of unspecified bronchus and lung: Secondary | ICD-10-CM | POA: Diagnosis not present

## 2021-01-21 NOTE — Telephone Encounter (Signed)
I attempted to leave  message for patient to call back and schedule Medicare Annual Wellness Visit (AWV) in office. No voice mail.  If not able to come in office, please offer to do virtually or by telephone.  Left office number and my jabber #[phone].  Due for AWVI   Please schedule at anytime with Nurse Health Advisor.   

## 2021-01-29 DIAGNOSIS — R911 Solitary pulmonary nodule: Secondary | ICD-10-CM | POA: Diagnosis not present

## 2021-01-29 DIAGNOSIS — C413 Malignant neoplasm of ribs, sternum and clavicle: Secondary | ICD-10-CM | POA: Diagnosis not present

## 2021-01-30 DIAGNOSIS — J449 Chronic obstructive pulmonary disease, unspecified: Secondary | ICD-10-CM | POA: Diagnosis not present

## 2021-02-04 DIAGNOSIS — C3492 Malignant neoplasm of unspecified part of left bronchus or lung: Secondary | ICD-10-CM | POA: Insufficient documentation

## 2021-02-05 DIAGNOSIS — Z9981 Dependence on supplemental oxygen: Secondary | ICD-10-CM | POA: Diagnosis not present

## 2021-02-05 DIAGNOSIS — C3411 Malignant neoplasm of upper lobe, right bronchus or lung: Secondary | ICD-10-CM | POA: Insufficient documentation

## 2021-02-05 DIAGNOSIS — C3492 Malignant neoplasm of unspecified part of left bronchus or lung: Secondary | ICD-10-CM | POA: Diagnosis not present

## 2021-02-05 DIAGNOSIS — Z87891 Personal history of nicotine dependence: Secondary | ICD-10-CM | POA: Diagnosis not present

## 2021-02-05 DIAGNOSIS — C3432 Malignant neoplasm of lower lobe, left bronchus or lung: Secondary | ICD-10-CM | POA: Diagnosis not present

## 2021-02-09 ENCOUNTER — Telehealth: Payer: Self-pay | Admitting: Family Medicine

## 2021-02-09 NOTE — Chronic Care Management (AMB) (Signed)
°  Chronic Care Management   Outreach Note  02/09/2021 Name: Randall Sawyer MRN: [id number] DOB: April 13, 1955  Referred by: Susy Frizzle, MD Reason for referral : No chief complaint on file.   An unsuccessful telephone outreach was attempted today. The patient was referred to the pharmacist for assistance with care management and care coordination.   Follow Up Plan:   Tatjana Dellinger Upstream Scheduler

## 2021-02-12 ENCOUNTER — Telehealth: Payer: Self-pay | Admitting: Family Medicine

## 2021-02-12 NOTE — Chronic Care Management (AMB) (Signed)
°  Chronic Care Management   Outreach Note  02/12/2021 Name: Randall Sawyer MRN: [id number] DOB: 1955-06-09  Referred by: Susy Frizzle, MD Reason for referral : No chief complaint on file.   A second unsuccessful telephone outreach was attempted today. The patient was referred to pharmacist for assistance with care management and care coordination.  Follow Up Plan:   Tatjana Dellinger Upstream Scheduler

## 2021-02-12 NOTE — Progress Notes (Signed)
°  Chronic Care Management   Note  02/12/2021 Name: Randall Sawyer MRN: [id number] DOB: [birth year]  Randall Sawyer is a 66 y.o. year old male who is a primary care patient of [name], Cammie Mcgee, MD. I reached out to Randall Sawyer by phone today in response to a referral sent by Randall Sawyer's PCP, Susy Frizzle, MD.   [name] was given information about Chronic Care Management services today including:  CCM service includes personalized support from designated clinical staff supervised by his physician, including individualized plan of care and coordination with other care providers 24/7 contact phone numbers for assistance for urgent and routine care needs. Service will only be billed when office clinical staff spend 20 minutes or more in a month to coordinate care. Only one practitioner may furnish and bill the service in a calendar month. The patient may stop CCM services at any time (effective at the end of the month) by phone call to the office staff.   Patient agreed to services and verbal consent obtained.   Follow up plan:   Tatjana Secretary/administrator

## 2021-02-15 ENCOUNTER — Other Ambulatory Visit: Payer: Self-pay | Admitting: Family Medicine

## 2021-02-15 DIAGNOSIS — K219 Gastro-esophageal reflux disease without esophagitis: Secondary | ICD-10-CM

## 2021-02-18 DIAGNOSIS — J449 Chronic obstructive pulmonary disease, unspecified: Secondary | ICD-10-CM | POA: Diagnosis not present

## 2021-02-19 DIAGNOSIS — C3482 Malignant neoplasm of overlapping sites of left bronchus and lung: Secondary | ICD-10-CM | POA: Diagnosis not present

## 2021-02-19 DIAGNOSIS — C3492 Malignant neoplasm of unspecified part of left bronchus or lung: Secondary | ICD-10-CM | POA: Diagnosis not present

## 2021-02-19 DIAGNOSIS — C3432 Malignant neoplasm of lower lobe, left bronchus or lung: Secondary | ICD-10-CM | POA: Diagnosis not present

## 2021-02-19 DIAGNOSIS — R930 Abnormal findings on diagnostic imaging of skull and head, not elsewhere classified: Secondary | ICD-10-CM | POA: Diagnosis not present

## 2021-02-19 DIAGNOSIS — C3411 Malignant neoplasm of upper lobe, right bronchus or lung: Secondary | ICD-10-CM | POA: Diagnosis not present

## 2021-02-19 DIAGNOSIS — Z510 Encounter for antineoplastic radiation therapy: Secondary | ICD-10-CM | POA: Diagnosis not present

## 2021-02-25 ENCOUNTER — Ambulatory Visit: Payer: Medicare Other

## 2021-03-02 DIAGNOSIS — J449 Chronic obstructive pulmonary disease, unspecified: Secondary | ICD-10-CM | POA: Diagnosis not present

## 2021-03-04 ENCOUNTER — Other Ambulatory Visit: Payer: Self-pay

## 2021-03-04 ENCOUNTER — Ambulatory Visit (INDEPENDENT_AMBULATORY_CARE_PROVIDER_SITE_OTHER): Payer: Medicare Other

## 2021-03-04 VITALS — Ht 68.0 in | Wt 239.0 lb

## 2021-03-04 DIAGNOSIS — Z0000 Encounter for general adult medical examination without abnormal findings: Secondary | ICD-10-CM

## 2021-03-04 NOTE — Progress Notes (Signed)
Subjective:   Randall Sawyer is a 66 y.o. male who presents for an Initial Medicare Annual Wellness Visit. Virtual Visit via Telephone Note  I connected with  Chyrl Civatte on 03/04/21 at 12:00 PM EST by telephone and verified that I am speaking with the correct person using two identifiers.  Location: Patient: HOME Provider: [facility] Persons participating in the virtual visit: patient/Nurse Health Advisor   I discussed the limitations, risks, security and privacy concerns of performing an evaluation and management service by telephone and the availability of in person appointments. The patient expressed understanding and agreed to proceed.  Interactive audio and video telecommunications were attempted between this nurse and patient, however failed, due to patient having technical difficulties OR patient did not have access to video capability.  We continued and completed visit with audio only.  Some vital signs may be absent or patient reported.   Chriss Driver, LPN Review of Systems     Cardiac Risk Factors include: advanced age (>46men, >10 women);male gender;sedentary lifestyle;obesity (BMI >30kg/m2);Other (see comment), Risk factor comments: Lung Cancer, O2 use, COPD     Objective:    Today's Vitals   03/04/21 1152  Weight: 239 lb (108.4 kg)  Height: 5\' 8"  (1.727 m)   Body mass index is 36.34 kg/m.  Advanced Directives 03/04/2021 12/29/2019 12/29/2019 04/11/2017 04/03/2017 02/26/2015 11/19/2014  Does Patient Have a Medical Advance Directive? Yes No No No No No No  Type of Paramedic of [place];Living will - - - - - -  Copy of [place] in Chart? Yes - validated most recent copy scanned in chart (See row information) - - - - - -  Would patient like information on creating a medical advance directive? No - Patient declined No - Patient declined No - Patient declined No - Patient declined No - Patient declined No - patient  declined information No - patient declined information    Current Medications (verified) Outpatient Encounter Medications as of 03/04/2021  Medication Sig   acetaminophen (TYLENOL) 325 MG tablet Take 2 tablets (650 mg total) by mouth every 6 (six) hours as needed for mild pain (or Fever >/= 101).   Budeson-Glycopyrrol-Formoterol (BREZTRI AEROSPHERE) 160-9-4.8 MCG/ACT AERO Inhale 2 puffs into the lungs in the morning and at bedtime.   furosemide (LASIX) 40 MG tablet TAKE 1 TABLET(40 MG) BY MOUTH DAILY   Glycopyrrolate-Formoterol (BEVESPI AEROSPHERE) 9-4.8 MCG/ACT AERO Inhale 2 puffs into the lungs 2 (two) times daily.   ipratropium-albuterol (DUONEB) 0.5-2.5 (3) MG/3ML SOLN Take 3 mLs by nebulization every 4 (four) hours as needed.   OXYGEN Inhale 3 L into the lungs daily. And 3 lpm with exertion   pantoprazole (PROTONIX) 40 MG tablet TAKE 1 TABLET(40 MG) BY MOUTH IN THE MORNING AND 30 MINUTES BEFORE BREAKFAST   PROVENTIL HFA 108 (90 Base) MCG/ACT inhaler Inhale 2 puffs into the lungs every 4 (four) hours as needed for shortness of breath.   spironolactone (ALDACTONE) 25 MG tablet Take 1 tablet (25 mg total) by mouth daily.   triamcinolone cream (KENALOG) 0.1 %    [DISCONTINUED] acetaminophen (TYLENOL) 500 MG tablet Take by mouth.   [DISCONTINUED] OXYGEN Inhale 3 L into the lungs.   No facility-administered encounter medications on file as of 03/04/2021.    Allergies (verified) Patient has no known allergies.   History: Past Medical History:  Diagnosis Date   Asthma    Chondrosarcoma of ribs ([place])    right 6th  rib, 4.5 cm, grade 2   COPD (chronic obstructive pulmonary disease) (HCC)    Hypertension    Nephrolithiasis    Tobacco abuse    Past Surgical History:  Procedure Laterality Date   BIOPSY N/A 02/26/2015   Procedure: BIOPSY;  Surgeon: Rogene Houston, MD;  [facility];  Service: Endoscopy;  Laterality: N/A;  Gastric biopsies   ESOPHAGOGASTRODUODENOSCOPY N/A  11/19/2014   Procedure: ESOPHAGOGASTRODUODENOSCOPY (EGD);  Surgeon: Rogene Houston, MD;  [facility];  Service: Endoscopy;  Laterality: N/A;  2:15-rescheduled to 11/9 @1 :43 Ann notified pt   ESOPHAGOGASTRODUODENOSCOPY N/A 02/26/2015   Procedure: ESOPHAGOGASTRODUODENOSCOPY (EGD);  Surgeon: Rogene Houston, MD;  [facility];  Service: Endoscopy;  Laterality: N/A;  22   Family History  Problem Relation Age of Onset   COPD Father    Heart disease Father    Asthma Father    Diabetes type II Mother    Diabetes Sister    Social History   Socioeconomic History   Marital status: Divorced    Spouse name: Not on file   Number of children: Not on file   Years of education: Not on file   Highest education level: Not on file  Occupational History   Occupation: Clinical biochemist   Tobacco Use   Smoking status: Former    Packs/day: 0.25    Years: 51.00    Pack years: 12.75    Types: Cigarettes    Start date: 12/30/1964    Quit date: 05/21/2017    Years since quitting: 3.7   Smokeless tobacco: Never  Vaping Use   Vaping Use: Never used  Substance and Sexual Activity   Alcohol use: No    Alcohol/week: 0.0 standard drinks   Drug use: No   Sexual activity: Yes    Partners: Female  Other Topics Concern   Not on file  Social History Narrative   Not on file   Social Determinants of Health   Financial Resource Strain: Low Risk    Difficulty of Paying Living Expenses: Not hard at all  Food Insecurity: No Food Insecurity   Worried About Charity fundraiser in the Last Year: Never true   [place] in the Last Year: Never true  Transportation Needs: Unknown   Lack of Transportation (Medical): No   Lack of Transportation (Non-Medical): Not on file  Physical Activity: Insufficiently Active   Days of Exercise per Week: 5 days   Minutes of Exercise per Session: 20 min  Stress: No Stress Concern Present   Feeling of Stress : Not at all  Social Connections:  Moderately Integrated   Frequency of Communication with Friends and Family: More than three times a week   Frequency of Social Gatherings with Friends and Family: More than three times a week   Attends Religious Services: 1 to 4 times per year   Active Member of [employer] or Organizations: No   Attends Music therapist: 1 to 4 times per year   Marital Status: Divorced    Tobacco Counseling Counseling given: Not Answered   Clinical Intake:  Pre-visit preparation completed: Yes  Pain : No/denies pain     BMI - recorded: 36.34 Nutritional Status: BMI > 30  Obese Nutritional Risks: None Diabetes: No  How often do you need to have someone help you when you read instructions, pamphlets, or other written materials from your doctor or pharmacy?: 1 - Never  Diabetic?No  Interpreter Needed?: No  Information entered by :: mj Layal Javid, lpn   Activities of Daily Living In your present state of health, do you have any difficulty performing the following activities: 03/04/2021  Hearing? N  Vision? N  Difficulty concentrating or making decisions? N  Walking or climbing stairs? N  Dressing or bathing? N  Doing errands, shopping? N  Preparing Food and eating ? N  Using the Toilet? N  In the past six months, have you accidently leaked urine? N  Do you have problems with loss of bowel control? N  Managing your Medications? N  Managing your Finances? N  Housekeeping or managing your Housekeeping? N  Some recent data might be hidden    Patient Care Team: Susy Frizzle, MD as PCP - General (Family Medicine) Satira Sark, MD as Consulting Physician (Cardiology) Edythe Clarity, [facility] as Pharmacist (Pharmacist)  Indicate any recent Medical Services you may have received from other than [facility] providers in the past year (date may be approximate).     Assessment:   This is a routine wellness examination for [place].  Hearing/Vision screen Hearing Screening -  Comments:: No hearing issues.  Vision Screening - Comments:: Glasses. [facility] [place]. 2019  Dietary issues and exercise activities discussed: Current Exercise Habits: Home exercise routine, Type of exercise: walking, Time (Minutes): 20, Frequency (Times/Week): 5, Weekly Exercise (Minutes/Week): 100, Intensity: Mild, Exercise limited by: respiratory conditions(s)   Goals Addressed             This Visit's Progress    Exercise 3x per week (30 min per time)       Exercise as tolerated.       Depression Screen PHQ 2/9 Scores 03/04/2021 07/31/2020 11/22/2016  PHQ - 2 Score 0 0 0    Fall Risk Fall Risk  03/04/2021 07/31/2020  Falls in the past year? 0 0  Number falls in past yr: 0 0  Injury with Fall? 0 0  Risk for fall due to : No Fall Risks;Other (Comment) No Fall Risks  Follow up Falls prevention discussed Falls evaluation completed    FALL RISK PREVENTION PERTAINING TO THE HOME:  Any stairs in or around the home? Yes  If so, are there any without handrails? No  Home free of loose throw rugs in walkways, pet beds, electrical cords, etc? Yes  Adequate lighting in your home to reduce risk of falls? Yes   ASSISTIVE DEVICES UTILIZED TO PREVENT FALLS:  Life alert? No  Use of a cane, walker or w/c? No  Grab bars in the bathroom? No  Shower chair or bench in shower? Yes  Elevated toilet seat or a handicapped toilet? No   TIMED UP AND GO:  Was the test performed? No .  Phone visit.   Cognitive Function:     6CIT Screen 03/04/2021  What Year? 0 points  What month? 0 points  What time? 0 points  Count back from 20 0 points  Months in reverse 0 points  Repeat phrase 0 points  Total Score 0    Immunizations Immunization History  Administered Date(s) Administered   Influenza Split 11/11/2014, 10/11/2015   Moderna Sars-Covid-2 Vaccination 09/06/2019, 10/04/2019   Pneumococcal Polysaccharide-23 11/03/2014    TDAP status: Due, Education has been provided regarding  the importance of this vaccine. Advised may receive this vaccine at local pharmacy or Health Dept. Aware to provide a copy of the vaccination record if obtained from local pharmacy or Health Dept. Verbalized acceptance and understanding.  Flu Vaccine  status: Declined, Education has been provided regarding the importance of this vaccine but patient still declined. Advised may receive this vaccine at local pharmacy or Health Dept. Aware to provide a copy of the vaccination record if obtained from local pharmacy or Health Dept. Verbalized acceptance and understanding.  Pneumococcal vaccine status: Due, Education has been provided regarding the importance of this vaccine. Advised may receive this vaccine at local pharmacy or Health Dept. Aware to provide a copy of the vaccination record if obtained from local pharmacy or Health Dept. Verbalized acceptance and understanding.  Covid-19 vaccine status: Completed vaccines  Qualifies for Shingles Vaccine? Yes   Zostavax completed No   Shingrix Completed?: No.    Education has been provided regarding the importance of this vaccine. Patient has been advised to call insurance company to determine out of pocket expense if they have not yet received this vaccine. Advised may also receive vaccine at local pharmacy or Health Dept. Verbalized acceptance and understanding.  Screening Tests Health Maintenance  Topic Date Due   TETANUS/TDAP  Never done   Zoster Vaccines- Shingrix (1 of 2) Never done   Pneumonia Vaccine 72+ Years old (2 - PCV) 11/03/2015   COVID-19 Vaccine (3 - Moderna risk series) 11/01/2019   Hepatitis C Screening  03/10/2021 (Originally 12/22/1973)   INFLUENZA VACCINE  04/09/2021 (Originally 08/10/2020)   COLONOSCOPY (Pts 45-76yrs Insurance coverage will need to be confirmed)  2022-04-02 (Originally 12/22/1998)   HPV VACCINES  Aged Out    Health Maintenance  Health Maintenance Due  Topic Date Due   TETANUS/TDAP  Never done   Zoster  Vaccines- Shingrix (1 of 2) Never done   Pneumonia Vaccine 50+ Years old (2 - PCV) 11/03/2015   COVID-19 Vaccine (3 - Moderna risk series) 11/01/2019    Colorectal cancer screening: Referral to GI placed PT DECLINED. Pt aware the office will call re: appt.  Lung Cancer Screening: (Low Dose CT Chest recommended if Age 80-80 years, 30 pack-year currently smoking OR have quit w/in 15years.) does qualify.   Lung Cancer Screening Referral: Currently under care for Lung Cancer.   Additional Screening:  Hepatitis C Screening: does qualify; Completed DUE  Vision Screening: Recommended annual ophthalmology exams for early detection of glaucoma and other disorders of the eye. Is the patient up to date with their annual eye exam?  No  Who is the provider or what is the name of the office in which the patient attends annual eye exams? [facility] If pt is not established with a provider, would they like to be referred to a provider to establish care? No .   Dental Screening: Recommended annual dental exams for proper oral hygiene  Community Resource Referral / Chronic Care Management: CRR required this visit?  No   CCM required this visit?  No      Plan:     I have personally reviewed and noted the following in the patients chart:   Medical and social history Use of alcohol, tobacco or illicit drugs  Current medications and supplements including opioid prescriptions. Patient is not currently taking opioid prescriptions. Functional ability and status Nutritional status Physical activity Advanced directives List of other physicians Hospitalizations, surgeries, and ER visits in previous 12 months Vitals Screenings to include cognitive, depression, and falls Referrals and appointments  In addition, I have reviewed and discussed with patient certain preventive protocols, quality metrics, and best practice recommendations. A written personalized care plan for preventive services  as well as general preventive health  recommendations were provided to patient.     Chriss Driver, LPN   09/07/8335   Nurse Notes: Pt currently under treatment with Oncology for Lung Ca dx as of 02/05/2021. Discussed colonoscopy and patient declined at this time. Discussed Shingrix and to check with Oncology before receiving.

## 2021-03-04 NOTE — Patient Instructions (Signed)
Randall Sawyer , Thank you for taking time to come for your Medicare Wellness Visit. I appreciate your ongoing commitment to your health goals. Please review the following plan we discussed and let me know if I can assist you in the future.   Screening recommendations/referrals: Colonoscopy: Declined at this time.  Recommended yearly ophthalmology/optometry visit for glaucoma screening and checkup Recommended yearly dental visit for hygiene and checkup  Vaccinations: Influenza vaccine: Declined Pneumococcal vaccine: Done 11/03/2014 Tdap vaccine: Due Repeat in 10 years  Shingles vaccine: Discussed. Check with Dr. Dennard Schaumann, Dr. [name] or Oncologist before getting.   Covid-19: Done 09/06/19 and 10/04/19.  Advanced directives: Please bring a copy of your health care power of attorney and living will to the office to be added to your chart at your convenience.   Conditions/risks identified: Aim for 30 minutes of exercise or brisk walking each day, drink 6-8 glasses of water and eat lots of fruits and vegetables. KEEP UP THE GOOD WORK!  Next appointment: Follow up in one year for your annual wellness visit. 2024  Preventive Care 65 Years and Older, Male  Preventive care refers to lifestyle choices and visits with your health care provider that can promote health and wellness. What does preventive care include? A yearly physical exam. This is also called an annual well check. Dental exams once or twice a year. Routine eye exams. Ask your health care provider how often you should have your eyes checked. Personal lifestyle choices, including: Daily care of your teeth and gums. Regular physical activity. Eating a healthy diet. Avoiding tobacco and drug use. Limiting alcohol use. Practicing safe sex. Taking low doses of aspirin every day. Taking vitamin and mineral supplements as recommended by your health care provider. What happens during an annual well check? The services and screenings  done by your health care provider during your annual well check will depend on your age, overall health, lifestyle risk factors, and family history of disease. Counseling  Your health care provider may ask you questions about your: Alcohol use. Tobacco use. Drug use. Emotional well-being. Home and relationship well-being. Sexual activity. Eating habits. History of falls. Memory and ability to understand (cognition). Work and work Statistician. Screening  You may have the following tests or measurements: Height, weight, and BMI. Blood pressure. Lipid and cholesterol levels. These may be checked every 5 years, or more frequently if you are over 45 years old. Skin check. Lung cancer screening. You may have this screening every year starting at age 2 if you have a 30-pack-year history of smoking and currently smoke or have quit within the past 15 years. Fecal occult blood test (FOBT) of the stool. You may have this test every year starting at age 68. Flexible sigmoidoscopy or colonoscopy. You may have a sigmoidoscopy every 5 years or a colonoscopy every 10 years starting at age 34. Prostate cancer screening. Recommendations will vary depending on your family history and other risks. Hepatitis C blood test. Hepatitis B blood test. Sexually transmitted disease (STD) testing. Diabetes screening. This is done by checking your blood sugar (glucose) after you have not eaten for a while (fasting). You may have this done every 1-3 years. Abdominal aortic aneurysm (AAA) screening. You may need this if you are a current or former smoker. Osteoporosis. You may be screened starting at age 33 if you are at high risk. Talk with your health care provider about your test results, treatment options, and if necessary, the need for more tests. Vaccines  Your  health care provider may recommend certain vaccines, such as: Influenza vaccine. This is recommended every year. Tetanus, diphtheria, and acellular  pertussis (Tdap, Td) vaccine. You may need a Td booster every 10 years. Zoster vaccine. You may need this after age 25. Pneumococcal 13-valent conjugate (PCV13) vaccine. One dose is recommended after age 80. Pneumococcal polysaccharide (PPSV23) vaccine. One dose is recommended after age 43. Talk to your health care provider about which screenings and vaccines you need and how often you need them. This information is not intended to replace advice given to you by your health care provider. Make sure you discuss any questions you have with your health care provider. Document Released: 01/23/2015 Document Revised: 09/16/2015 Document Reviewed: 10/28/2014 Elsevier Interactive Patient Education  2017 [employer] Prevention in the Home Falls can cause injuries. They can happen to people of all ages. There are many things you can do to make your home safe and to help prevent falls. What can I do on the outside of my home? Regularly fix the edges of walkways and driveways and fix any cracks. Remove anything that might make you trip as you walk through a door, such as a raised step or threshold. Trim any bushes or trees on the path to your home. Use bright outdoor lighting. Clear any walking paths of anything that might make someone trip, such as rocks or tools. Regularly check to see if handrails are loose or broken. Make sure that both sides of any steps have handrails. Any raised decks and porches should have guardrails on the edges. Have any leaves, snow, or ice cleared regularly. Use sand or salt on walking paths during winter. Clean up any spills in your garage right away. This includes oil or grease spills. What can I do in the bathroom? Use night lights. Install grab bars by the toilet and in the tub and shower. Do not use towel bars as grab bars. Use non-skid mats or decals in the tub or shower. If you need to sit down in the shower, use a plastic, non-slip stool. Keep the floor  dry. Clean up any water that spills on the floor as soon as it happens. Remove soap buildup in the tub or shower regularly. Attach bath mats securely with double-sided non-slip rug tape. Do not have throw rugs and other things on the floor that can make you trip. What can I do in the bedroom? Use night lights. Make sure that you have a light by your bed that is easy to reach. Do not use any sheets or blankets that are too big for your bed. They should not hang down onto the floor. Have a firm chair that has side arms. You can use this for support while you get dressed. Do not have throw rugs and other things on the floor that can make you trip. What can I do in the kitchen? Clean up any spills right away. Avoid walking on wet floors. Keep items that you use a lot in easy-to-reach places. If you need to reach something above you, use a strong step stool that has a grab bar. Keep electrical cords out of the way. Do not use floor polish or wax that makes floors slippery. If you must use wax, use non-skid floor wax. Do not have throw rugs and other things on the floor that can make you trip. What can I do with my stairs? Do not leave any items on the stairs. Make sure that there are handrails  on both sides of the stairs and use them. Fix handrails that are broken or loose. Make sure that handrails are as long as the stairways. Check any carpeting to make sure that it is firmly attached to the stairs. Fix any carpet that is loose or worn. Avoid having throw rugs at the top or bottom of the stairs. If you do have throw rugs, attach them to the floor with carpet tape. Make sure that you have a light switch at the top of the stairs and the bottom of the stairs. If you do not have them, ask someone to add them for you. What else can I do to help prevent falls? Wear shoes that: Do not have high heels. Have rubber bottoms. Are comfortable and fit you well. Are closed at the toe. Do not wear  sandals. If you use a stepladder: Make sure that it is fully opened. Do not climb a closed stepladder. Make sure that both sides of the stepladder are locked into place. Ask someone to hold it for you, if possible. Clearly mark and make sure that you can see: Any grab bars or handrails. First and last steps. Where the edge of each step is. Use tools that help you move around (mobility aids) if they are needed. These include: Canes. Walkers. Scooters. Crutches. Turn on the lights when you go into a dark area. Replace any light bulbs as soon as they burn out. Set up your furniture so you have a clear path. Avoid moving your furniture around. If any of your floors are uneven, fix them. If there are any pets around you, be aware of where they are. Review your medicines with your doctor. Some medicines can make you feel dizzy. This can increase your chance of falling. Ask your doctor what other things that you can do to help prevent falls. This information is not intended to replace advice given to you by your health care provider. Make sure you discuss any questions you have with your health care provider. Document Released: 10/23/2008 Document Revised: 06/04/2015 Document Reviewed: 01/31/2014 Elsevier Interactive Patient Education  2017 [employer].

## 2021-03-08 DIAGNOSIS — R590 Localized enlarged lymph nodes: Secondary | ICD-10-CM | POA: Diagnosis not present

## 2021-03-08 DIAGNOSIS — R918 Other nonspecific abnormal finding of lung field: Secondary | ICD-10-CM | POA: Diagnosis not present

## 2021-03-08 DIAGNOSIS — C3411 Malignant neoplasm of upper lobe, right bronchus or lung: Secondary | ICD-10-CM | POA: Diagnosis not present

## 2021-03-09 DIAGNOSIS — C3432 Malignant neoplasm of lower lobe, left bronchus or lung: Secondary | ICD-10-CM | POA: Diagnosis not present

## 2021-03-09 DIAGNOSIS — Z510 Encounter for antineoplastic radiation therapy: Secondary | ICD-10-CM | POA: Diagnosis not present

## 2021-03-09 DIAGNOSIS — C3411 Malignant neoplasm of upper lobe, right bronchus or lung: Secondary | ICD-10-CM | POA: Diagnosis not present

## 2021-03-09 DIAGNOSIS — C3492 Malignant neoplasm of unspecified part of left bronchus or lung: Secondary | ICD-10-CM | POA: Diagnosis not present

## 2021-03-09 DIAGNOSIS — C3491 Malignant neoplasm of unspecified part of right bronchus or lung: Secondary | ICD-10-CM | POA: Diagnosis not present

## 2021-03-10 ENCOUNTER — Telehealth: Payer: Self-pay | Admitting: Pharmacist

## 2021-03-10 NOTE — Progress Notes (Signed)
? ? ?Chronic Care Management ?Pharmacy Assistant  ? ?Name: Randall Sawyer  MRN: [id number] DOB: Apr 06, 1955 ? ?Randall Sawyer is an 66 y.o. year old male who presents for his initial CCM visit with the clinical pharmacist. ? ?Reason for Encounter: Chart Prep for Initial visit with CPP  ?  ? ?Recent office visits:  ?03/04/21 Annual Medicare Wellness Completed ? ?01/07/21 Jenna Luo, MD - Family Medicine - Leg swelling - Labs were ordered. Try Breztri in place of Rentz for better COPD treatment. Follow up as scheduled.  ? ?Recent consult visits:  ?02/05/21 [facility] - Radiation Oncology - No notes available.  ? ?02/05/21 [name] - Hematology/Oncology - [facility] - No notes available.  ? ?02/01/21 [name] - Pulmonology - Follow up as scheduled.  ? ?10/30/20 [name] - Cardiothoracic Surgery - No notes available.  ? ?09/28/20 Christinia Gully, MD - Pulmonology - Labs were ordered. predniSONE (DELTASONE) 10 MG tablet was prescribed. Follow up in 6 months. ? ?Hospital visits:  ?None in previous 6 months ? ?Medications: ?Outpatient Encounter Medications as of 03/10/2021  ?Medication Sig  ? acetaminophen (TYLENOL) 325 MG tablet Take 2 tablets (650 mg total) by mouth every 6 (six) hours as needed for mild pain (or Fever >/= 101).  ? Budeson-Glycopyrrol-Formoterol (BREZTRI AEROSPHERE) 160-9-4.8 MCG/ACT AERO Inhale 2 puffs into the lungs in the morning and at bedtime.  ? furosemide (LASIX) 40 MG tablet TAKE 1 TABLET(40 MG) BY MOUTH DAILY  ? Glycopyrrolate-Formoterol (BEVESPI AEROSPHERE) 9-4.8 MCG/ACT AERO Inhale 2 puffs into the lungs 2 (two) times daily.  ? ipratropium-albuterol (DUONEB) 0.5-2.5 (3) MG/3ML SOLN Take 3 mLs by nebulization every 4 (four) hours as needed.  ? OXYGEN Inhale 3 L into the lungs daily. And 3 lpm with exertion  ? pantoprazole (PROTONIX) 40 MG tablet TAKE 1 TABLET(40 MG) BY MOUTH IN THE MORNING AND 30 MINUTES BEFORE BREAKFAST  ? PROVENTIL HFA 108 (90 Base) MCG/ACT inhaler Inhale 2 puffs  into the lungs every 4 (four) hours as needed for shortness of breath.  ? spironolactone (ALDACTONE) 25 MG tablet Take 1 tablet (25 mg total) by mouth daily.  ? triamcinolone cream (KENALOG) 0.1 %   ? ?No facility-administered encounter medications on file as of 03/10/2021.  ? ? ?Have you seen any other providers since your last visit?  ? ?Any changes in your medications or health?  ? ?Any side effects from any medications?  ? ?Do you have an symptoms or problems not managed by your medications?  ? ?Any concerns about your health right now?  ? ?Has your provider asked that you check blood pressure, blood sugar, or follow special diet at home?  ? ?Do you get any type of exercise on a regular basis?  ? ?Can you think of a goal you would like to reach for your health?  ? ?Do you have any problems getting your medications?  ? ?Is there anything that you would like to discuss during the appointment?  ? ?Please bring medications and supplements to appointment. Patient confirmed appointment date and time.  ? ? ?Care Gaps ? ?AWV: done 03/04/21 ?Colonoscopy: due 03-19-2022 ?DM Eye Exam: N/A ?DM Foot Exam: N/A ?Microalbumin: N/A ?HbgAIC: N/A ?DEXA:  N/A ?Mammogram: N/A ? ?Star Rating Drugs: ?No Star Rating Drugs noted. ? ? ?Future Appointments  ?Date Time Provider [place]  ?03/11/2021 11:00 AM [facility] PHARMACIST [facility] None  ?03-19-22 12:00 PM [facility]-NURSE HEALTH ADVISOR [facility] None  ? ?Multiple attempts were made to contact patient.  Attempts were unsuccessful. / ls,CMA  ? ?Liza Showfety, CCMA ?Clinical Pharmacist Assistant  ?([phone] ? ?

## 2021-03-10 NOTE — Progress Notes (Signed)
Chronic Care Management Pharmacy Note  03/11/2021 Name:  Randall Sawyer MRN:  [id number] DOB:  1955/04/13  Summary: Initial visit with PharmD.  Approved for PAP for Breztri - reports consistent use.  He is about to begin radiation for lung cancer.  Being followed by cards for CHF.  Appears to have some swelling in legs at this time - adherent with Lasix.  Recommendations/Changes made from today's visit: No changes at this time  Plan: FU 6 months   Subjective: Randall Sawyer is an 66 y.o. year old male who is a primary patient of [name], Cammie Mcgee, MD.  The CCM team was consulted for assistance with disease management and care coordination needs.    Engaged with patient face to face for initial visit in response to provider referral for pharmacy case management and/or care coordination services.   Consent to Services:  The patient was given the following information about Chronic Care Management services today, agreed to services, and gave verbal consent: 1. CCM service includes personalized support from designated clinical staff supervised by the primary care provider, including individualized plan of care and coordination with other care providers 2. 24/7 contact phone numbers for assistance for urgent and routine care needs. 3. Service will only be billed when office clinical staff spend 20 minutes or more in a month to coordinate care. 4. Only one practitioner may furnish and bill the service in a calendar month. 5.The patient may stop CCM services at any time (effective at the end of the month) by phone call to the office staff. 6. The patient will be responsible for cost sharing (co-pay) of up to 20% of the service fee (after annual deductible is met). Patient agreed to services and consent obtained.  Patient Care Team: Susy Frizzle, MD as PCP - General (Family Medicine) Satira Sark, MD as Consulting Physician (Cardiology) Edythe Clarity, [facility] as Pharmacist  (Pharmacist)  Recent office visits:  03/04/21 Annual Medicare Wellness Completed   01/07/21 Jenna Luo, MD - Family Medicine - Leg swelling - Labs were ordered. Try Breztri in place of [place] for better COPD treatment. Follow up as scheduled.    Recent consult visits:  02/05/21 [facility] - Radiation Oncology - No notes available.    02/05/21 [name] - Hematology/Oncology - SCC - No notes available.    02/01/21 [name] - Pulmonology - Follow up as scheduled.    10/30/20 [name] - Cardiothoracic Surgery - No notes available.    09/28/20 Christinia Gully, MD - Pulmonology - Labs were ordered. predniSONE (DELTASONE) 10 MG tablet was prescribed. Follow up in 6 months.   Hospital visits:  None in previous 6 months   Objective:  Lab Results  Component Value Date   CREATININE 1.33 01/15/2021   BUN 11 01/15/2021   GFR 59.81 (L) 11/15/2016   EGFR 70 01/07/2021   GFRNONAA >60 01/02/2020   GFRAA >89 11/03/2014   NA 135 01/15/2021   K 4.6 01/15/2021   CALCIUM 8.9 01/15/2021   CO2 38 (H) 01/15/2021   GLUCOSE 100 (H) 01/15/2021    Lab Results  Component Value Date/Time   GFR 59.81 (L) 11/15/2016 10:24 AM   GFR 52.81 (L) 02/09/2016 09:35 AM    Last diabetic Eye exam: No results found for: HMDIABEYEEXA  Last diabetic Foot exam: No results found for: HMDIABFOOTEX   Lab Results  Component Value Date   CHOL 208 (H) 07/28/2020   HDL 44 07/28/2020   LDLCALC 133 (  H) 07/28/2020   TRIG 170 (H) 07/28/2020   CHOLHDL 4.7 07/28/2020    Hepatic Function Latest Ref Rng & Units 01/07/2021 07/28/2020 12/29/2019  Total Protein 6.1 - 8.1 g/dL 7.4 7.3 8.3(H)  Albumin 3.5 - 5.0 g/dL - - 3.5  AST 10 - 35 U/L 11 11 16   ALT 9 - 46 U/L 5(L) 8(L) 17  Alk Phosphatase 38 - 126 U/L - - 125  Total Bilirubin 0.2 - 1.2 mg/dL 0.4 0.5 0.7  Bilirubin, Direct 0.0 - 0.3 mg/dL - - -    Lab Results  Component Value Date/Time   TSH 1.05 08/31/2015 09:35 AM   TSH 1.06 09/03/2014 02:12  PM    CBC Latest Ref Rng & Units 01/07/2021 07/28/2020 01/01/2020  WBC 3.8 - 10.8 Thousand/uL 10.4 8.1 13.9(H)  Hemoglobin 13.2 - 17.1 g/dL 9.9(L) 11.4(L) 10.2(L)  Hematocrit 38.5 - 50.0 % 33.0(L) 35.2(L) 32.7(L)  Platelets 140 - 400 Thousand/uL 336 345 333    No results found for: VD25OH  Clinical ASCVD: Yes  The 10-year ASCVD risk score (Arnett DK, et al., 2019) is: 21.9%   Values used to calculate the score:     Age: 66 years     Sex: Male     Is Non-Hispanic African American: No     Diabetic: No     Tobacco smoker: No     Systolic Blood Pressure: 623 mmHg     Is BP treated: Yes     HDL Cholesterol: 44 mg/dL     Total Cholesterol: 208 mg/dL    Depression screen [facility] 2/9 03/04/2021 07/31/2020 11/22/2016  Decreased Interest 0 0 0  Down, Depressed, Hopeless 0 0 0  PHQ - 2 Score 0 0 0     Social History   Tobacco Use  Smoking Status Former   Packs/day: 0.25   Years: 51.00   Pack years: 12.75   Types: Cigarettes   Start date: 12/30/1964   Quit date: 05/21/2017   Years since quitting: 3.8  Smokeless Tobacco Never   BP Readings from Last 3 Encounters:  01/07/21 (!) 142/68  09/28/20 136/68  07/31/20 (!) 148/78   Pulse Readings from Last 3 Encounters:  01/07/21 94  09/28/20 88  07/31/20 98   Wt Readings from Last 3 Encounters:  03/04/21 239 lb (108.4 kg)  01/07/21 239 lb (108.4 kg)  09/28/20 228 lb (103.4 kg)   BMI Readings from Last 3 Encounters:  03/04/21 36.34 kg/m  01/07/21 36.34 kg/m  09/28/20 34.67 kg/m    Assessment/Interventions: Review of patient past medical history, allergies, medications, health status, including review of consultants reports, laboratory and other test data, was performed as part of comprehensive evaluation and provision of chronic care management services.   SDOH:  (Social Determinants of Health) assessments and interventions performed: Yes  Financial Resource Strain: Low Risk    Difficulty of Paying Living Expenses: Not hard  at all   Food Insecurity: No Food Insecurity   Worried About Charity fundraiser in the Last Year: Never true   Ran Out of Food in the Last Year: Never true    SDOH Screenings   Alcohol Screen: Low Risk    Last Alcohol Screening Score (AUDIT): 0  Depression (PHQ2-9): Low Risk    PHQ-2 Score: 0  Financial Resource Strain: Low Risk    Difficulty of Paying Living Expenses: Not hard at all  Food Insecurity: No Food Insecurity   Worried About Charity fundraiser in the Last Year:  Never true   Ran Out of Food in the Last Year: Never true  Housing: Low Risk    Last Housing Risk Score: 0  Physical Activity: Insufficiently Active   Days of Exercise per Week: 5 days   Minutes of Exercise per Session: 20 min  Social Connections: Moderately Integrated   Frequency of Communication with Friends and Family: More than three times a week   Frequency of Social Gatherings with Friends and Family: More than three times a week   Attends Religious Services: 1 to 4 times per year   Active Member of [employer] or Organizations: No   Attends Music therapist: 1 to 4 times per year   Marital Status: Divorced  Stress: No Stress Concern Present   Feeling of Stress : Not at all  Tobacco Use: Medium Risk   Smoking Tobacco Use: Former   Smokeless Tobacco Use: Never   Passive Exposure: Not on file  Transportation Needs: Unknown   Film/video editor (Medical): No   Lack of Transportation (Non-Medical): Not on file    CCM Care Plan  No Known Allergies  Medications Reviewed Today     Reviewed by Edythe Clarity, [facility] (Pharmacist) on 03/11/21 at 1146  Med List Status: <None>   Medication Order Taking? Sig Documenting Provider Last Dose Status Informant  acetaminophen (TYLENOL) 325 MG tablet [id number] Yes Take 2 tablets (650 mg total) by mouth every 6 (six) hours as needed for mild pain (or Fever >/= 101). Roxan Hockey, MD Taking Active   Budeson-Glycopyrrol-Formoterol (BREZTRI  AEROSPHERE) 160-9-4.8 MCG/ACT Hollie Salk [id number] Yes Inhale 2 puffs into the lungs in the morning and at bedtime. Tanda Rockers, MD Taking Active   furosemide (LASIX) 40 MG tablet [id number] Yes TAKE 1 TABLET(40 MG) BY MOUTH DAILY Susy Frizzle, MD Taking Active   Glycopyrrolate-Formoterol (BEVESPI AEROSPHERE) 9-4.8 MCG/ACT Hollie Salk [id number] Yes Inhale 2 puffs into the lungs 2 (two) times daily. Tanda Rockers, MD Taking Active   ipratropium-albuterol (DUONEB) 0.5-2.5 (3) MG/3ML SOLN [id number] Yes Take 3 mLs by nebulization every 4 (four) hours as needed. Roxan Hockey, MD Taking Active   OXYGEN [id number] Yes Inhale 3 L into the lungs daily. And 3 lpm with exertion [provider] Taking Active Self  pantoprazole (PROTONIX) 40 MG tablet [id number] Yes TAKE 1 TABLET(40 MG) BY MOUTH IN THE MORNING AND 30 MINUTES BEFORE BREAKFAST Susy Frizzle, MD Taking Active   PROVENTIL HFA 108 [phone] Base) MCG/ACT inhaler [id number] Yes Inhale 2 puffs into the lungs every 4 (four) hours as needed for shortness of breath. Tanda Rockers, MD Taking Active   spironolactone (ALDACTONE) 25 MG tablet [id number] Yes Take 1 tablet (25 mg total) by mouth daily. 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[place])    Dependence on nocturnal oxygen therapy 05/16/2017   GERD (gastroesophageal reflux disease) 05/16/2017   Tobacco use 05/16/2017   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  Bone mass 03/28/2017    Pulmonary infiltrates 03/02/2017   Chronic respiratory failure with hypoxia ([place]) 07/27/2014   Bilateral leg edema 02/14/2014   Dyspnea 01/21/2014   Kidney stones    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     Immunization History  Administered Date(s) Administered   Influenza Split 11/11/2014, 10/11/2015   Moderna Sars-Covid-2 Vaccination 09/06/2019, 10/04/2019   Pneumococcal Polysaccharide-23 11/03/2014    Conditions to be addressed/monitored:  COPD, GERD, HLD, Tobacco Use  Care Plan : General Pharmacy (Adult)  Updates made by Edythe Clarity, RPH since 03/11/2021 12:00 AM     Problem: COPD, GERD, HLD, Tobacco Use   Priority: High  Onset Date: 03/11/2021     Long-Range Goal: Patient-Specific Goal   Start Date: 03/11/2021  Expected End Date: 09/11/2021  This Visit's Progress: On track  Priority: High  Note:   Current Barriers:  Lung cancer, COPD  Pharmacist Clinical Goal(s):  Patient will achieve improvement in breathing as evidenced by symptoms through collaboration with PharmD and provider.   Interventions: 1:1 collaboration with Susy Frizzle, MD regarding development and update of comprehensive plan of care as evidenced by provider attestation and co-signature Inter-disciplinary care team collaboration (see longitudinal plan of care) Comprehensive medication review performed; medication list updated in electronic medical record  Hyperlipidemia: (LDL goal < 100) -Uncontrolled -Current treatment: None noted -Medications previously tried: none noted  -Current dietary patterns: limiting salt, does not eat potato chips, daughter is watching what he eats -Current exercise habits: walks dog occasionally -Educated on Cholesterol goals;  Benefits of statin for ASCVD risk reduction; Importance of limiting foods high in cholesterol; -He has previously declined cholesterol medication. Current ASCVD risk 21.9% - high risk.  He would be candidate for high intensity statin based off this.   Continues to decline.  Recommend recheck lipids and continue to recommend treatment as necessary.  Currently going through radiation for lung cancer.  Let patient get through this then readdress.  COPD (Goal: control symptoms and prevent exacerbations) -Controlled -Current treatment  Breztri 160-9-4.8 mcg/aer morning and hs Appropriate, Effective, Safe, Accessible Duoneb 0.5-2.80m/3ml prn Appropriate, Effective, Safe, Accessible Proventil HFA 954m daily Appropriate, Effective, Safe, Accessible -Medications previously tried: BeFinancial controller-Pulmonary function testing: Pulmonary Functions Testing Results:  No results found for: FEV1, FVC, FEV1FVC, TLC, DLCO  -Exacerbations requiring treatment in last 6 months: none -Patient reports consistent use of maintenance inhaler -Frequency of rescue inhaler use: 3-4 times per week during walking/over exertion -Counseled on Proper inhaler technique; Benefits of consistent maintenance inhaler use When to use rescue inhaler Differences between maintenance and rescue inhalers -Recommended to continue current medication Assessed patient finances. Applied and approved for [place] patient assistance for [employer] Patient will receive free inhalers delivered to his house for remainder of year 2023.  Tobacco use (Goal: Smoking cessation) -Controlled -Current treatment  none -Patient has reported that he has not smoked a cigarette since his hospital stay.  Breathing has improved but he does report some weight gain. -Congratulated patient for this success - no other changes at this time continue to check in and encourage.  GERD (Goal: Minimize symptoms) -Controlled -Current treatment  Pantoprazole 4067mwice daily -Medications previously tried: none noted -takes appropriately, no current symptoms  -Recommended to continue current medication  Patient Goals/Self-Care Activities Patient will:  - take medications as prescribed as evidenced by patient report  and record review engage in dietary modifications by limiting salt intake to promote euvolemia  Follow Up Plan: The care  management team will reach out to the patient again over the next 180 days.       Medication Assistance: Application for Breztri  medication assistance program. in process.  Anticipated assistance start date is today.  See plan of care for additional detail.  Compliance/Adherence/Medication fill history: Care Gaps: None  Star-Rating Drugs: N/A  Patient's preferred pharmacy is:  [id number] - [place], [place] - [address] AT [place] [place] [address] Phone: [phone] Fax: [phone]  [place], [place] [place] [address] Phone: [phone] Fax: [phone]  Uses pill box? No - takes out of vials Pt endorses 100% compliance  We discussed: Benefits of medication synchronization, packaging and delivery as well as enhanced pharmacist oversight with [employer]. Patient decided to: Continue current medication management strategy  Care Plan and Follow Up Patient Decision:  Patient agrees to Care Plan and Follow-up.  Plan: The care management team will reach out to the patient again over the next 180 days.  Beverly Milch, PharmD, CPP Clinical Pharmacist Practitioner [place] [phone]

## 2021-03-11 ENCOUNTER — Other Ambulatory Visit: Payer: Self-pay

## 2021-03-11 ENCOUNTER — Telehealth: Payer: Self-pay

## 2021-03-11 ENCOUNTER — Ambulatory Visit (INDEPENDENT_AMBULATORY_CARE_PROVIDER_SITE_OTHER): Payer: Medicare Other | Admitting: Pharmacist

## 2021-03-11 DIAGNOSIS — K219 Gastro-esophageal reflux disease without esophagitis: Secondary | ICD-10-CM

## 2021-03-11 DIAGNOSIS — Z720 Tobacco use: Secondary | ICD-10-CM

## 2021-03-11 DIAGNOSIS — C3411 Malignant neoplasm of upper lobe, right bronchus or lung: Secondary | ICD-10-CM

## 2021-03-11 DIAGNOSIS — J449 Chronic obstructive pulmonary disease, unspecified: Secondary | ICD-10-CM

## 2021-03-11 MED ORDER — BREZTRI AEROSPHERE 160-9-4.8 MCG/ACT IN AERO: 2.0000 | INHALATION_SPRAY | Freq: Two times a day (BID) | RESPIRATORY_TRACT | 3 refills | Status: DC

## 2021-03-11 MED ORDER — BREZTRI AEROSPHERE 160-9-4.8 MCG/ACT IN AERO: 2.0000 | INHALATION_SPRAY | Freq: Two times a day (BID) | RESPIRATORY_TRACT | 11 refills | Status: DC

## 2021-03-11 NOTE — Patient Instructions (Addendum)
Visit Information ? ? Goals Addressed   ? ?  ?  ?  ?  ? This Visit's Progress  ?  Track and Manage My Symptoms-COPD     ?  Timeframe:  Long-Range Goal ?Priority:  High ?Start Date:    03/11/21                         ?Expected End Date:   09/11/21                   ? ?Follow Up Date 06/11/21  ?  ?- develop a rescue plan ?- eliminate symptom triggers at home ?- follow rescue plan if symptoms flare-up ?- keep follow-up appointments  ?  ?Why is this important?   ?Tracking your symptoms and other information about your health helps your doctor plan your care.  ?Write down the symptoms, the time of day, what you were doing and what medicine you are taking.  ?You will soon learn how to manage your symptoms.   ?  ?Notes:  ?  ? ?  ? ?Patient Care Plan: General Pharmacy (Adult)  ?  ? ?Problem Identified: COPD, GERD, HLD, Tobacco Use   ?Priority: High  ?Onset Date: 03/11/2021  ?  ? ?Long-Range Goal: Patient-Specific Goal   ?Start Date: 03/11/2021  ?Expected End Date: 09/11/2021  ?This Visit's Progress: On track  ?Priority: High  ?Note:   ?Current Barriers:  ?Lung cancer, COPD ? ?Pharmacist Clinical Goal(s):  ?Patient will achieve improvement in breathing as evidenced by symptoms through collaboration with PharmD and provider.  ? ?Interventions: ?1:1 collaboration with Randall Frizzle, MD regarding development and update of comprehensive plan of care as evidenced by provider attestation and co-signature ?Inter-disciplinary care team collaboration (see longitudinal plan of care) ?Comprehensive medication review performed; medication list updated in electronic medical record ? ?Hyperlipidemia: (LDL goal < 100) ?-Uncontrolled ?-Current treatment: ?None noted ?-Medications previously tried: none noted  ?-Current dietary patterns: limiting salt, does not eat potato chips, daughter is watching what he eats ?-Current exercise habits: walks dog occasionally ?-Educated on Cholesterol goals;  ?Benefits of statin for ASCVD risk  reduction; ?Importance of limiting foods high in cholesterol; ?-He has previously declined cholesterol medication. ?Current ASCVD risk 21.9% - high risk.  He would be candidate for high intensity statin based off this.  Continues to decline.  Recommend recheck lipids and continue to recommend treatment as necessary.  Currently going through radiation for lung cancer.  Let patient get through this then readdress. ? ?COPD (Goal: control symptoms and prevent exacerbations) ?-Controlled ?-Current treatment  ?Breztri 160-9-4.8 mcg/aer morning and hs Appropriate, Effective, Safe, Accessible ?Duoneb 0.5-2.5mg /5ml prn Appropriate, Effective, Safe, Accessible ?Proventil HFA 28mcg daily Appropriate, Effective, Safe, Accessible ?-Medications previously tried: Bevespi  ?-Pulmonary function testing: Pulmonary Functions Testing Results: ? ?No results found for: FEV1, FVC, FEV1FVC, TLC, DLCO ? ?-Exacerbations requiring treatment in last 6 months: none ?-Patient reports consistent use of maintenance inhaler ?-Frequency of rescue inhaler use: 3-4 times per week during walking/over exertion ?-Counseled on Proper inhaler technique; ?Benefits of consistent maintenance inhaler use ?When to use rescue inhaler ?Differences between maintenance and rescue inhalers ?-Recommended to continue current medication ?Assessed patient finances. Applied and approved for AZ&Me patient assistance for [employer].  Patient will receive free inhalers delivered to his house for remainder of year 2023. ? ?Tobacco use (Goal: Smoking cessation) ?-Controlled ?-Current treatment  ?none ?-Patient has reported that he has not smoked a cigarette since his hospital  stay.  Breathing has improved but he does report some weight gain. ?-Congratulated patient for this success - no other changes at this time continue to check in and encourage. ? ?GERD (Goal: Minimize symptoms) ?-Controlled ?-Current treatment  ?Pantoprazole 40mg  twice daily ?-Medications previously tried:  none noted ?-takes appropriately, no current symptoms  ?-Recommended to continue current medication ? ?Patient Goals/Self-Care Activities ?Patient will:  ?- take medications as prescribed as evidenced by patient report and record review ?engage in dietary modifications by limiting salt intake to promote euvolemia ? ?Follow Up Plan: The care management team will reach out to the patient again over the next 180 days.  ?  ? ? ?Randall Sawyer was given information about Chronic Care Management services today including:  ?CCM service includes personalized support from designated clinical staff supervised by his physician, including individualized plan of care and coordination with other care providers ?24/7 contact phone numbers for assistance for urgent and routine care needs. ?Standard insurance, coinsurance, copays and deductibles apply for chronic care management only during months in which we provide at least 20 minutes of these services. Most insurances cover these services at 100%, however patients may be responsible for any copay, coinsurance and/or deductible if applicable. This service may help you avoid the need for more expensive face-to-face services. ?Only one practitioner may furnish and bill the service in a calendar month. ?The patient may stop CCM services at any time (effective at the end of the month) by phone call to the office staff. ? ?Patient agreed to services and verbal consent obtained.  ? ?The patient verbalized understanding of instructions, educational materials, and care plan provided today and agreed to receive a mailed copy of patient instructions, educational materials, and care plan.  ?Telephone follow up appointment with pharmacy team member scheduled for: 6 months ? ?Randall Sawyer, [facility]  ?Randall Sawyer, PharmD, CPP ?Clinical Pharmacist Practitioner ?[place] ?([phone] ? ?

## 2021-03-11 NOTE — Telephone Encounter (Signed)
Rx faxed to AZ&ME.  ? ?

## 2021-03-11 NOTE — Telephone Encounter (Signed)
-----   Message from Edythe Clarity, [facility] sent at 03/11/2021 11:15 AM EST ----- ?Hello, ? ?I have helped patient get approved through [place] and Me patient assistance program for his Judithann Sauger. ? ?In order to start receiving the medication for free delivered to his house all they need is signed Rx from Dr. [name]. ? ?Would you mind faxing a signed prescription to [phone] and the patient should start getting this for free which will help him be adherent. ? ?Thanks!! ?Beverly Milch, PharmD, CPP ?Clinical Pharmacist Practitioner ?[place] ?([phone] ? ?

## 2021-03-11 NOTE — Telephone Encounter (Signed)
done

## 2021-03-11 NOTE — Telephone Encounter (Signed)
Routing to [facility] triage since Dr. [name] is working there today.  ?

## 2021-03-15 DIAGNOSIS — C3432 Malignant neoplasm of lower lobe, left bronchus or lung: Secondary | ICD-10-CM | POA: Diagnosis not present

## 2021-03-15 DIAGNOSIS — C3492 Malignant neoplasm of unspecified part of left bronchus or lung: Secondary | ICD-10-CM | POA: Diagnosis not present

## 2021-03-15 DIAGNOSIS — Z510 Encounter for antineoplastic radiation therapy: Secondary | ICD-10-CM | POA: Diagnosis not present

## 2021-03-15 DIAGNOSIS — C3411 Malignant neoplasm of upper lobe, right bronchus or lung: Secondary | ICD-10-CM | POA: Diagnosis not present

## 2021-03-16 DIAGNOSIS — Z510 Encounter for antineoplastic radiation therapy: Secondary | ICD-10-CM | POA: Diagnosis not present

## 2021-03-16 DIAGNOSIS — C3432 Malignant neoplasm of lower lobe, left bronchus or lung: Secondary | ICD-10-CM | POA: Diagnosis not present

## 2021-03-16 DIAGNOSIS — C3411 Malignant neoplasm of upper lobe, right bronchus or lung: Secondary | ICD-10-CM | POA: Diagnosis not present

## 2021-03-17 DIAGNOSIS — C3492 Malignant neoplasm of unspecified part of left bronchus or lung: Secondary | ICD-10-CM | POA: Diagnosis not present

## 2021-03-17 DIAGNOSIS — Z510 Encounter for antineoplastic radiation therapy: Secondary | ICD-10-CM | POA: Diagnosis not present

## 2021-03-17 DIAGNOSIS — C3411 Malignant neoplasm of upper lobe, right bronchus or lung: Secondary | ICD-10-CM | POA: Diagnosis not present

## 2021-03-17 DIAGNOSIS — C3432 Malignant neoplasm of lower lobe, left bronchus or lung: Secondary | ICD-10-CM | POA: Diagnosis not present

## 2021-03-18 DIAGNOSIS — J449 Chronic obstructive pulmonary disease, unspecified: Secondary | ICD-10-CM | POA: Diagnosis not present

## 2021-03-18 DIAGNOSIS — Z510 Encounter for antineoplastic radiation therapy: Secondary | ICD-10-CM | POA: Diagnosis not present

## 2021-03-18 DIAGNOSIS — C3411 Malignant neoplasm of upper lobe, right bronchus or lung: Secondary | ICD-10-CM | POA: Diagnosis not present

## 2021-03-18 DIAGNOSIS — C3432 Malignant neoplasm of lower lobe, left bronchus or lung: Secondary | ICD-10-CM | POA: Diagnosis not present

## 2021-03-18 DIAGNOSIS — C3492 Malignant neoplasm of unspecified part of left bronchus or lung: Secondary | ICD-10-CM | POA: Diagnosis not present

## 2021-03-19 DIAGNOSIS — C3411 Malignant neoplasm of upper lobe, right bronchus or lung: Secondary | ICD-10-CM | POA: Diagnosis not present

## 2021-03-19 DIAGNOSIS — Z510 Encounter for antineoplastic radiation therapy: Secondary | ICD-10-CM | POA: Diagnosis not present

## 2021-03-19 DIAGNOSIS — C3432 Malignant neoplasm of lower lobe, left bronchus or lung: Secondary | ICD-10-CM | POA: Diagnosis not present

## 2021-03-19 DIAGNOSIS — C3492 Malignant neoplasm of unspecified part of left bronchus or lung: Secondary | ICD-10-CM | POA: Diagnosis not present

## 2021-03-22 DIAGNOSIS — C3432 Malignant neoplasm of lower lobe, left bronchus or lung: Secondary | ICD-10-CM | POA: Diagnosis not present

## 2021-03-22 DIAGNOSIS — C3411 Malignant neoplasm of upper lobe, right bronchus or lung: Secondary | ICD-10-CM | POA: Diagnosis not present

## 2021-03-22 DIAGNOSIS — Z510 Encounter for antineoplastic radiation therapy: Secondary | ICD-10-CM | POA: Diagnosis not present

## 2021-03-22 DIAGNOSIS — C3492 Malignant neoplasm of unspecified part of left bronchus or lung: Secondary | ICD-10-CM | POA: Diagnosis not present

## 2021-03-23 DIAGNOSIS — C3432 Malignant neoplasm of lower lobe, left bronchus or lung: Secondary | ICD-10-CM | POA: Diagnosis not present

## 2021-03-23 DIAGNOSIS — C3411 Malignant neoplasm of upper lobe, right bronchus or lung: Secondary | ICD-10-CM | POA: Diagnosis not present

## 2021-03-23 DIAGNOSIS — Z510 Encounter for antineoplastic radiation therapy: Secondary | ICD-10-CM | POA: Diagnosis not present

## 2021-03-23 DIAGNOSIS — C3492 Malignant neoplasm of unspecified part of left bronchus or lung: Secondary | ICD-10-CM | POA: Diagnosis not present

## 2021-03-24 DIAGNOSIS — Z510 Encounter for antineoplastic radiation therapy: Secondary | ICD-10-CM | POA: Diagnosis not present

## 2021-03-24 DIAGNOSIS — C3492 Malignant neoplasm of unspecified part of left bronchus or lung: Secondary | ICD-10-CM | POA: Diagnosis not present

## 2021-03-24 DIAGNOSIS — C3432 Malignant neoplasm of lower lobe, left bronchus or lung: Secondary | ICD-10-CM | POA: Diagnosis not present

## 2021-03-24 DIAGNOSIS — C3411 Malignant neoplasm of upper lobe, right bronchus or lung: Secondary | ICD-10-CM | POA: Diagnosis not present

## 2021-03-25 DIAGNOSIS — C3432 Malignant neoplasm of lower lobe, left bronchus or lung: Secondary | ICD-10-CM | POA: Diagnosis not present

## 2021-03-25 DIAGNOSIS — Z510 Encounter for antineoplastic radiation therapy: Secondary | ICD-10-CM | POA: Diagnosis not present

## 2021-03-25 DIAGNOSIS — C3411 Malignant neoplasm of upper lobe, right bronchus or lung: Secondary | ICD-10-CM | POA: Diagnosis not present

## 2021-03-25 DIAGNOSIS — C3492 Malignant neoplasm of unspecified part of left bronchus or lung: Secondary | ICD-10-CM | POA: Diagnosis not present

## 2021-03-26 DIAGNOSIS — C3492 Malignant neoplasm of unspecified part of left bronchus or lung: Secondary | ICD-10-CM | POA: Diagnosis not present

## 2021-03-26 DIAGNOSIS — C3411 Malignant neoplasm of upper lobe, right bronchus or lung: Secondary | ICD-10-CM | POA: Diagnosis not present

## 2021-03-26 DIAGNOSIS — Z510 Encounter for antineoplastic radiation therapy: Secondary | ICD-10-CM | POA: Diagnosis not present

## 2021-03-26 DIAGNOSIS — C3432 Malignant neoplasm of lower lobe, left bronchus or lung: Secondary | ICD-10-CM | POA: Diagnosis not present

## 2021-03-29 DIAGNOSIS — Z510 Encounter for antineoplastic radiation therapy: Secondary | ICD-10-CM | POA: Diagnosis not present

## 2021-03-29 DIAGNOSIS — C3492 Malignant neoplasm of unspecified part of left bronchus or lung: Secondary | ICD-10-CM | POA: Diagnosis not present

## 2021-03-29 DIAGNOSIS — C3432 Malignant neoplasm of lower lobe, left bronchus or lung: Secondary | ICD-10-CM | POA: Diagnosis not present

## 2021-03-29 DIAGNOSIS — C3411 Malignant neoplasm of upper lobe, right bronchus or lung: Secondary | ICD-10-CM | POA: Diagnosis not present

## 2021-03-30 DIAGNOSIS — Z510 Encounter for antineoplastic radiation therapy: Secondary | ICD-10-CM | POA: Diagnosis not present

## 2021-03-30 DIAGNOSIS — J449 Chronic obstructive pulmonary disease, unspecified: Secondary | ICD-10-CM | POA: Diagnosis not present

## 2021-03-30 DIAGNOSIS — C3432 Malignant neoplasm of lower lobe, left bronchus or lung: Secondary | ICD-10-CM | POA: Diagnosis not present

## 2021-03-30 DIAGNOSIS — C3411 Malignant neoplasm of upper lobe, right bronchus or lung: Secondary | ICD-10-CM | POA: Diagnosis not present

## 2021-03-30 DIAGNOSIS — C3492 Malignant neoplasm of unspecified part of left bronchus or lung: Secondary | ICD-10-CM | POA: Diagnosis not present

## 2021-03-31 DIAGNOSIS — C3411 Malignant neoplasm of upper lobe, right bronchus or lung: Secondary | ICD-10-CM | POA: Diagnosis not present

## 2021-03-31 DIAGNOSIS — C3492 Malignant neoplasm of unspecified part of left bronchus or lung: Secondary | ICD-10-CM | POA: Diagnosis not present

## 2021-03-31 DIAGNOSIS — Z510 Encounter for antineoplastic radiation therapy: Secondary | ICD-10-CM | POA: Diagnosis not present

## 2021-03-31 DIAGNOSIS — C3432 Malignant neoplasm of lower lobe, left bronchus or lung: Secondary | ICD-10-CM | POA: Diagnosis not present

## 2021-04-01 DIAGNOSIS — C3492 Malignant neoplasm of unspecified part of left bronchus or lung: Secondary | ICD-10-CM | POA: Diagnosis not present

## 2021-04-01 DIAGNOSIS — Z510 Encounter for antineoplastic radiation therapy: Secondary | ICD-10-CM | POA: Diagnosis not present

## 2021-04-01 DIAGNOSIS — C3411 Malignant neoplasm of upper lobe, right bronchus or lung: Secondary | ICD-10-CM | POA: Diagnosis not present

## 2021-04-01 DIAGNOSIS — C3432 Malignant neoplasm of lower lobe, left bronchus or lung: Secondary | ICD-10-CM | POA: Diagnosis not present

## 2021-04-02 DIAGNOSIS — C3432 Malignant neoplasm of lower lobe, left bronchus or lung: Secondary | ICD-10-CM | POA: Diagnosis not present

## 2021-04-02 DIAGNOSIS — C3411 Malignant neoplasm of upper lobe, right bronchus or lung: Secondary | ICD-10-CM | POA: Diagnosis not present

## 2021-04-02 DIAGNOSIS — C3492 Malignant neoplasm of unspecified part of left bronchus or lung: Secondary | ICD-10-CM | POA: Diagnosis not present

## 2021-04-02 DIAGNOSIS — Z510 Encounter for antineoplastic radiation therapy: Secondary | ICD-10-CM | POA: Diagnosis not present

## 2021-04-09 DIAGNOSIS — J449 Chronic obstructive pulmonary disease, unspecified: Secondary | ICD-10-CM

## 2021-04-09 DIAGNOSIS — E785 Hyperlipidemia, unspecified: Secondary | ICD-10-CM | POA: Diagnosis not present

## 2021-04-09 DIAGNOSIS — C3411 Malignant neoplasm of upper lobe, right bronchus or lung: Secondary | ICD-10-CM | POA: Diagnosis not present

## 2021-04-12 NOTE — Progress Notes (Signed)
? ?Subjective:  ? ? Patient ID: Randall Sawyer, male    DOB: 07-Nov-1955     MRN: [id number] ? ?  ?Brief patient profile:  ?17  yowm quit smoking 05/2017/ MM   first noticed doe x around 2005 first on just albuterol then added spiriva then symbicort then stiolto which works the best so far and referred to pulmonary clinic 05/27/2014 by Dr Dennard Schaumann for copd eval and proved to have a GOLD IV severity  07/23/14 with hypercarbia. ? ? ?History of Present Illness  ?05/27/2014 1st [facility] office visit/ Annica Marinello   ?Chief Complaint  ?Patient presents with  ? Pulmonary Consult  ?  referred by Dr. Dennard Schaumann SOB x 1 year. C/o of SOB, fluid retention, prod cough with yellow thick mucus.  Wheezing late in the evenings. Left sided abdominal pain.   ?indolent onset progressive x 5 y doe now x [employer] leaning on  cart slower than nl pace = MMRC 2 and can't do even one flight of steps ?Does ok flat at hs but occ gets choked / am congestion all year long/ each am, minimal mucus   ?Does stiolto first thing in am ?Ventolin up to 4 x daily, neb just in evening a couple times a week  ?Lasix 40 mg twice daily= new change one week prior to OV  > legs are much better now ?rec ?Plan A =  Automatic = stop stiolto and start Symbicort 160 Take 2 puffs first thing in am and then another 2 puffs about 12 hours later. ?                                     spiriva 2 puffs each am  ?Plan B = Back up ?- Only use your albuterol as a rescue medication  ?Plan C = crisis ?- Your albuterol nebulizer can be used up to every  4 hours with the goal of not needing at all eventually  ?Prednisone 10 mg take  4 each am x 2 days,   2 each am x 2 days,  1 each am x 2 days and stop  ? ? 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 ?   ? 09/28/2020  f/u ov/[place] office/Bennie Scaff re: GOLD IV / 02 dep maint on 3lpm 24/7 but sometimes low 90s  on bevespi / off aldactone  ?Chief Complaint  ?Patient presents with  ? Follow-up  ?  Increased SOB  consistently. 3L O2 all of the time.   ? Dyspnea:  worse x 6 weeks / still walking at [employer]/ 1 aisle s stopping  ?Cough: none  ?Sleeping: flat bed, 2 pillows  ?SABA use: 1-2 per day ?02: 3lpm doesn't titate  ?Covid status: vax x 2 and probably infected  ?Rec ?Prednisone 10 mg take  4 each am x 2 days,   2 each am x 2 days,  1 each am x 2 days and stop  ?Make sure you check your oxygen saturation  at your highest level of activity   ?  ?Finished RT at [facility] for chondrosarcoma April 02 2021, ST only which improved s rx  ? ?04/13/2021  f/u ov/[place] office/Claudetta Sallie re: GOLD IV / 02 dep  maint on breztri   ?Cc "about the same with the breathing"  ?Dyspnea:  mb s stopping on 3lpm does not check sats  ?Cough: min mucoid  ?Sleeping:  flat bed with 3 pillows = baseline  ?SABA use: tiw  once or twice a month  ?02: 3lpm 24/7    ?Covid status: vax x 2  ?  ? ? ?No obvious day to day or daytime variability or assoc excess/ purulent sputum or mucus plugs or hemoptysis or cp or chest tightness, subjective wheeze or overt sinus or hb symptoms.  ? ?Sleeping  without nocturnal  or early am exacerbation  of respiratory  c/o's or need for noct saba. Also denies any obvious fluctuation of symptoms with weather or environmental changes or other aggravating or alleviating factors except as outlined above  ? ?No unusual exposure hx or h/o childhood pna/ asthma or knowledge of premature birth. ? ?Current Allergies, Complete Past Medical History, Past Surgical History, Family History, and Social History were reviewed in [employer] record. ? ?ROS  The following are not active complaints unless bolded ?Hoarseness, sore throat, dysphagia, dental problems, itching, sneezing,  nasal congestion or discharge of excess mucus or purulent secretions, ear ache,   fever, chills, sweats, unintended wt loss or wt gain, classically pleuritic or exertional cp,  orthopnea pnd or arm/hand swelling  or leg swelling, presyncope,  palpitations, abdominal pain, anorexia, nausea, vomiting, diarrhea  or change in bowel habits or change in bladder habits, change in stools or change in urine, dysuria, hematuria,  rash, arthralgias, visual complaints, headache, numbness, weakness or ataxia or problems with walking or coordination,  change in mood or  memory. ?      ? ?Current Meds  ?Medication Sig  ? acetaminophen (TYLENOL) 325 MG tablet Take 2 tablets (650 mg total) by mouth every 6 (six) hours as needed for mild pain (or Fever >/= 101).  ? albuterol (PROVENTIL) (2.5 MG/3ML) 0.083% nebulizer solution Take 3 mLs (2.5 mg total) by nebulization every 4 (four) hours as needed for wheezing or shortness of breath.  ? Budeson-Glycopyrrol-Formoterol (BREZTRI AEROSPHERE) 160-9-4.8 MCG/ACT AERO Inhale 2 puffs into the lungs in the morning and at bedtime.  ? furosemide (LASIX) 40 MG tablet TAKE 1 TABLET(40 MG) BY MOUTH DAILY  ? OXYGEN Inhale 3 L into the lungs daily. And 3 lpm with exertion  ? pantoprazole (PROTONIX) 40 MG tablet TAKE 1 TABLET(40 MG) BY MOUTH IN THE MORNING AND 30 MINUTES BEFORE BREAKFAST  ? PROVENTIL HFA 108 (90 Base) MCG/ACT inhaler Inhale 2 puffs into the lungs every 4 (four) hours as needed for shortness of breath.  ? spironolactone (ALDACTONE) 25 MG tablet Take 1 tablet (25 mg total) by mouth daily.  ? triamcinolone cream (KENALOG) 0.1 %   ? [DISCONTINUED] ipratropium-albuterol (DUONEB) 0.5-2.5 (3) MG/3ML SOLN Take 3 mLs by nebulization every 4 (four) hours as needed.  ?     ?  ? ?  ? ?  ?    ? ? ? ?  ?Objective:  ? Physical Exam ? ?04/13/2021         244  ?09/28/2020       228  ?12/11/2019       234 ?07/03/2019       227  ?12/03/2018     231  ?06/11/2014         179 >  07/23/2014   184 > 09/03/2014 185 > 12/01/2014  196 > 03/03/2015  190 > 08/31/2015     192 > 09/28/2015  195  > 11/09/2015   186  > 02/09/2016     189> 11/15/2016  198 >  03/02/2017  204 > 07/04/2017  201> 10/04/2017 213  > 05/28/2018   225  ?   ?05/27/14 180 lb (81.647 kg)  ?05/16/14 176  lb (79.833 kg)  ?05/09/14 180 lb (81.647 kg)  ?   ? ?Vital signs reviewed  04/13/2021  - Note at rest 02 sats  95% on 3lpm cont  ? ?General appearance:    obese pleasant wm nad   ? ?HEENT : full dentures/ nl orophx ? ? ?NECK :  without JVD/Nodes/TM/ nl carotid upstrokes bilaterally ? ? ?LUNGS: no acc muscle use,  Mod barrel  contour chest wall with bilateral  Distant bs s audible wheeze and  without cough on insp or exp maneuvers and mod  Hyperresonant  to  percussion bilaterally   ? ? ?CV:  RRR  no s3 or murmur or increase in P2, and 1+ ptting both LE wearing elastic hose ? ?ABD:  soft and nontender with pos mid insp Hoover's  in the supine position. No bruits or organomegaly appreciated, bowel sounds nl ? ?MS:     ext warm without deformities, calf tenderness, cyanosis or clubbing ?No obvious joint restrictions  ? ?SKIN: warm and dry without lesions   ? ?NEURO:  alert, approp, nl sensorium with  no motor or cerebellar deficits apparent.  ?    ?  ?  ?      ? ?  ?  ?

## 2021-04-13 ENCOUNTER — Encounter: Payer: Self-pay | Admitting: Internal Medicine

## 2021-04-13 ENCOUNTER — Ambulatory Visit: Payer: Medicare Other | Admitting: Internal Medicine

## 2021-04-13 DIAGNOSIS — J449 Chronic obstructive pulmonary disease, unspecified: Secondary | ICD-10-CM

## 2021-04-13 DIAGNOSIS — J9611 Chronic respiratory failure with hypoxia: Secondary | ICD-10-CM

## 2021-04-13 MED ORDER — ALBUTEROL SULFATE (2.5 MG/3ML) 0.083% IN NEBU: 2.5000 mg | INHALATION_SOLUTION | RESPIRATORY_TRACT | 12 refills | Status: DC | PRN

## 2021-04-13 NOTE — Assessment & Plan Note (Signed)
Quit smoking 05/2017 /MM ?- HC03 36 05/16/14 ?- 05/27/2014 p extensive coaching HFA effectiveness =    90% > try symbicort/ incruse  ?- 06/11/14 Spriometry FEV1  0.57 (15%) ratio 34 1 h p last saba  ?- PFTs 07/23/2014   FEV1  0.84 (25%) ratio 44 p 16% improvement and dlco 65 p am symbicort  ?-  09/03/2014  Walked RA x 3 laps @ 185 ft each stopped due to  End of study, nl pace, no sob or desat   ?- 03/03/2015   try BEVESPI   ?- d/c cigs 05/21/17 at resection of R rib chondrosarcoma ?- 05/28/2018   continue bevespi 2 bid   ?- 09/28/2020  After extensive coaching inhaler device,  effectiveness =    75% (short Ti)  ? -  09/28/2020  :   alpha one AT phenotype  MM  Level 170  ?-  04/13/2021  After extensive coaching inhaler device,  effectiveness =   80% (still short Ti) ? ? Group D in terms of symptom/risk and laba/lama/ICS  therefore appropriate rx at this point >>>  breztri and approp saba  ? ?  ?

## 2021-04-13 NOTE — Patient Instructions (Signed)
Plan A = Automatic = Always=    Breztri Take 2 puffs first thing in am and then another 2 puffs about 12 hours later.  ?  ? ?Plan B = Backup (to supplement plan A, not to replace it) ?Only use your albuterol inhaler as a rescue medication to be used if you can't catch your breath by resting or doing a relaxed purse lip breathing pattern.  ?- The less you use it, the better it will work when you need it. ?- Ok to use the inhaler up to 2 puffs  every 4 hours if you must but call for appointment if use goes up over your usual need ?- Don't leave home without it !!  (think of it like the spare tire for your car)  ? ?Plan C = Crisis (instead of Plan B but only if Plan B stops working) ?- only use your albuterol nebulizer(not duoneb/ipatropium)  if you first try Plan B and it fails to help > ok to use the nebulizer up to every 4 hours but if start needing it regularly call for immediate appointment ? ? ?Plan D = Doctor ?- call me if B and C not adequate ? ?Plan E = ER ?- go to ER or call 911 if all else fails   ? ?Make sure you check your oxygen saturation  AT  your highest level of activity (not after you stop)   to be sure it stays over 90% and adjust  02 flow upward to maintain this level if needed but remember to turn it back to previous settings when you stop (to conserve your supply).  ? ?Please schedule a follow up visit in 6  months but call sooner if needed  ? ? ?

## 2021-04-13 NOTE — Assessment & Plan Note (Signed)
HC03  36  05/16/14 corresponding to a pc02 above 55  ?- ono RA < 89% x 4 h 11 min > 09/12/2014  rec one liter per min hs and recheck = > done 9/141/6 with < 89% x 49m > no change rx  ?- HC03  11/09/2015  = 39  ?- HCO3 02/09/2016    = 40  ?- HC03  11/15/2016    = 36  ?- 07/04/2017  Walked RA x 3 laps @ 185 ft each stopped due to  desat to 85% then completed 3rd lap nl pace on 3lpm POC pulsed  ?- 12/03/2018   Walked 2lpmx two laps =  approx 518ft @ moderate pace - stopped due to end of study with sats of 91% at the end of the study but sob so rec 3lpm at highest level of activity with goal > 90% and help burn fat to get into neg cal balance  ?-  12/11/2019   Walked RA  approx   200 ft  @ avg pace  stopped due to  Sob/tired with sats still 92% but on RA only 87% at rest vs 90% on 0.5 lpm  ?- 09/28/2020 reports sats at rest low 90s ?   ? ? ?Again advised: ?Make sure you check your oxygen saturation  AT  your highest level of activity (not after you stop)   to be sure it stays over 90% and adjust  02 flow upward to maintain this level if needed but remember to turn it back to previous settings when you stop (to conserve your supply).  ? ? ?    ?  ? ?Each maintenance medication was reviewed in detail including emphasizing most importantly the difference between maintenance and prns and under what circumstances the prns are to be triggered using an action plan format where appropriate. ? ?Total time for H and P, chart review, counseling, reviewing ABC plan for  Hfa/neb and neb  device(s) and generating customized AVS unique to this office visit / same day charting = 20 min  ?     ?

## 2021-04-14 ENCOUNTER — Telehealth: Payer: Self-pay | Admitting: Pharmacist

## 2021-04-14 NOTE — Progress Notes (Signed)
? ? ?  Chronic Care Management ?Pharmacy Assistant  ? ?Name: Randall Sawyer  MRN: [id number] DOB: 1955/05/01 ? ? ?Reason for Encounter: Disease State - General Adherence Call  ?  ? ?Recent office visits:  ?None noted.  ? ?Recent consult visits:  ?04/13/21 Christinia Gully, MD - Pulmonology - COPD -  PROVENTIL HFA 108 [phone] Base) MCG/ACT inhaler prescribed. Follow up as scheduled.  ? ? ?Hospital visits:  ?None in previous 6 months ? ?Medications: ?Outpatient Encounter Medications as of 04/14/2021  ?Medication Sig  ? acetaminophen (TYLENOL) 325 MG tablet Take 2 tablets (650 mg total) by mouth every 6 (six) hours as needed for mild pain (or Fever >/= 101).  ? albuterol (PROVENTIL) (2.5 MG/3ML) 0.083% nebulizer solution Take 3 mLs (2.5 mg total) by nebulization every 4 (four) hours as needed for wheezing or shortness of breath.  ? Budeson-Glycopyrrol-Formoterol (BREZTRI AEROSPHERE) 160-9-4.8 MCG/ACT AERO Inhale 2 puffs into the lungs in the morning and at bedtime.  ? furosemide (LASIX) 40 MG tablet TAKE 1 TABLET(40 MG) BY MOUTH DAILY  ? OXYGEN Inhale 3 L into the lungs daily. And 3 lpm with exertion  ? pantoprazole (PROTONIX) 40 MG tablet TAKE 1 TABLET(40 MG) BY MOUTH IN THE MORNING AND 30 MINUTES BEFORE BREAKFAST  ? PROVENTIL HFA 108 (90 Base) MCG/ACT inhaler Inhale 2 puffs into the lungs every 4 (four) hours as needed for shortness of breath.  ? spironolactone (ALDACTONE) 25 MG tablet Take 1 tablet (25 mg total) by mouth daily.  ? triamcinolone cream (KENALOG) 0.1 %   ? ?No facility-administered encounter medications on file as of 04/14/2021.  ? ? ?Have you had any problems recently with your health? ? ? ?Have you had any problems with your pharmacy? ? ? ?What issues or side effects are you having with your medications? ? ? ?What would you like me to pass along to Leata Mouse, CPP for them to help you with?  ? ? ?What can we do to take care of you better?  ? ? ? ?Care Gaps ?  ?AWV: done 03/04/21 ?Colonoscopy: due March 29, 2022 ?DM Eye  Exam: N/A ?DM Foot Exam: N/A ?Microalbumin: N/A ?HbgAIC: N/A ?DEXA:  N/A ?Mammogram: N/A ? ?  ?Star Rating Drugs: ?No Star Rating Drugs noted. ? ? ?Future Appointments  ?Date Time Provider [place]  ?09/23/2021  3:45 PM [facility]-CCM PHARMACIST [facility]  ?March 29, 2022 12:00 PM [facility]-NURSE HEALTH ADVISOR [facility]  ? ?Multiple attempts were made to contact patient. Attempts were unsuccessful. / ls,CMA  ? ? ?Liza Showfety, CCMA ?Clinical Pharmacist Assistant  ?([phone] ? ?

## 2021-04-18 DIAGNOSIS — J449 Chronic obstructive pulmonary disease, unspecified: Secondary | ICD-10-CM | POA: Diagnosis not present

## 2021-04-30 DIAGNOSIS — J449 Chronic obstructive pulmonary disease, unspecified: Secondary | ICD-10-CM | POA: Diagnosis not present

## 2021-05-06 DIAGNOSIS — C3411 Malignant neoplasm of upper lobe, right bronchus or lung: Secondary | ICD-10-CM | POA: Diagnosis not present

## 2021-05-06 DIAGNOSIS — Z9889 Other specified postprocedural states: Secondary | ICD-10-CM | POA: Diagnosis not present

## 2021-05-06 DIAGNOSIS — C3492 Malignant neoplasm of unspecified part of left bronchus or lung: Secondary | ICD-10-CM | POA: Diagnosis not present

## 2021-05-06 DIAGNOSIS — Z923 Personal history of irradiation: Secondary | ICD-10-CM | POA: Diagnosis not present

## 2021-05-06 DIAGNOSIS — C3432 Malignant neoplasm of lower lobe, left bronchus or lung: Secondary | ICD-10-CM | POA: Diagnosis not present

## 2021-05-14 DIAGNOSIS — Z510 Encounter for antineoplastic radiation therapy: Secondary | ICD-10-CM | POA: Diagnosis not present

## 2021-05-14 DIAGNOSIS — C7951 Secondary malignant neoplasm of bone: Secondary | ICD-10-CM | POA: Diagnosis not present

## 2021-05-14 DIAGNOSIS — R590 Localized enlarged lymph nodes: Secondary | ICD-10-CM | POA: Diagnosis not present

## 2021-05-14 DIAGNOSIS — C3411 Malignant neoplasm of upper lobe, right bronchus or lung: Secondary | ICD-10-CM | POA: Diagnosis not present

## 2021-05-14 DIAGNOSIS — R7309 Other abnormal glucose: Secondary | ICD-10-CM | POA: Diagnosis not present

## 2021-05-14 DIAGNOSIS — C3432 Malignant neoplasm of lower lobe, left bronchus or lung: Secondary | ICD-10-CM | POA: Diagnosis not present

## 2021-05-14 DIAGNOSIS — C3431 Malignant neoplasm of lower lobe, right bronchus or lung: Secondary | ICD-10-CM | POA: Diagnosis not present

## 2021-05-14 DIAGNOSIS — Z87891 Personal history of nicotine dependence: Secondary | ICD-10-CM | POA: Diagnosis not present

## 2021-05-14 DIAGNOSIS — C3492 Malignant neoplasm of unspecified part of left bronchus or lung: Secondary | ICD-10-CM | POA: Diagnosis not present

## 2021-05-17 DIAGNOSIS — C3432 Malignant neoplasm of lower lobe, left bronchus or lung: Secondary | ICD-10-CM | POA: Diagnosis not present

## 2021-05-17 DIAGNOSIS — Z923 Personal history of irradiation: Secondary | ICD-10-CM | POA: Diagnosis not present

## 2021-05-17 DIAGNOSIS — Z9889 Other specified postprocedural states: Secondary | ICD-10-CM | POA: Diagnosis not present

## 2021-05-17 DIAGNOSIS — R911 Solitary pulmonary nodule: Secondary | ICD-10-CM | POA: Diagnosis not present

## 2021-05-17 DIAGNOSIS — C413 Malignant neoplasm of ribs, sternum and clavicle: Secondary | ICD-10-CM | POA: Diagnosis not present

## 2021-05-17 DIAGNOSIS — C3411 Malignant neoplasm of upper lobe, right bronchus or lung: Secondary | ICD-10-CM | POA: Diagnosis not present

## 2021-05-18 DIAGNOSIS — J449 Chronic obstructive pulmonary disease, unspecified: Secondary | ICD-10-CM | POA: Diagnosis not present

## 2021-05-21 DIAGNOSIS — C3492 Malignant neoplasm of unspecified part of left bronchus or lung: Secondary | ICD-10-CM | POA: Diagnosis not present

## 2021-05-21 DIAGNOSIS — C3481 Malignant neoplasm of overlapping sites of right bronchus and lung: Secondary | ICD-10-CM | POA: Diagnosis not present

## 2021-05-21 DIAGNOSIS — C7951 Secondary malignant neoplasm of bone: Secondary | ICD-10-CM | POA: Diagnosis not present

## 2021-05-21 DIAGNOSIS — Z87891 Personal history of nicotine dependence: Secondary | ICD-10-CM | POA: Diagnosis not present

## 2021-05-21 DIAGNOSIS — Z923 Personal history of irradiation: Secondary | ICD-10-CM | POA: Diagnosis not present

## 2021-05-21 DIAGNOSIS — C3432 Malignant neoplasm of lower lobe, left bronchus or lung: Secondary | ICD-10-CM | POA: Diagnosis not present

## 2021-05-30 DIAGNOSIS — J449 Chronic obstructive pulmonary disease, unspecified: Secondary | ICD-10-CM | POA: Diagnosis not present

## 2021-05-31 DIAGNOSIS — R7309 Other abnormal glucose: Secondary | ICD-10-CM | POA: Diagnosis not present

## 2021-05-31 DIAGNOSIS — C3492 Malignant neoplasm of unspecified part of left bronchus or lung: Secondary | ICD-10-CM | POA: Diagnosis not present

## 2021-05-31 DIAGNOSIS — C3431 Malignant neoplasm of lower lobe, right bronchus or lung: Secondary | ICD-10-CM | POA: Diagnosis not present

## 2021-05-31 DIAGNOSIS — C3411 Malignant neoplasm of upper lobe, right bronchus or lung: Secondary | ICD-10-CM | POA: Diagnosis not present

## 2021-05-31 DIAGNOSIS — C7951 Secondary malignant neoplasm of bone: Secondary | ICD-10-CM | POA: Diagnosis not present

## 2021-05-31 DIAGNOSIS — M899 Disorder of bone, unspecified: Secondary | ICD-10-CM | POA: Diagnosis not present

## 2021-05-31 DIAGNOSIS — R590 Localized enlarged lymph nodes: Secondary | ICD-10-CM | POA: Diagnosis not present

## 2021-05-31 DIAGNOSIS — C3432 Malignant neoplasm of lower lobe, left bronchus or lung: Secondary | ICD-10-CM | POA: Diagnosis not present

## 2021-05-31 DIAGNOSIS — Z87891 Personal history of nicotine dependence: Secondary | ICD-10-CM | POA: Diagnosis not present

## 2021-05-31 DIAGNOSIS — C7949 Secondary malignant neoplasm of other parts of nervous system: Secondary | ICD-10-CM | POA: Diagnosis not present

## 2021-06-04 ENCOUNTER — Telehealth: Payer: Self-pay | Admitting: Internal Medicine

## 2021-06-04 DIAGNOSIS — C3492 Malignant neoplasm of unspecified part of left bronchus or lung: Secondary | ICD-10-CM | POA: Diagnosis not present

## 2021-06-04 DIAGNOSIS — C7951 Secondary malignant neoplasm of bone: Secondary | ICD-10-CM | POA: Diagnosis not present

## 2021-06-04 DIAGNOSIS — C3432 Malignant neoplasm of lower lobe, left bronchus or lung: Secondary | ICD-10-CM | POA: Diagnosis not present

## 2021-06-04 DIAGNOSIS — M48061 Spinal stenosis, lumbar region without neurogenic claudication: Secondary | ICD-10-CM | POA: Diagnosis not present

## 2021-06-04 DIAGNOSIS — M5136 Other intervertebral disc degeneration, lumbar region: Secondary | ICD-10-CM | POA: Diagnosis not present

## 2021-06-04 DIAGNOSIS — Z923 Personal history of irradiation: Secondary | ICD-10-CM | POA: Diagnosis not present

## 2021-06-04 DIAGNOSIS — C3411 Malignant neoplasm of upper lobe, right bronchus or lung: Secondary | ICD-10-CM | POA: Diagnosis not present

## 2021-06-04 NOTE — Telephone Encounter (Signed)
No record of phone call in patients chart from [place].  ATC patient to let him know. No answer.

## 2021-06-08 DIAGNOSIS — C7951 Secondary malignant neoplasm of bone: Secondary | ICD-10-CM | POA: Diagnosis not present

## 2021-06-08 DIAGNOSIS — C3411 Malignant neoplasm of upper lobe, right bronchus or lung: Secondary | ICD-10-CM | POA: Diagnosis not present

## 2021-06-08 DIAGNOSIS — R590 Localized enlarged lymph nodes: Secondary | ICD-10-CM | POA: Diagnosis not present

## 2021-06-08 DIAGNOSIS — Z510 Encounter for antineoplastic radiation therapy: Secondary | ICD-10-CM | POA: Diagnosis not present

## 2021-06-08 DIAGNOSIS — C7949 Secondary malignant neoplasm of other parts of nervous system: Secondary | ICD-10-CM | POA: Diagnosis not present

## 2021-06-08 DIAGNOSIS — C3431 Malignant neoplasm of lower lobe, right bronchus or lung: Secondary | ICD-10-CM | POA: Diagnosis not present

## 2021-06-08 DIAGNOSIS — R7309 Other abnormal glucose: Secondary | ICD-10-CM | POA: Diagnosis not present

## 2021-06-08 DIAGNOSIS — C3432 Malignant neoplasm of lower lobe, left bronchus or lung: Secondary | ICD-10-CM | POA: Diagnosis not present

## 2021-06-08 DIAGNOSIS — Z87891 Personal history of nicotine dependence: Secondary | ICD-10-CM | POA: Diagnosis not present

## 2021-06-09 DIAGNOSIS — C3432 Malignant neoplasm of lower lobe, left bronchus or lung: Secondary | ICD-10-CM | POA: Diagnosis not present

## 2021-06-09 DIAGNOSIS — C3431 Malignant neoplasm of lower lobe, right bronchus or lung: Secondary | ICD-10-CM | POA: Diagnosis not present

## 2021-06-09 DIAGNOSIS — R590 Localized enlarged lymph nodes: Secondary | ICD-10-CM | POA: Diagnosis not present

## 2021-06-09 DIAGNOSIS — C3411 Malignant neoplasm of upper lobe, right bronchus or lung: Secondary | ICD-10-CM | POA: Diagnosis not present

## 2021-06-09 DIAGNOSIS — R7309 Other abnormal glucose: Secondary | ICD-10-CM | POA: Diagnosis not present

## 2021-06-09 DIAGNOSIS — Z87891 Personal history of nicotine dependence: Secondary | ICD-10-CM | POA: Diagnosis not present

## 2021-06-09 DIAGNOSIS — C7951 Secondary malignant neoplasm of bone: Secondary | ICD-10-CM | POA: Diagnosis not present

## 2021-06-15 DIAGNOSIS — C413 Malignant neoplasm of ribs, sternum and clavicle: Secondary | ICD-10-CM | POA: Diagnosis not present

## 2021-06-15 DIAGNOSIS — C7949 Secondary malignant neoplasm of other parts of nervous system: Secondary | ICD-10-CM | POA: Diagnosis not present

## 2021-06-15 DIAGNOSIS — Z510 Encounter for antineoplastic radiation therapy: Secondary | ICD-10-CM | POA: Diagnosis not present

## 2021-06-15 DIAGNOSIS — C3411 Malignant neoplasm of upper lobe, right bronchus or lung: Secondary | ICD-10-CM | POA: Diagnosis not present

## 2021-06-15 DIAGNOSIS — C3432 Malignant neoplasm of lower lobe, left bronchus or lung: Secondary | ICD-10-CM | POA: Diagnosis not present

## 2021-06-15 DIAGNOSIS — C7951 Secondary malignant neoplasm of bone: Secondary | ICD-10-CM | POA: Diagnosis not present

## 2021-06-16 DIAGNOSIS — R0989 Other specified symptoms and signs involving the circulatory and respiratory systems: Secondary | ICD-10-CM | POA: Diagnosis not present

## 2021-06-16 DIAGNOSIS — C413 Malignant neoplasm of ribs, sternum and clavicle: Secondary | ICD-10-CM | POA: Diagnosis not present

## 2021-06-16 DIAGNOSIS — C3432 Malignant neoplasm of lower lobe, left bronchus or lung: Secondary | ICD-10-CM | POA: Diagnosis not present

## 2021-06-16 DIAGNOSIS — C3431 Malignant neoplasm of lower lobe, right bronchus or lung: Secondary | ICD-10-CM | POA: Diagnosis not present

## 2021-06-16 DIAGNOSIS — C7949 Secondary malignant neoplasm of other parts of nervous system: Secondary | ICD-10-CM | POA: Diagnosis not present

## 2021-06-16 DIAGNOSIS — Z9089 Acquired absence of other organs: Secondary | ICD-10-CM | POA: Diagnosis not present

## 2021-06-16 DIAGNOSIS — Z510 Encounter for antineoplastic radiation therapy: Secondary | ICD-10-CM | POA: Diagnosis not present

## 2021-06-16 DIAGNOSIS — C3411 Malignant neoplasm of upper lobe, right bronchus or lung: Secondary | ICD-10-CM | POA: Diagnosis not present

## 2021-06-16 DIAGNOSIS — C7951 Secondary malignant neoplasm of bone: Secondary | ICD-10-CM | POA: Diagnosis not present

## 2021-06-17 DIAGNOSIS — Z9089 Acquired absence of other organs: Secondary | ICD-10-CM | POA: Diagnosis not present

## 2021-06-17 DIAGNOSIS — C3432 Malignant neoplasm of lower lobe, left bronchus or lung: Secondary | ICD-10-CM | POA: Diagnosis not present

## 2021-06-17 DIAGNOSIS — C7951 Secondary malignant neoplasm of bone: Secondary | ICD-10-CM | POA: Diagnosis not present

## 2021-06-17 DIAGNOSIS — R0989 Other specified symptoms and signs involving the circulatory and respiratory systems: Secondary | ICD-10-CM | POA: Diagnosis not present

## 2021-06-17 DIAGNOSIS — Z510 Encounter for antineoplastic radiation therapy: Secondary | ICD-10-CM | POA: Diagnosis not present

## 2021-06-17 DIAGNOSIS — C3411 Malignant neoplasm of upper lobe, right bronchus or lung: Secondary | ICD-10-CM | POA: Diagnosis not present

## 2021-06-17 DIAGNOSIS — C413 Malignant neoplasm of ribs, sternum and clavicle: Secondary | ICD-10-CM | POA: Diagnosis not present

## 2021-06-17 DIAGNOSIS — C3431 Malignant neoplasm of lower lobe, right bronchus or lung: Secondary | ICD-10-CM | POA: Diagnosis not present

## 2021-06-18 DIAGNOSIS — Z9089 Acquired absence of other organs: Secondary | ICD-10-CM | POA: Diagnosis not present

## 2021-06-18 DIAGNOSIS — C3432 Malignant neoplasm of lower lobe, left bronchus or lung: Secondary | ICD-10-CM | POA: Diagnosis not present

## 2021-06-18 DIAGNOSIS — C7951 Secondary malignant neoplasm of bone: Secondary | ICD-10-CM | POA: Diagnosis not present

## 2021-06-18 DIAGNOSIS — C413 Malignant neoplasm of ribs, sternum and clavicle: Secondary | ICD-10-CM | POA: Diagnosis not present

## 2021-06-18 DIAGNOSIS — J449 Chronic obstructive pulmonary disease, unspecified: Secondary | ICD-10-CM | POA: Diagnosis not present

## 2021-06-18 DIAGNOSIS — C3411 Malignant neoplasm of upper lobe, right bronchus or lung: Secondary | ICD-10-CM | POA: Diagnosis not present

## 2021-06-18 DIAGNOSIS — R0989 Other specified symptoms and signs involving the circulatory and respiratory systems: Secondary | ICD-10-CM | POA: Diagnosis not present

## 2021-06-18 DIAGNOSIS — C3431 Malignant neoplasm of lower lobe, right bronchus or lung: Secondary | ICD-10-CM | POA: Diagnosis not present

## 2021-06-18 DIAGNOSIS — Z510 Encounter for antineoplastic radiation therapy: Secondary | ICD-10-CM | POA: Diagnosis not present

## 2021-06-21 DIAGNOSIS — Z87891 Personal history of nicotine dependence: Secondary | ICD-10-CM | POA: Diagnosis not present

## 2021-06-21 DIAGNOSIS — C3432 Malignant neoplasm of lower lobe, left bronchus or lung: Secondary | ICD-10-CM | POA: Diagnosis not present

## 2021-06-21 DIAGNOSIS — E8729 Other acidosis: Secondary | ICD-10-CM | POA: Diagnosis not present

## 2021-06-21 DIAGNOSIS — C7800 Secondary malignant neoplasm of unspecified lung: Secondary | ICD-10-CM | POA: Diagnosis not present

## 2021-06-21 DIAGNOSIS — I5031 Acute diastolic (congestive) heart failure: Secondary | ICD-10-CM | POA: Diagnosis not present

## 2021-06-21 DIAGNOSIS — Z923 Personal history of irradiation: Secondary | ICD-10-CM | POA: Diagnosis not present

## 2021-06-21 DIAGNOSIS — J449 Chronic obstructive pulmonary disease, unspecified: Secondary | ICD-10-CM | POA: Diagnosis not present

## 2021-06-21 DIAGNOSIS — C7801 Secondary malignant neoplasm of right lung: Secondary | ICD-10-CM | POA: Diagnosis not present

## 2021-06-21 DIAGNOSIS — I110 Hypertensive heart disease with heart failure: Secondary | ICD-10-CM | POA: Diagnosis not present

## 2021-06-21 DIAGNOSIS — G4733 Obstructive sleep apnea (adult) (pediatric): Secondary | ICD-10-CM | POA: Diagnosis not present

## 2021-06-21 DIAGNOSIS — L409 Psoriasis, unspecified: Secondary | ICD-10-CM | POA: Diagnosis not present

## 2021-06-21 DIAGNOSIS — Z9981 Dependence on supplemental oxygen: Secondary | ICD-10-CM | POA: Diagnosis not present

## 2021-06-21 DIAGNOSIS — I10 Essential (primary) hypertension: Secondary | ICD-10-CM | POA: Diagnosis not present

## 2021-06-21 DIAGNOSIS — C413 Malignant neoplasm of ribs, sternum and clavicle: Secondary | ICD-10-CM | POA: Diagnosis not present

## 2021-06-21 DIAGNOSIS — R0602 Shortness of breath: Secondary | ICD-10-CM | POA: Diagnosis not present

## 2021-06-21 DIAGNOSIS — J9612 Chronic respiratory failure with hypercapnia: Secondary | ICD-10-CM | POA: Diagnosis not present

## 2021-06-21 DIAGNOSIS — C7802 Secondary malignant neoplasm of left lung: Secondary | ICD-10-CM | POA: Diagnosis not present

## 2021-06-21 DIAGNOSIS — R911 Solitary pulmonary nodule: Secondary | ICD-10-CM | POA: Diagnosis not present

## 2021-06-21 DIAGNOSIS — I5033 Acute on chronic diastolic (congestive) heart failure: Secondary | ICD-10-CM | POA: Diagnosis not present

## 2021-06-21 DIAGNOSIS — Z91199 Patient's noncompliance with other medical treatment and regimen due to unspecified reason: Secondary | ICD-10-CM | POA: Diagnosis not present

## 2021-06-21 DIAGNOSIS — J441 Chronic obstructive pulmonary disease with (acute) exacerbation: Secondary | ICD-10-CM | POA: Diagnosis not present

## 2021-06-21 DIAGNOSIS — Z20822 Contact with and (suspected) exposure to covid-19: Secondary | ICD-10-CM | POA: Diagnosis not present

## 2021-06-21 DIAGNOSIS — C7949 Secondary malignant neoplasm of other parts of nervous system: Secondary | ICD-10-CM | POA: Diagnosis not present

## 2021-06-21 DIAGNOSIS — J9621 Acute and chronic respiratory failure with hypoxia: Secondary | ICD-10-CM | POA: Diagnosis not present

## 2021-06-21 DIAGNOSIS — N189 Chronic kidney disease, unspecified: Secondary | ICD-10-CM | POA: Diagnosis not present

## 2021-06-21 DIAGNOSIS — E876 Hypokalemia: Secondary | ICD-10-CM | POA: Diagnosis not present

## 2021-06-21 DIAGNOSIS — R0902 Hypoxemia: Secondary | ICD-10-CM | POA: Diagnosis not present

## 2021-06-21 DIAGNOSIS — J9622 Acute and chronic respiratory failure with hypercapnia: Secondary | ICD-10-CM | POA: Diagnosis not present

## 2021-06-21 DIAGNOSIS — Z510 Encounter for antineoplastic radiation therapy: Secondary | ICD-10-CM | POA: Diagnosis not present

## 2021-06-21 DIAGNOSIS — N179 Acute kidney failure, unspecified: Secondary | ICD-10-CM | POA: Diagnosis not present

## 2021-06-21 DIAGNOSIS — J9611 Chronic respiratory failure with hypoxia: Secondary | ICD-10-CM | POA: Diagnosis not present

## 2021-06-21 DIAGNOSIS — C7951 Secondary malignant neoplasm of bone: Secondary | ICD-10-CM | POA: Diagnosis not present

## 2021-06-21 DIAGNOSIS — R918 Other nonspecific abnormal finding of lung field: Secondary | ICD-10-CM | POA: Diagnosis not present

## 2021-06-21 DIAGNOSIS — C3492 Malignant neoplasm of unspecified part of left bronchus or lung: Secondary | ICD-10-CM | POA: Diagnosis not present

## 2021-06-21 DIAGNOSIS — C3411 Malignant neoplasm of upper lobe, right bronchus or lung: Secondary | ICD-10-CM | POA: Diagnosis not present

## 2021-06-28 DIAGNOSIS — Z510 Encounter for antineoplastic radiation therapy: Secondary | ICD-10-CM | POA: Diagnosis not present

## 2021-06-28 DIAGNOSIS — C3411 Malignant neoplasm of upper lobe, right bronchus or lung: Secondary | ICD-10-CM | POA: Diagnosis not present

## 2021-06-28 DIAGNOSIS — C3432 Malignant neoplasm of lower lobe, left bronchus or lung: Secondary | ICD-10-CM | POA: Diagnosis not present

## 2021-06-28 DIAGNOSIS — C7949 Secondary malignant neoplasm of other parts of nervous system: Secondary | ICD-10-CM | POA: Diagnosis not present

## 2021-07-02 ENCOUNTER — Telehealth: Payer: Self-pay

## 2021-07-02 DIAGNOSIS — C3432 Malignant neoplasm of lower lobe, left bronchus or lung: Secondary | ICD-10-CM | POA: Diagnosis not present

## 2021-07-02 DIAGNOSIS — C3411 Malignant neoplasm of upper lobe, right bronchus or lung: Secondary | ICD-10-CM | POA: Diagnosis not present

## 2021-07-02 DIAGNOSIS — Z510 Encounter for antineoplastic radiation therapy: Secondary | ICD-10-CM | POA: Diagnosis not present

## 2021-07-02 DIAGNOSIS — C413 Malignant neoplasm of ribs, sternum and clavicle: Secondary | ICD-10-CM | POA: Diagnosis not present

## 2021-07-02 DIAGNOSIS — C7951 Secondary malignant neoplasm of bone: Secondary | ICD-10-CM | POA: Diagnosis not present

## 2021-07-02 DIAGNOSIS — R0989 Other specified symptoms and signs involving the circulatory and respiratory systems: Secondary | ICD-10-CM | POA: Diagnosis not present

## 2021-07-02 DIAGNOSIS — J9601 Acute respiratory failure with hypoxia: Secondary | ICD-10-CM | POA: Diagnosis not present

## 2021-07-02 DIAGNOSIS — C3431 Malignant neoplasm of lower lobe, right bronchus or lung: Secondary | ICD-10-CM | POA: Diagnosis not present

## 2021-07-02 DIAGNOSIS — J9612 Chronic respiratory failure with hypercapnia: Secondary | ICD-10-CM | POA: Diagnosis not present

## 2021-07-02 DIAGNOSIS — J441 Chronic obstructive pulmonary disease with (acute) exacerbation: Secondary | ICD-10-CM | POA: Diagnosis not present

## 2021-07-02 DIAGNOSIS — Z9089 Acquired absence of other organs: Secondary | ICD-10-CM | POA: Diagnosis not present

## 2021-07-06 DIAGNOSIS — C3411 Malignant neoplasm of upper lobe, right bronchus or lung: Secondary | ICD-10-CM | POA: Diagnosis not present

## 2021-07-06 DIAGNOSIS — J441 Chronic obstructive pulmonary disease with (acute) exacerbation: Secondary | ICD-10-CM | POA: Diagnosis not present

## 2021-07-06 DIAGNOSIS — C3432 Malignant neoplasm of lower lobe, left bronchus or lung: Secondary | ICD-10-CM | POA: Diagnosis not present

## 2021-07-06 DIAGNOSIS — J9601 Acute respiratory failure with hypoxia: Secondary | ICD-10-CM | POA: Diagnosis not present

## 2021-07-06 DIAGNOSIS — J9612 Chronic respiratory failure with hypercapnia: Secondary | ICD-10-CM | POA: Diagnosis not present

## 2021-07-06 DIAGNOSIS — C7951 Secondary malignant neoplasm of bone: Secondary | ICD-10-CM | POA: Diagnosis not present

## 2021-07-08 DIAGNOSIS — C7951 Secondary malignant neoplasm of bone: Secondary | ICD-10-CM | POA: Diagnosis not present

## 2021-07-08 DIAGNOSIS — C3432 Malignant neoplasm of lower lobe, left bronchus or lung: Secondary | ICD-10-CM | POA: Diagnosis not present

## 2021-07-08 DIAGNOSIS — C3411 Malignant neoplasm of upper lobe, right bronchus or lung: Secondary | ICD-10-CM | POA: Diagnosis not present

## 2021-07-08 DIAGNOSIS — J9612 Chronic respiratory failure with hypercapnia: Secondary | ICD-10-CM | POA: Diagnosis not present

## 2021-07-08 DIAGNOSIS — J441 Chronic obstructive pulmonary disease with (acute) exacerbation: Secondary | ICD-10-CM | POA: Diagnosis not present

## 2021-07-08 DIAGNOSIS — J9601 Acute respiratory failure with hypoxia: Secondary | ICD-10-CM | POA: Diagnosis not present

## 2021-07-15 DIAGNOSIS — J9612 Chronic respiratory failure with hypercapnia: Secondary | ICD-10-CM | POA: Diagnosis not present

## 2021-07-15 DIAGNOSIS — C7951 Secondary malignant neoplasm of bone: Secondary | ICD-10-CM | POA: Diagnosis not present

## 2021-07-15 DIAGNOSIS — C3432 Malignant neoplasm of lower lobe, left bronchus or lung: Secondary | ICD-10-CM | POA: Diagnosis not present

## 2021-07-15 DIAGNOSIS — J9601 Acute respiratory failure with hypoxia: Secondary | ICD-10-CM | POA: Diagnosis not present

## 2021-07-15 DIAGNOSIS — J441 Chronic obstructive pulmonary disease with (acute) exacerbation: Secondary | ICD-10-CM | POA: Diagnosis not present

## 2021-07-15 DIAGNOSIS — C3411 Malignant neoplasm of upper lobe, right bronchus or lung: Secondary | ICD-10-CM | POA: Diagnosis not present

## 2021-07-16 DIAGNOSIS — J9612 Chronic respiratory failure with hypercapnia: Secondary | ICD-10-CM | POA: Diagnosis not present

## 2021-07-16 DIAGNOSIS — C7951 Secondary malignant neoplasm of bone: Secondary | ICD-10-CM | POA: Diagnosis not present

## 2021-07-16 DIAGNOSIS — C3411 Malignant neoplasm of upper lobe, right bronchus or lung: Secondary | ICD-10-CM | POA: Diagnosis not present

## 2021-07-16 DIAGNOSIS — C3432 Malignant neoplasm of lower lobe, left bronchus or lung: Secondary | ICD-10-CM | POA: Diagnosis not present

## 2021-07-16 DIAGNOSIS — J9601 Acute respiratory failure with hypoxia: Secondary | ICD-10-CM | POA: Diagnosis not present

## 2021-07-16 DIAGNOSIS — J441 Chronic obstructive pulmonary disease with (acute) exacerbation: Secondary | ICD-10-CM | POA: Diagnosis not present

## 2021-07-18 DIAGNOSIS — J449 Chronic obstructive pulmonary disease, unspecified: Secondary | ICD-10-CM | POA: Diagnosis not present

## 2021-07-19 DIAGNOSIS — Z923 Personal history of irradiation: Secondary | ICD-10-CM | POA: Diagnosis not present

## 2021-07-19 DIAGNOSIS — R911 Solitary pulmonary nodule: Secondary | ICD-10-CM | POA: Diagnosis not present

## 2021-07-19 DIAGNOSIS — C3432 Malignant neoplasm of lower lobe, left bronchus or lung: Secondary | ICD-10-CM | POA: Diagnosis not present

## 2021-07-19 DIAGNOSIS — Z720 Tobacco use: Secondary | ICD-10-CM | POA: Diagnosis not present

## 2021-07-19 DIAGNOSIS — Z87891 Personal history of nicotine dependence: Secondary | ICD-10-CM | POA: Diagnosis not present

## 2021-07-19 DIAGNOSIS — C3492 Malignant neoplasm of unspecified part of left bronchus or lung: Secondary | ICD-10-CM | POA: Diagnosis not present

## 2021-07-19 DIAGNOSIS — Z85830 Personal history of malignant neoplasm of bone: Secondary | ICD-10-CM | POA: Diagnosis not present

## 2021-07-21 DIAGNOSIS — J9612 Chronic respiratory failure with hypercapnia: Secondary | ICD-10-CM | POA: Diagnosis not present

## 2021-07-21 DIAGNOSIS — C7951 Secondary malignant neoplasm of bone: Secondary | ICD-10-CM | POA: Diagnosis not present

## 2021-07-21 DIAGNOSIS — J441 Chronic obstructive pulmonary disease with (acute) exacerbation: Secondary | ICD-10-CM | POA: Diagnosis not present

## 2021-07-21 DIAGNOSIS — C3432 Malignant neoplasm of lower lobe, left bronchus or lung: Secondary | ICD-10-CM | POA: Diagnosis not present

## 2021-07-21 DIAGNOSIS — C3411 Malignant neoplasm of upper lobe, right bronchus or lung: Secondary | ICD-10-CM | POA: Diagnosis not present

## 2021-07-21 DIAGNOSIS — J9601 Acute respiratory failure with hypoxia: Secondary | ICD-10-CM | POA: Diagnosis not present

## 2021-07-29 ENCOUNTER — Telehealth: Payer: Self-pay | Admitting: Internal Medicine

## 2021-07-29 MED ORDER — PROVENTIL HFA 108 (90 BASE) MCG/ACT IN AERS
2.0000 | INHALATION_SPRAY | RESPIRATORY_TRACT | 11 refills | Status: DC | PRN
Start: 2021-07-29 — End: 2021-07-29

## 2021-07-29 MED ORDER — PROVENTIL HFA 108 (90 BASE) MCG/ACT IN AERS
2.0000 | INHALATION_SPRAY | RESPIRATORY_TRACT | 11 refills | Status: DC | PRN
Start: 2021-07-29 — End: 2021-07-30

## 2021-07-29 NOTE — Telephone Encounter (Signed)
Rx for pt's proventil has been sent to [facility] in [place] for pt. Called and spoke with pt letting him know this had been done and he verbalized understanding. Nothing further needed.

## 2021-07-29 NOTE — Telephone Encounter (Signed)
Rx for pt's proventil inhaler has been sent to preferred pharmacy for pt. Called and spoke with pt letting him know this had been done and he verbalized understanding. Nothing further needed.

## 2021-07-30 ENCOUNTER — Telehealth: Payer: Self-pay | Admitting: Internal Medicine

## 2021-07-30 ENCOUNTER — Other Ambulatory Visit: Payer: Self-pay | Admitting: *Deleted

## 2021-07-30 DIAGNOSIS — R918 Other nonspecific abnormal finding of lung field: Secondary | ICD-10-CM | POA: Diagnosis not present

## 2021-07-30 DIAGNOSIS — J9611 Chronic respiratory failure with hypoxia: Secondary | ICD-10-CM | POA: Diagnosis not present

## 2021-07-30 DIAGNOSIS — R0902 Hypoxemia: Secondary | ICD-10-CM | POA: Diagnosis not present

## 2021-07-30 DIAGNOSIS — J449 Chronic obstructive pulmonary disease, unspecified: Secondary | ICD-10-CM | POA: Diagnosis not present

## 2021-07-30 DIAGNOSIS — R911 Solitary pulmonary nodule: Secondary | ICD-10-CM | POA: Diagnosis not present

## 2021-07-30 MED ORDER — ALBUTEROL SULFATE HFA 108 (90 BASE) MCG/ACT IN AERS: 2.0000 | INHALATION_SPRAY | Freq: Four times a day (QID) | RESPIRATORY_TRACT | 6 refills | Status: DC | PRN

## 2021-07-30 NOTE — Telephone Encounter (Signed)
[place]. Pharmacy tech asks that we send in a new order stating generic is ok per provider. Order sent. Called and notified patient. Nothing further is needed at this time.

## 2021-07-30 NOTE — Telephone Encounter (Signed)
Ok

## 2021-07-30 NOTE — Telephone Encounter (Signed)
No req. Was received by fax.. Dr. [name] are you okay with this patient taking generic form of Proventil inhaler?

## 2021-07-31 DIAGNOSIS — G4733 Obstructive sleep apnea (adult) (pediatric): Secondary | ICD-10-CM | POA: Diagnosis not present

## 2021-08-18 DIAGNOSIS — J449 Chronic obstructive pulmonary disease, unspecified: Secondary | ICD-10-CM | POA: Diagnosis not present

## 2021-08-27 DIAGNOSIS — R0902 Hypoxemia: Secondary | ICD-10-CM | POA: Diagnosis not present

## 2021-08-27 DIAGNOSIS — R911 Solitary pulmonary nodule: Secondary | ICD-10-CM | POA: Diagnosis not present

## 2021-08-27 DIAGNOSIS — J449 Chronic obstructive pulmonary disease, unspecified: Secondary | ICD-10-CM | POA: Diagnosis not present

## 2021-08-27 DIAGNOSIS — J9611 Chronic respiratory failure with hypoxia: Secondary | ICD-10-CM | POA: Diagnosis not present

## 2021-08-27 DIAGNOSIS — R918 Other nonspecific abnormal finding of lung field: Secondary | ICD-10-CM | POA: Diagnosis not present

## 2021-08-30 ENCOUNTER — Other Ambulatory Visit: Payer: Self-pay | Admitting: Family Medicine

## 2021-08-30 DIAGNOSIS — K219 Gastro-esophageal reflux disease without esophagitis: Secondary | ICD-10-CM

## 2021-08-30 DIAGNOSIS — J449 Chronic obstructive pulmonary disease, unspecified: Secondary | ICD-10-CM | POA: Diagnosis not present

## 2021-08-30 DIAGNOSIS — C3492 Malignant neoplasm of unspecified part of left bronchus or lung: Secondary | ICD-10-CM | POA: Diagnosis not present

## 2021-08-30 DIAGNOSIS — R911 Solitary pulmonary nodule: Secondary | ICD-10-CM | POA: Diagnosis not present

## 2021-08-30 DIAGNOSIS — C3402 Malignant neoplasm of left main bronchus: Secondary | ICD-10-CM | POA: Diagnosis not present

## 2021-08-30 DIAGNOSIS — C3491 Malignant neoplasm of unspecified part of right bronchus or lung: Secondary | ICD-10-CM | POA: Diagnosis not present

## 2021-08-30 DIAGNOSIS — G959 Disease of spinal cord, unspecified: Secondary | ICD-10-CM | POA: Diagnosis not present

## 2021-08-30 DIAGNOSIS — C413 Malignant neoplasm of ribs, sternum and clavicle: Secondary | ICD-10-CM | POA: Diagnosis not present

## 2021-08-30 DIAGNOSIS — Z87891 Personal history of nicotine dependence: Secondary | ICD-10-CM | POA: Diagnosis not present

## 2021-08-30 DIAGNOSIS — Z923 Personal history of irradiation: Secondary | ICD-10-CM | POA: Diagnosis not present

## 2021-08-30 DIAGNOSIS — C3411 Malignant neoplasm of upper lobe, right bronchus or lung: Secondary | ICD-10-CM | POA: Diagnosis not present

## 2021-08-31 DIAGNOSIS — G4733 Obstructive sleep apnea (adult) (pediatric): Secondary | ICD-10-CM | POA: Diagnosis not present

## 2021-09-09 DIAGNOSIS — C3411 Malignant neoplasm of upper lobe, right bronchus or lung: Secondary | ICD-10-CM | POA: Diagnosis not present

## 2021-09-09 DIAGNOSIS — M4802 Spinal stenosis, cervical region: Secondary | ICD-10-CM | POA: Diagnosis not present

## 2021-09-09 DIAGNOSIS — C413 Malignant neoplasm of ribs, sternum and clavicle: Secondary | ICD-10-CM | POA: Diagnosis not present

## 2021-09-09 DIAGNOSIS — Z923 Personal history of irradiation: Secondary | ICD-10-CM | POA: Diagnosis not present

## 2021-09-09 DIAGNOSIS — R9089 Other abnormal findings on diagnostic imaging of central nervous system: Secondary | ICD-10-CM | POA: Diagnosis not present

## 2021-09-09 DIAGNOSIS — M47812 Spondylosis without myelopathy or radiculopathy, cervical region: Secondary | ICD-10-CM | POA: Diagnosis not present

## 2021-09-09 DIAGNOSIS — C7951 Secondary malignant neoplasm of bone: Secondary | ICD-10-CM | POA: Diagnosis not present

## 2021-09-09 DIAGNOSIS — C7949 Secondary malignant neoplasm of other parts of nervous system: Secondary | ICD-10-CM | POA: Diagnosis not present

## 2021-09-09 DIAGNOSIS — C3432 Malignant neoplasm of lower lobe, left bronchus or lung: Secondary | ICD-10-CM | POA: Diagnosis not present

## 2021-09-16 ENCOUNTER — Telehealth: Payer: Self-pay

## 2021-09-16 ENCOUNTER — Ambulatory Visit: Payer: Medicare Other | Admitting: Family Medicine

## 2021-09-16 NOTE — Telephone Encounter (Signed)
Pt called requesting oral medication to treat psoriasis, if possible. Pt states he has tried medicated creams but they are not working. Thank you.

## 2021-09-16 NOTE — Progress Notes (Unsigned)
   Acute Office Visit  Subjective:     Patient ID: Randall Sawyer, male    DOB: [birth year], 66 y.o.   MRN: [id number]  No chief complaint on file.   HPI Patient is in today for ***  Well-demarcated erythematous papules/plaques with silvery scale, guttate pink scaly papules.   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, 4.5 cm, grade 2   COPD (chronic obstructive pulmonary disease) (HCC)    Hypertension    Nephrolithiasis    Tobacco abuse    Past Surgical History:  Procedure Laterality Date   BIOPSY N/A 02/26/2015   Procedure: BIOPSY;  Surgeon: Rogene Houston, MD;  [facility];  Service: Endoscopy;  Laterality: N/A;  Gastric biopsies   ESOPHAGOGASTRODUODENOSCOPY N/A 11/19/2014   Procedure: ESOPHAGOGASTRODUODENOSCOPY (EGD);  Surgeon: Rogene Houston, MD;  Location: [facility];  Service: Endoscopy;  Laterality: N/A;  2:15-rescheduled to 11/9 @1 :75 Ann notified pt   ESOPHAGOGASTRODUODENOSCOPY N/A 02/26/2015   Procedure: ESOPHAGOGASTRODUODENOSCOPY (EGD);  Surgeon: Rogene Houston, MD;  [facility]: [facility];  Service: Endoscopy;  Laterality: N/A;  1225   Current Outpatient Medications on File Prior to Visit  Medication Sig Dispense Refill   acetaminophen (TYLENOL) 325 MG tablet Take 2 tablets (650 mg total) by mouth every 6 (six) hours as needed for mild pain (or Fever >/= 101). 12 tablet 0   albuterol (PROVENTIL) (2.5 MG/3ML) 0.083% nebulizer solution Take 3 mLs (2.5 mg total) by nebulization every 4 (four) hours as needed for wheezing or shortness of breath. 75 mL 12   albuterol (VENTOLIN HFA) 108 (90 Base) MCG/ACT inhaler Inhale 2 puffs into the lungs every 6 (six) hours as needed for wheezing or shortness of breath. 18 g 6   Budeson-Glycopyrrol-Formoterol (BREZTRI AEROSPHERE) 160-9-4.8 MCG/ACT AERO Inhale 2 puffs into the lungs in the morning and at bedtime. 10.7 g 11   furosemide (LASIX) 40 MG tablet TAKE 1  TABLET(40 MG) BY MOUTH DAILY 90 tablet 1   OXYGEN Inhale 3 L into the lungs daily. And 3 lpm with exertion     pantoprazole (PROTONIX) 40 MG tablet TAKE 1 TABLET(40 MG) BY MOUTH IN THE MORNING AND 30 MINUTES BEFORE BREAKFAST 90 tablet 3   spironolactone (ALDACTONE) 25 MG tablet Take 1 tablet (25 mg total) by mouth daily. 30 tablet 5   triamcinolone cream (KENALOG) 0.1 %      No current facility-administered medications on file prior to visit.   No Known Allergies   ROS      Objective:    There were no vitals taken for this visit. {Vitals History (Optional):23777}  Physical Exam  No results found for any visits on 09/16/21.      Assessment & Plan:   Start Hydrocortisone cream twice daily for 2-4 weeks (do not use on face, groin, axilla, or breasts) as well as a topical emollient such as CeraVe or Aquaphor.   Rubie Maid, FNP

## 2021-09-16 NOTE — Progress Notes (Signed)
Subjective:    Patient ID: Randall Sawyer, male    DOB: [birth year], 66 y.o.   MRN: [id number]  HPI  Randall Sawyer presents today with complaint that his psoriasis is not improving with current treatment regimen. It is on his back, scalp, legs. Has tried Triamcinolone without relief twice daily. Saw dermatology Dr Nevada Crane in [place] last September or October.   COPD at baseline. Denies chest pain, SOB. Endorses leg swelling most days of the week in the evenings, relieved with elevation. He quit smoking and does not add salt to meals.   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, 4.5 cm, grade 2   COPD (chronic obstructive pulmonary disease) (HCC)    Hypertension    Nephrolithiasis    Tobacco abuse    Past Surgical History:  Procedure Laterality Date   BIOPSY N/A 02/26/2015   Procedure: BIOPSY;  Surgeon: Rogene Houston, MD;  [facility];  Service: Endoscopy;  Laterality: N/A;  Gastric biopsies   ESOPHAGOGASTRODUODENOSCOPY N/A 11/19/2014   Procedure: ESOPHAGOGASTRODUODENOSCOPY (EGD);  Surgeon: Rogene Houston, MD;  [facility];  Service: Endoscopy;  Laterality: N/A;  2:15-rescheduled to 11/9 @1 :82 Ann notified pt   ESOPHAGOGASTRODUODENOSCOPY N/A 02/26/2015   Procedure: ESOPHAGOGASTRODUODENOSCOPY (EGD);  Surgeon: Rogene Houston, MD;  [facility];  Service: Endoscopy;  Laterality: N/A;  1225   Current Outpatient Medications on File Prior to Visit  Medication Sig Dispense Refill   albuterol (PROVENTIL) (2.5 MG/3ML) 0.083% nebulizer solution Take 3 mLs (2.5 mg total) by nebulization every 4 (four) hours as needed for wheezing or shortness of breath. 75 mL 12   albuterol (VENTOLIN HFA) 108 (90 Base) MCG/ACT inhaler Inhale 2 puffs into the lungs every 6 (six) hours as needed for wheezing or shortness of breath. 18 g 6   Budeson-Glycopyrrol-Formoterol (BREZTRI AEROSPHERE) 160-9-4.8 MCG/ACT AERO Inhale 2  puffs into the lungs in the morning and at bedtime. 10.7 g 11   furosemide (LASIX) 40 MG tablet TAKE 1 TABLET(40 MG) BY MOUTH DAILY 90 tablet 1   OXYGEN Inhale 3 L into the lungs daily. And 3 lpm with exertion     pantoprazole (PROTONIX) 40 MG tablet TAKE 1 TABLET(40 MG) BY MOUTH IN THE MORNING AND 30 MINUTES BEFORE BREAKFAST 90 tablet 3   acetaminophen (TYLENOL) 325 MG tablet Take 2 tablets (650 mg total) by mouth every 6 (six) hours as needed for mild pain (or Fever >/= 101). (Patient not taking: Reported on 09/17/2021) 12 tablet 0   spironolactone (ALDACTONE) 25 MG tablet Take 1 tablet (25 mg total) by mouth daily. (Patient not taking: Reported on 09/17/2021) 30 tablet 5   triamcinolone cream (KENALOG) 0.1 %  (Patient not taking: Reported on 09/17/2021)     No current facility-administered medications on file prior to visit.   No Known Allergies   Review of Systems  Respiratory:  Negative for apnea, chest tightness and shortness of breath.   Cardiovascular:  Positive for leg swelling. Negative for chest pain.  Skin:  Positive for rash.  All other systems reviewed and are negative.      Objective:   Physical Exam Vitals and nursing note reviewed.  Constitutional:      Appearance: Normal appearance.  HENT:     Head: Normocephalic and atraumatic.     Comments: Well-demarcated erythematous papules/plaques with silvery scale, guttate pink scaly papules to scalp  Cardiovascular:     Rate and Rhythm: Normal rate and regular rhythm.     Pulses: Normal pulses.     Heart sounds: Normal heart sounds.  Pulmonary:     Effort: Pulmonary effort is normal.     Comments: Breath sounds diminished Skin:    Comments: Well-demarcated erythematous papules/plaques with silvery scale, guttate pink scaly papules diffuse down his mid back and anterior mid scalp.  Neurological:     Mental Status: He is alert.  Psychiatric:        Mood and Affect: Mood normal.        Behavior: Behavior normal.         Thought Content: Thought content normal.           Assessment & Plan:  Psoriasis - Plan: Ambulatory referral to Dermatology Patient has failed topical treatment and requests oral medication management. Given his psoriasis is worsening I will prescribe [place] Nail and refer to dermatology for further management.  Acute congestive heart failure, unspecified heart failure type ([place]) - Plan: CBC with Differential/Platelet, COMPLETE METABOLIC PANEL WITH GFR, Lipid panel Being followed by cardiology. Continue Lasix.   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 Followed by pulmonology and oncology.

## 2021-09-16 NOTE — Telephone Encounter (Signed)
Appt made with Mila Merry, NP per Dr. Samella Parr order. Pt advised and verbalized understanding of all.

## 2021-09-17 ENCOUNTER — Ambulatory Visit (INDEPENDENT_AMBULATORY_CARE_PROVIDER_SITE_OTHER): Payer: Medicare Other | Admitting: Family Medicine

## 2021-09-17 VITALS — BP 138/68 | HR 97 | Temp 97.9°F | Ht 68.0 in | Wt 230.0 lb

## 2021-09-17 DIAGNOSIS — L409 Psoriasis, unspecified: Secondary | ICD-10-CM

## 2021-09-17 DIAGNOSIS — Z13220 Encounter for screening for lipoid disorders: Secondary | ICD-10-CM

## 2021-09-17 DIAGNOSIS — J449 Chronic obstructive pulmonary disease, unspecified: Secondary | ICD-10-CM

## 2021-09-17 DIAGNOSIS — I509 Heart failure, unspecified: Secondary | ICD-10-CM

## 2021-09-17 DIAGNOSIS — J441 Chronic obstructive pulmonary disease with (acute) exacerbation: Secondary | ICD-10-CM

## 2021-09-17 DIAGNOSIS — C3411 Malignant neoplasm of upper lobe, right bronchus or lung: Secondary | ICD-10-CM | POA: Diagnosis not present

## 2021-09-17 MED ORDER — APREMILAST 10 & 20 & 30 MG PO TBPK: ORAL_TABLET | ORAL | 3 refills | Status: DC

## 2021-09-17 NOTE — Progress Notes (Deleted)
Chronic Care Management Pharmacy Note  09/17/2021 Name:  Randall Sawyer MRN:  [id number] DOB:  1955/07/14  Summary: Initial visit with PharmD.  Approved for PAP for Breztri - reports consistent use.  He is about to begin radiation for lung cancer.  Being followed by cards for CHF.  Appears to have some swelling in legs at this time - adherent with Lasix.  Recommendations/Changes made from today's visit: No changes at this time  Plan: FU 6 months   Subjective: Randall Sawyer is an 66 y.o. year old male who is a primary patient of [name], Cammie Mcgee, MD.  The CCM team was consulted for assistance with disease management and care coordination needs.    Engaged with patient face to face for initial visit in response to provider referral for pharmacy case management and/or care coordination services.   Consent to Services:  The patient was given the following information about Chronic Care Management services today, agreed to services, and gave verbal consent: 1. CCM service includes personalized support from designated clinical staff supervised by the primary care provider, including individualized plan of care and coordination with other care providers 2. 24/7 contact phone numbers for assistance for urgent and routine care needs. 3. Service will only be billed when office clinical staff spend 20 minutes or more in a month to coordinate care. 4. Only one practitioner may furnish and bill the service in a calendar month. 5.The patient may stop CCM services at any time (effective at the end of the month) by phone call to the office staff. 6. The patient will be responsible for cost sharing (co-pay) of up to 20% of the service fee (after annual deductible is met). Patient agreed to services and consent obtained.  Patient Care Team: Susy Frizzle, MD as PCP - General (Family Medicine) Satira Sark, MD as Consulting Physician (Cardiology) Edythe Clarity, [facility] as Pharmacist  (Pharmacist)  Recent office visits:  03/04/21 Annual Medicare Wellness Completed   01/07/21 Jenna Luo, MD - Family Medicine - Leg swelling - Labs were ordered. Try Breztri in place of [place] for better COPD treatment. Follow up as scheduled.    Recent consult visits:  02/05/21 [facility] - Radiation Oncology - No notes available.    02/05/21 [name] - Hematology/Oncology - SCC - No notes available.    02/01/21 [name] - Pulmonology - Follow up as scheduled.    10/30/20 [name] - Cardiothoracic Surgery - No notes available.    09/28/20 Christinia Gully, MD - Pulmonology - Labs were ordered. predniSONE (DELTASONE) 10 MG tablet was prescribed. Follow up in 6 months.   Hospital visits:  None in previous 6 months   Objective:  Lab Results  Component Value Date   CREATININE 1.33 01/15/2021   BUN 11 01/15/2021   GFR 59.81 (L) 11/15/2016   EGFR 70 01/07/2021   GFRNONAA >60 01/02/2020   GFRAA >89 11/03/2014   NA 135 01/15/2021   K 4.6 01/15/2021   CALCIUM 8.9 01/15/2021   CO2 38 (H) 01/15/2021   GLUCOSE 100 (H) 01/15/2021    Lab Results  Component Value Date/Time   GFR 59.81 (L) 11/15/2016 10:24 AM   GFR 52.81 (L) 02/09/2016 09:35 AM    Last diabetic Eye exam: No results found for: "HMDIABEYEEXA"  Last diabetic Foot exam: No results found for: "HMDIABFOOTEX"   Lab Results  Component Value Date   CHOL 208 (H) 07/28/2020   HDL 44 07/28/2020   LDLCALC 133 (  H) 07/28/2020   TRIG 170 (H) 07/28/2020   CHOLHDL 4.7 07/28/2020       Latest Ref Rng & Units 01/07/2021    2:43 PM 07/28/2020    8:46 AM 12/29/2019    1:10 PM  Hepatic Function  Total Protein 6.1 - 8.1 g/dL 7.4  7.3  8.3   Albumin 3.5 - 5.0 g/dL   3.5   AST 10 - 35 U/L 11  11  16    ALT 9 - 46 U/L 5  8  17    Alk Phosphatase 38 - 126 U/L   125   Total Bilirubin 0.2 - 1.2 mg/dL 0.4  0.5  0.7     Lab Results  Component Value Date/Time   TSH 1.05 08/31/2015 09:35 AM   TSH 1.06 09/03/2014  02:12 PM       Latest Ref Rng & Units 01/07/2021    2:43 PM 07/28/2020    8:46 AM 01/01/2020    5:41 AM  CBC  WBC 3.8 - 10.8 Thousand/uL 10.4  8.1  13.9   Hemoglobin 13.2 - 17.1 g/dL 9.9  11.4  10.2   Hematocrit 38.5 - 50.0 % 33.0  35.2  32.7   Platelets 140 - 400 Thousand/uL 336  345  333     No results found for: "VD25OH"  Clinical ASCVD: Yes  The 10-year ASCVD risk score (Arnett DK, et al., 2019) is: 26.7%   Values used to calculate the score:     Age: 66 years     Sex: Male     Is Non-Hispanic African American: No     Diabetic: No     Tobacco smoker: No     Systolic Blood Pressure: 952 mmHg     Is BP treated: Yes     HDL Cholesterol: 44 mg/dL     Total Cholesterol: 208 mg/dL       03/04/2021   11:56 AM 07/31/2020    3:14 PM 11/22/2016    2:17 PM  Depression screen PHQ 2/9  Decreased Interest 0 0 0  Down, Depressed, Hopeless 0 0 0  PHQ - 2 Score 0 0 0     Social History   Tobacco Use  Smoking Status Former   Packs/day: 0.25   Years: 51.00   Total pack years: 12.75   Types: Cigarettes   Start date: 12/30/1964   Quit date: 05/21/2017   Years since quitting: 4.3  Smokeless Tobacco Never   BP Readings from Last 3 Encounters:  04/13/21 138/70  01/07/21 (!) 142/68  09/28/20 136/68   Pulse Readings from Last 3 Encounters:  04/13/21 (!) 103  01/07/21 94  09/28/20 88   Wt Readings from Last 3 Encounters:  04/13/21 244 lb (110.7 kg)  03/04/21 239 lb (108.4 kg)  01/07/21 239 lb (108.4 kg)   BMI Readings from Last 3 Encounters:  04/13/21 37.10 kg/m  03/04/21 36.34 kg/m  01/07/21 36.34 kg/m    Assessment/Interventions: Review of patient past medical history, allergies, medications, health status, including review of consultants reports, laboratory and other test data, was performed as part of comprehensive evaluation and provision of chronic care management services.   SDOH:  (Social Determinants of Health) assessments and interventions performed:  Yes SDOH Interventions    Flowsheet Row Clinical Support from 03/04/2021 in [place] Interventions   Food Insecurity Interventions Intervention Not Indicated  Housing Interventions Intervention Not Indicated  Transportation Interventions Intervention Not Indicated  Financial Strain Interventions Intervention Not  Indicated  Physical Activity Interventions Intervention Not Indicated  Stress Interventions Intervention Not Indicated  Social Connections Interventions Intervention Not Indicated      Financial Resource Strain: Low Risk  (03/04/2021)   Overall Financial Resource Strain (CARDIA)    Difficulty of Paying Living Expenses: Not hard at all   Food Insecurity: No Food Insecurity (03/04/2021)   Hunger Vital Sign    Worried About Running Out of Food in the Last Year: Never true    [place] in the Last Year: Never true    SDOH Screenings   Food Insecurity: No Food Insecurity (03/04/2021)  Housing: Low Risk  (03/04/2021)  Transportation Needs: Unknown (03/04/2021)  Alcohol Screen: Low Risk  (03/04/2021)  Depression (PHQ2-9): Low Risk  (03/04/2021)  Financial Resource Strain: Low Risk  (03/04/2021)  Physical Activity: Insufficiently Active (03/04/2021)  Social Connections: Moderately Integrated (03/04/2021)  Stress: No Stress Concern Present (03/04/2021)  Tobacco Use: Medium Risk (04/13/2021)    CCM Care Plan  No Known Allergies  Medications Reviewed Today     Reviewed by Tanda Rockers, MD (Physician) on 04/13/21 at [phone]  Med List Status: <None>   Medication Order Taking? Sig Documenting Provider Last Dose Status Informant  acetaminophen (TYLENOL) 325 MG tablet [id number] Yes Take 2 tablets (650 mg total) by mouth every 6 (six) hours as needed for mild pain (or Fever >/= 101). Roxan Hockey, MD Taking Active   albuterol (PROVENTIL) (2.5 MG/3ML) 0.083% nebulizer solution [id number] Yes Take 3 mLs (2.5 mg total) by nebulization every 4 (four) hours as  needed for wheezing or shortness of breath. Tanda Rockers, MD  Active   Budeson-Glycopyrrol-Formoterol (BREZTRI AEROSPHERE) 160-9-4.8 MCG/ACT Hollie Salk [id number] Yes Inhale 2 puffs into the lungs in the morning and at bedtime. Tanda Rockers, MD Taking Active   furosemide (LASIX) 40 MG tablet [id number] Yes TAKE 1 TABLET(40 MG) BY MOUTH DAILY Susy Frizzle, MD Taking Active   OXYGEN [id number] Yes Inhale 3 L into the lungs daily. And 3 lpm with exertion [provider] Taking Active Self  pantoprazole (PROTONIX) 40 MG tablet [id number] Yes TAKE 1 TABLET(40 MG) BY MOUTH IN THE MORNING AND 30 MINUTES BEFORE BREAKFAST Susy Frizzle, MD Taking Active   PROVENTIL HFA 108 [id number] Base) MCG/ACT inhaler [id number] Yes Inhale 2 puffs into the lungs every 4 (four) hours as needed for shortness of breath. Tanda Rockers, MD Taking Active   spironolactone (ALDACTONE) 25 MG tablet [id number] Yes Take 1 tablet (25 mg total) by mouth daily. 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[place])    Dependence on nocturnal oxygen therapy 05/16/2017   GERD (gastroesophageal reflux disease) 05/16/2017   Tobacco use 05/16/2017   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     Medication Assistance: Application for Breztri  medication assistance program. in process.  Anticipated assistance start date is today.  See plan of care for additional detail.  Compliance/Adherence/Medication fill history: Care Gaps: None  Star-Rating Drugs: N/A  Patient's preferred pharmacy is:  [id number] - , [place] - [address] AT [place]. [place] [address] Phone: [phone] Fax: [phone]  [facility], [place] [address] Phone: [phone] Fax: [phone]  [place] [address] [place] - [place] [address] [place] [address] [place] [address] Phone: [phone] Fax: [phone]  Uses pill box? No - takes out of vials Pt endorses 100% compliance  We discussed: Benefits of medication synchronization, packaging and delivery as well as enhanced pharmacist oversight with [employer]. Patient decided to: Continue current medication management strategy  Care Plan and Follow Up Patient Decision:  Patient agrees to Care Plan and Follow-up.  Plan: The care management team will reach out to the patient again over the next 180 days.  Beverly Milch, PharmD, CPP Clinical Pharmacist Practitioner [facility] [phone]    Current Barriers:  Lung cancer, COPD  Pharmacist Clinical Goal(s):  Patient will achieve improvement in breathing as evidenced by symptoms through collaboration with PharmD and provider.   Interventions: 1:1 collaboration with Susy Frizzle, MD  regarding development and update of comprehensive plan of care as evidenced by provider attestation and co-signature Inter-disciplinary care team collaboration (see longitudinal plan of care) Comprehensive medication review performed; medication list updated in electronic medical record  Hyperlipidemia: (LDL goal < 100) -Uncontrolled -Current treatment: Atorvastatin 71m {Appropriate:26852::"Appropriate"}, {Effective:26853::"Effective"}, {Safe:26854::"Safe"}, {accessible:26855::"Accessible"} -Medications previously tried: none noted  -Current dietary patterns: limiting salt, does not eat potato chips, daughter is watching what he eats -Current exercise habits: walks dog occasionally -Educated on Cholesterol goals;  Benefits of statin for ASCVD risk reduction; Importance of limiting foods high in cholesterol; -He has previously declined cholesterol medication. Current ASCVD risk 21.9% - high risk.  He would be candidate for high intensity statin based off this.  Continues to decline.  Recommend recheck lipids and continue to recommend treatment as necessary.  Currently going through radiation for lung cancer.  Let patient get through this then readdress.  COPD (Goal: control symptoms and prevent exacerbations) -Controlled -Current treatment  Breztri 160-9-4.8 mcg/aer morning and hs Appropriate, Effective, Safe, Accessible Duoneb 0.5-2.556m3ml prn Appropriate, Effective, Safe, Accessible Proventil HFA 9019mdaily Appropriate, Effective, Safe, Accessible -Medications previously tried: BevFinancial controllerPulmonary function testing: Pulmonary Functions Testing Results:  No results found for: FEV1, FVC, FEV1FVC, TLC, DLCO  -Exacerbations requiring treatment in last 6 months: none -Patient reports consistent use of maintenance inhaler -Frequency of rescue inhaler use: 3-4 times per week during walking/over exertion -Counseled on Proper inhaler technique; Benefits of consistent maintenance inhaler  use When to use rescue inhaler Differences between maintenance and rescue inhalers -Recommended to continue current medication Assessed patient finances. Applied and approved for [place] patient assistance for BreHome DepotPatient will receive free  inhalers delivered to his house for remainder of year 2023.  Tobacco use (Goal: Smoking cessation) -Controlled -Current treatment  none -Patient has reported that he has not smoked a cigarette since his hospital stay.  Breathing has improved but he does report some weight gain. -Congratulated patient for this success - no other changes at this time continue to check in and encourage.  GERD (Goal: Minimize symptoms) -Controlled -Current treatment  Pantoprazole 11m twice daily -Medications previously tried: none noted -takes appropriately, no current symptoms  -Recommended to continue current medication  Patient Goals/Self-Care Activities Patient will:  - take medications as prescribed as evidenced by patient report and record review engage in dietary modifications by limiting salt intake to promote euvolemia  Follow Up Plan: The care management team will reach out to the patient again over the next 180 days.

## 2021-09-18 DIAGNOSIS — J449 Chronic obstructive pulmonary disease, unspecified: Secondary | ICD-10-CM | POA: Diagnosis not present

## 2021-09-18 LAB — CBC WITH DIFFERENTIAL/PLATELET
Absolute Monocytes: 687 cells/uL (ref 200–950)
Basophils Absolute: 52 cells/uL (ref 0–200)
Basophils Relative: 0.6 %
Eosinophils Absolute: 261 cells/uL (ref 15–500)
Eosinophils Relative: 3 %
HCT: 32.7 % — ABNORMAL LOW (ref 38.5–50.0)
Hemoglobin: 10.5 g/dL — ABNORMAL LOW (ref 13.2–17.1)
Lymphs Abs: 827 cells/uL — ABNORMAL LOW (ref 850–3900)
MCH: 26.6 pg — ABNORMAL LOW (ref 27.0–33.0)
MCHC: 32.1 g/dL (ref 32.0–36.0)
MCV: 83 fL (ref 80.0–100.0)
MPV: 10.8 fL (ref 7.5–12.5)
Monocytes Relative: 7.9 %
Neutro Abs: 6873 cells/uL (ref 1500–7800)
Neutrophils Relative %: 79 %
Platelets: 317 10*3/uL (ref 140–400)
RBC: 3.94 10*6/uL — ABNORMAL LOW (ref 4.20–5.80)
RDW: 16.5 % — ABNORMAL HIGH (ref 11.0–15.0)
Total Lymphocyte: 9.5 %
WBC: 8.7 10*3/uL (ref 3.8–10.8)

## 2021-09-18 LAB — LIPID PANEL
Cholesterol: 206 mg/dL — ABNORMAL HIGH (ref <200)
HDL: 37 mg/dL — ABNORMAL LOW (ref >=40)
LDL Cholesterol (Calc): 130 mg/dL (calc) — ABNORMAL HIGH
Non-HDL Cholesterol (Calc): 169 mg/dL (calc) — ABNORMAL HIGH (ref <130)
Total CHOL/HDL Ratio: 5.6 (calc) — ABNORMAL HIGH (ref <5.0)
Triglycerides: 253 mg/dL — ABNORMAL HIGH (ref <150)

## 2021-09-18 LAB — COMPLETE METABOLIC PANEL WITH GFR
AG Ratio: 1.3 (calc) (ref 1.0–2.5)
ALT: 15 U/L (ref 9–46)
AST: 12 U/L (ref 10–35)
Albumin: 4.4 g/dL (ref 3.6–5.1)
Alkaline phosphatase (APISO): 130 U/L (ref 35–144)
BUN/Creatinine Ratio: 8 (calc) (ref 6–22)
BUN: 12 mg/dL (ref 7–25)
CO2: 36 mmol/L — ABNORMAL HIGH (ref 20–32)
Calcium: 9.3 mg/dL (ref 8.6–10.3)
Chloride: 94 mmol/L — ABNORMAL LOW (ref 98–110)
Creat: 1.44 mg/dL — ABNORMAL HIGH (ref 0.70–1.35)
Globulin: 3.5 g/dL (calc) (ref 1.9–3.7)
Glucose, Bld: 80 mg/dL (ref 65–99)
Potassium: 4.2 mmol/L (ref 3.5–5.3)
Sodium: 138 mmol/L (ref 135–146)
Total Bilirubin: 0.3 mg/dL (ref 0.2–1.2)
Total Protein: 7.9 g/dL (ref 6.1–8.1)
eGFR: 53 mL/min/{1.73_m2} — ABNORMAL LOW (ref >=60)

## 2021-09-20 ENCOUNTER — Encounter: Payer: Self-pay | Admitting: Family Medicine

## 2021-09-20 ENCOUNTER — Other Ambulatory Visit: Payer: Self-pay

## 2021-09-20 ENCOUNTER — Telehealth: Payer: Self-pay

## 2021-09-20 DIAGNOSIS — E789 Disorder of lipoprotein metabolism, unspecified: Secondary | ICD-10-CM

## 2021-09-20 MED ORDER — ATORVASTATIN CALCIUM 20 MG PO TABS: 20.0000 mg | ORAL_TABLET | Freq: Every day | ORAL | 3 refills | Status: DC

## 2021-09-20 NOTE — Telephone Encounter (Signed)
Pt called in stating that this med Apremilast 10 & 20 & 30 MG TBPK [[id number]]    Order Details Dose, Route, Frequency: As Directed  Dispense Quantity: 55 each Refills: 3   Indications of Use: Plaque Psoriasis       Sig: Day 1, 10 mg orally in AM; Day 2, 10 mg twice daily; Day 3, 10 mg in AM, 20 mg in PM; Day 4, 20 mg twice daily; Day 5, 20 mg in AM, 30 mg in PM , Day 6 and thereafter, 30 mg orally twice daily      Has been denied by his insurance. Pt states he would like to know if there is another med available that he could take that would be covered by his insurance. Please advise.  Cb#: [phone]  Pharmacy: [facility] on [facility] in [place].

## 2021-09-20 NOTE — Telephone Encounter (Signed)
Pls advice  

## 2021-09-20 NOTE — Telephone Encounter (Signed)
Spoke w/pt oral alternatives would have to be prescribed his Dermatologist.   Per pt he will call and make an appt w/his derm.   Nothing further needed.

## 2021-09-23 ENCOUNTER — Telehealth: Payer: Self-pay

## 2021-09-24 DIAGNOSIS — R911 Solitary pulmonary nodule: Secondary | ICD-10-CM | POA: Diagnosis not present

## 2021-09-24 DIAGNOSIS — J9611 Chronic respiratory failure with hypoxia: Secondary | ICD-10-CM | POA: Diagnosis not present

## 2021-09-24 DIAGNOSIS — J449 Chronic obstructive pulmonary disease, unspecified: Secondary | ICD-10-CM | POA: Diagnosis not present

## 2021-09-24 DIAGNOSIS — R0902 Hypoxemia: Secondary | ICD-10-CM | POA: Diagnosis not present

## 2021-09-24 DIAGNOSIS — R918 Other nonspecific abnormal finding of lung field: Secondary | ICD-10-CM | POA: Diagnosis not present

## 2021-09-30 ENCOUNTER — Ambulatory Visit: Payer: Medicare Other | Admitting: Pharmacist

## 2021-09-30 DIAGNOSIS — J449 Chronic obstructive pulmonary disease, unspecified: Secondary | ICD-10-CM | POA: Diagnosis not present

## 2021-09-30 DIAGNOSIS — E789 Disorder of lipoprotein metabolism, unspecified: Secondary | ICD-10-CM

## 2021-09-30 NOTE — Progress Notes (Signed)
Chronic Care Management Pharmacy Note  09/30/2021 Name:  Randall Sawyer MRN:  [id number] DOB:  20-Dec-1955  Summary: PharmD follow up visit.  He was started on Lipitor and tolerating well.  Recommended he come back for FU labs at least within 6 months from start of statin.  Breathing well, now using Breztri as prescribed.  Recommendations/Changes made from today's visit: Recheck lipids within 6 months - can increase to 23m for high intensity if still not at goal  Plan: FU 6 months   Subjective: Randall MONTEis an 66y.o. year old male who is a primary patient of [name], WCammie Mcgee Sawyer.  The CCM team was consulted for assistance with disease management and care coordination needs.    Engaged with patient face to face for follow up visit in response to provider referral for pharmacy case management and/or care coordination services.   Consent to Services:  The patient was given the following information about Chronic Care Management services today, agreed to services, and gave verbal consent: 1. CCM service includes personalized support from designated clinical staff supervised by the primary care provider, including individualized plan of care and coordination with other care providers 2. 24/7 contact phone numbers for assistance for urgent and routine care needs. 3. Service will only be billed when office clinical staff spend 20 minutes or more in a month to coordinate care. 4. Only one practitioner may furnish and bill the service in a calendar month. 5.The patient may stop CCM services at any time (effective at the end of the month) by phone call to the office staff. 6. The patient will be responsible for cost sharing (co-pay) of up to 20% of the service fee (after annual deductible is met). Patient agreed to services and consent obtained.  Patient Care Team: PSusy Frizzle Sawyer as PCP - General (Family Medicine) MSatira Sark Sawyer as Consulting Physician  (Cardiology) DEdythe Clarity [facility] (Pharmacist)  Recent office visits:  03/04/21 Annual Medicare Wellness Completed   01/07/21 Randall Sawyer - Family Medicine - Leg swelling - Labs were ordered. [place]. Follow up as scheduled.    Recent consult visits:  02/05/21 [facility] - Radiation Oncology - No notes available.    02/05/21 LAlger Simons- Hematology/Oncology - [facility] - No notes available.    02/01/21 Randall Sawyer- Pulmonology - Follow up as scheduled.    10/30/20 Randall Sawyer- Cardiothoracic Surgery - No notes available.    09/28/20 Randall Gully Sawyer - Pulmonology - Labs were ordered. predniSONE (DELTASONE) 10 MG tablet was prescribed. Follow up in 6 months.   Hospital visits:  None in previous 6 months   Objective:  Lab Results  Component Value Date   CREATININE 1.44 (H) 09/17/2021   BUN 12 09/17/2021   GFR 59.81 (L) 11/15/2016   EGFR 53 (L) 09/17/2021   GFRNONAA >60 01/02/2020   GFRAA >89 11/03/2014   NA 138 09/17/2021   K 4.2 09/17/2021   CALCIUM 9.3 09/17/2021   CO2 36 (H) 09/17/2021   GLUCOSE 80 09/17/2021    Lab Results  Component Value Date/Time   GFR 59.81 (L) 11/15/2016 10:24 AM   GFR 52.81 (L) 02/09/2016 09:35 AM    Last diabetic Eye exam: No results found for: "HMDIABEYEEXA"  Last diabetic Foot exam: No results found for: "HMDIABFOOTEX"   Lab Results  Component Value Date   CHOL 206 (H) 09/17/2021  HDL 37 (L) 09/17/2021   LDLCALC 130 (H) 09/17/2021   TRIG 253 (H) 09/17/2021   CHOLHDL 5.6 (H) 09/17/2021       Latest Ref Rng & Units 09/17/2021   11:02 AM 01/07/2021    2:43 PM 07/28/2020    8:46 AM  Hepatic Function  Total Protein 6.1 - 8.1 g/dL 7.9  7.4  7.3   AST 10 - 35 U/L _0 ALT 9 - 46 U/L _1 Total Bilirubin 0.2 - 1.2 mg/dL 0.3  0.4  0.5     Lab Results  Component Value Date/Time   TSH 1.05 08/31/2015 09:35 AM   TSH 1.06 09/03/2014 02:12 PM        Latest Ref Rng & Units 09/17/2021   11:02 AM 01/07/2021    2:43 PM 07/28/2020    8:46 AM  CBC  WBC 3.8 - 10.8 Thousand/uL 8.7  10.4  8.1   Hemoglobin 13.2 - 17.1 g/dL 10.5  9.9  11.4   Hematocrit 38.5 - 50.0 % 32.7  33.0  35.2   Platelets 140 - 400 Thousand/uL 317  336  345     No results found for: "VD25OH"  Clinical ASCVD: Yes  The 10-year ASCVD risk score (Arnett DK, et al., 2019) is: 22.7%   Values used to calculate the score:     Age: 66 years     Sex: Male     Is Non-Hispanic African American: No     Diabetic: No     Tobacco smoker: No     Systolic Blood Pressure: 062 mmHg     Is BP treated: Yes     HDL Cholesterol: 37 mg/dL     Total Cholesterol: 206 mg/dL       09/17/2021   10:44 AM 03/04/2021   11:56 AM 07/31/2020    3:14 PM  Depression screen PHQ 2/9  Decreased Interest  0 0  Down, Depressed, Hopeless 0 0 0  PHQ - 2 Score 0 0 0  Altered sleeping 0    Tired, decreased energy 0    Change in appetite 0    Feeling bad or failure about yourself  0    Trouble concentrating 0    Moving slowly or fidgety/restless 0    Suicidal thoughts 0    PHQ-9 Score 0    Difficult doing work/chores Not difficult at all       Social History   Tobacco Use  Smoking Status Former   Packs/day: 0.25   Years: 51.00   Total pack years: 12.75   Types: Cigarettes   Start date: 12/30/1964   Quit date: 05/21/2017   Years since quitting: 4.3  Smokeless Tobacco Never   BP Readings from Last 3 Encounters:  09/17/21 138/68  04/13/21 138/70  01/07/21 (!) 142/68   Pulse Readings from Last 3 Encounters:  09/17/21 97  04/13/21 (!) 103  01/07/21 94   Wt Readings from Last 3 Encounters:  09/17/21 230 lb (104.3 kg)  04/13/21 244 lb (110.7 kg)  03/04/21 239 lb (108.4 kg)   BMI Readings from Last 3 Encounters:  09/17/21 34.97 kg/m  04/13/21 37.10 kg/m  03/04/21 36.34 kg/m    Assessment/Interventions: Review of patient past medical history, allergies, medications,  health status, including review of consultants reports, laboratory and other test data, was performed as part of comprehensive evaluation and provision of chronic care management services.   SDOH:  (Social Determinants  of Health) assessments and interventions performed: No Financial Resource Strain: Low Risk  (03/04/2021)   Overall Financial Resource Strain (CARDIA)    Difficulty of Paying Living Expenses: Not hard at all    SDOH Interventions    Flowsheet Row Clinical Support from 03/04/2021 in [place] Interventions   Food Insecurity Interventions Intervention Not Indicated  Housing Interventions Intervention Not Indicated  Transportation Interventions Intervention Not Indicated  Financial Strain Interventions Intervention Not Indicated  Physical Activity Interventions Intervention Not Indicated  Stress Interventions Intervention Not Indicated  Social Connections Interventions Intervention Not Indicated      Financial Resource Strain: Low Risk  (03/04/2021)   Overall Financial Resource Strain (CARDIA)    Difficulty of Paying Living Expenses: Not hard at all   Food Insecurity: No Food Insecurity (03/04/2021)   Hunger Vital Sign    Worried About Running Out of Food in the Last Year: Never true    Ran Out of Food in the Last Year: Never true    SDOH Screenings   Food Insecurity: No Food Insecurity (03/04/2021)  Housing: Low Risk  (03/04/2021)  Transportation Needs: Unknown (03/04/2021)  Alcohol Screen: Low Risk  (03/04/2021)  Depression (PHQ2-9): Low Risk  (09/17/2021)  Financial Resource Strain: Low Risk  (03/04/2021)  Physical Activity: Insufficiently Active (03/04/2021)  Social Connections: Moderately Integrated (03/04/2021)  Stress: No Stress Concern Present (03/04/2021)  Tobacco Use: Medium Risk (09/20/2021)    CCM Care Plan  No Known Allergies  Medications Reviewed Today     Reviewed by Edythe Clarity, [facility] (Pharmacist) on 09/30/21 at 1215  Med  List Status: <None>   Medication Order Taking? Sig Documenting Provider Last Dose Status Informant  acetaminophen (TYLENOL) 325 MG tablet [id number] Yes Take 2 tablets (650 mg total) by mouth every 6 (six) hours as needed for mild pain (or Fever >/= 101). Roxan Hockey, Sawyer Taking Active   albuterol (PROVENTIL) (2.5 MG/3ML) 0.083% nebulizer solution [id number] Yes Take 3 mLs (2.5 mg total) by nebulization every 4 (four) hours as needed for wheezing or shortness of breath. Tanda Rockers, Sawyer Taking Active   albuterol (VENTOLIN HFA) 108 (90 Base) MCG/ACT inhaler [id number] Yes Inhale 2 puffs into the lungs every 6 (six) hours as needed for wheezing or shortness of breath. Tanda Rockers, Sawyer Taking Active   Apremilast 10 & 20 & 30 MG TBPK [id number] Yes Day 1, 10 mg orally in AM; Day 2, 10 mg twice daily; Day 3, 10 mg in AM, 20 mg in PM; Day 4, 20 mg twice daily; Day 5, 20 mg in AM, 30 mg in PM , Day 6 and thereafter, 30 mg orally twice daily Mila Merry S, FNP Taking Active   atorvastatin (LIPITOR) 20 MG tablet [id number] Yes Take 1 tablet (20 mg total) by mouth daily. Rubie Maid, FNP Taking Active   Budeson-Glycopyrrol-Formoterol (BREZTRI AEROSPHERE) 160-9-4.8 MCG/ACT Hollie Salk [id number] Yes Inhale 2 puffs into the lungs in the morning and at bedtime. Tanda Rockers, Sawyer Taking Active   furosemide (LASIX) 40 MG tablet [id number] Yes TAKE 1 TABLET(40 MG) BY MOUTH DAILY Susy Frizzle, Sawyer Taking Active   OXYGEN [id number] Yes Inhale 3 L into the lungs daily. And 3 lpm with exertion Provider, Historical, Sawyer Taking Active Self  pantoprazole (PROTONIX) 40 MG tablet [id number] Yes TAKE 1 TABLET(40 MG) BY MOUTH IN THE MORNING AND 30 MINUTES BEFORE BREAKFAST Susy Frizzle, Sawyer Taking Active   spironolactone (ALDACTONE) 25 MG tablet [id number] Yes  Take 1 tablet (25 mg total) by mouth daily. 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[place])    Dependence on nocturnal oxygen therapy 05/16/2017   GERD (gastroesophageal reflux disease) 05/16/2017   Tobacco use 05/16/2017   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   Bone mass 03/28/2017   Pulmonary infiltrates 03/02/2017   Chronic respiratory failure with hypoxia ([place]) 07/27/2014   Bilateral leg edema 02/14/2014   Dyspnea 01/21/2014   Kidney stones    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     Immunization History  Administered Date(s) Administered   Influenza Split 11/11/2014, 10/11/2015   Moderna Sars-Covid-2 Vaccination 09/06/2019, 10/04/2019   Pneumococcal Polysaccharide-23 11/03/2014    Conditions to be addressed/monitored:  COPD, GERD, HLD, Tobacco Use  Care Plan : General Pharmacy (Adult)  Updates made by Edythe Clarity, RPH since 09/30/2021 12:00 AM     Problem: COPD, GERD, HLD, Tobacco Use   Priority: High  Onset Date: 03/11/2021     Long-Range Goal: Patient-Specific Goal   Start Date: 03/11/2021  Expected End Date: 09/11/2021  Recent Progress: On track  Priority: High  Note:   Current Barriers:  Lung cancer, elevated LDL - now on statin  Pharmacist Clinical Goal(s):  Patient will achieve improvement in breathing as evidenced by symptoms through collaboration with PharmD and provider.   Interventions: 1:1 collaboration with Susy Frizzle, Sawyer regarding development and update of comprehensive plan of care as evidenced by provider attestation and co-signature Inter-disciplinary care team collaboration (see longitudinal plan of  care) Comprehensive medication review performed; medication list updated in electronic medical record  Hyperlipidemia: (LDL goal < 100) 09/30/21 -Uncontrolled -Current treatment: Atorvastatin 22m Appropriate, Query effective, ,  -Medications previously tried: none noted  -Current dietary patterns: same see previous -Current exercise habits: walks dog occasionally -Educated on Cholesterol goals;  Benefits of statin for ASCVD risk reduction; Importance of limiting foods high in cholesterol; -Current 10-year ASCVD risk 22.7% - high risk -Was started on atorvastatin 290m(mod intensity) on 9/11.  I would recommend lipid panel about 6-8 weeks from this date to see full benefit.  If at that time he is still not at goal would increase to high intensity statin atorvastatin 4085mf patient willing. Happy to see him on statin at this time. No changes needed.  COPD (Goal: control symptoms and prevent exacerbations) 09/30/21 -Controlled -Current treatment  Breztri 160-9-4.8 mcg/aer morning and hs Appropriate, Effective, Safe, Accessible Duoneb 0.5-2.5mg44ml prn Appropriate, Effective, Safe, Accessible Proventil HFA 90mc71mily Appropriate, Effective, Safe, Accessible -Medications previously tried: BevesFinancial controllerlmonary function testing: Pulmonary Functions Testing Results: No results found for: FEV1, FVC, FEV1FVC, TLC, DLCO -Exacerbations requiring treatment in last 6 months: none -Patient reports consistent use of maintenance inhaler -Frequency of rescue inhaler use: 3-4 times per week during walking/over exertion -Counseled on Proper inhaler technique; -He continues to get Breztri delivered to him through PAP.  Denies any wheezing.  Only using rescue  when he over exerts himself.  Adherent with maintenance daily.  Will need to reapply end of 2023 to continue with Breztri PAP. Call scheduled with CMA for December to keep him on track with program. No changes at this time.  Tobacco use (Goal:  Smoking cessation) -Controlled -Current treatment  none -Patient has reported that he has not smoked a cigarette since his hospital stay.  Breathing has improved but he does report some weight gain. -Congratulated patient for this success - no other changes at this time continue to check in and encourage.  GERD (Goal: Minimize symptoms) -Controlled -Current treatment  Pantoprazole 53m twice daily -Medications previously tried: none noted -takes appropriately, no current symptoms  -Recommended to continue current medication  Patient Goals/Self-Care Activities Patient will:  - take medications as prescribed as evidenced by patient report and record review   Follow Up Plan: The care management team will reach out to the patient again over the next 180 days.           Medication Assistance: Application for Breztri  medication assistance program. in process.  Anticipated assistance start date is today.  See plan of care for additional detail.  Compliance/Adherence/Medication fill history: Care Gaps: Pneumonia Vaccine  Star-Rating Drugs: Atorvastatin 23m09/11/23 90ds  Patient's preferred pharmacy is:  [id number]: [phone]: [phone]: [phone]: [phone]hone: [phone]: [phone] pill box? No - takes out of vials Pt endorses 100% compliance  We discussed: Benefits of medication synchronization, packaging and delivery as well as enhanced pharmacist oversight with [employer]. Patient decided to: Continue current medication management strategy  Care Plan and Follow Up Patient Decision:  Patient agrees  to Care Plan and Follow-up.  Plan: The care management team will reach out to the patient again over the next 180 days.  ChBeverly MilchPharmD, CPP Clinical Pharmacist Practitioner [phone]

## 2021-09-30 NOTE — Patient Instructions (Addendum)
Visit Information   Goals Addressed             This Visit's Progress    Track and Manage My Symptoms-COPD   On track    Timeframe:  Long-Range Goal Priority:  High Start Date:    03/11/21                         Expected End Date:   09/11/21                    Follow Up Date 06/11/21    - develop a rescue plan - eliminate symptom triggers at home - follow rescue plan if symptoms flare-up - keep follow-up appointments    Why is this important?   Tracking your symptoms and other information about your health helps your doctor plan your care.  Write down the symptoms, the time of day, what you were doing and what medicine you are taking.  You will soon learn how to manage your symptoms.     Notes:        Patient Care Plan: General Pharmacy (Adult)     Problem Identified: COPD, GERD, HLD, Tobacco Use   Priority: High  Onset Date: 03/11/2021     Long-Range Goal: Patient-Specific Goal   Start Date: 03/11/2021  Expected End Date: 09/11/2021  Recent Progress: On track  Priority: High  Note:   Current Barriers:  Lung cancer, elevated LDL - now on statin  Pharmacist Clinical Goal(s):  Patient will achieve improvement in breathing as evidenced by symptoms through collaboration with PharmD and provider.   Interventions: 1:1 collaboration with Susy Frizzle, MD regarding development and update of comprehensive plan of care as evidenced by provider attestation and co-signature Inter-disciplinary care team collaboration (see longitudinal plan of care) Comprehensive medication review performed; medication list updated in electronic medical record  Hyperlipidemia: (LDL goal < 100) 09/30/21 -Uncontrolled -Current treatment: Atorvastatin 20mg  Appropriate, Query effective, ,  -Medications previously tried: none noted  -Current dietary patterns: same see previous -Current exercise habits: walks dog occasionally -Educated on Cholesterol goals;  Benefits of statin for ASCVD  risk reduction; Importance of limiting foods high in cholesterol; -Current 10-year ASCVD risk 22.7% - high risk -Was started on atorvastatin 20mg  (mod intensity) on 9/11.  I would recommend lipid panel about 6-8 weeks from this date to see full benefit.  If at that time he is still not at goal would increase to high intensity statin atorvastatin 40mg  if patient willing. Happy to see him on statin at this time. No changes needed.  COPD (Goal: control symptoms and prevent exacerbations) 09/30/21 -Controlled -Current treatment  Breztri 160-9-4.8 mcg/aer morning and hs Appropriate, Effective, Safe, Accessible Duoneb 0.5-2.5mg /16ml prn Appropriate, Effective, Safe, Accessible Proventil HFA 71mcg daily Appropriate, Effective, Safe, Accessible -Medications previously tried: Financial controller  -Pulmonary function testing: Pulmonary Functions Testing Results: No results found for: FEV1, FVC, FEV1FVC, TLC, DLCO -Exacerbations requiring treatment in last 6 months: none -Patient reports consistent use of maintenance inhaler -Frequency of rescue inhaler use: 3-4 times per week during walking/over exertion -Counseled on Proper inhaler technique; -He continues to get Breztri delivered to him through PAP.  Denies any wheezing.  Only using rescue when he over exerts himself.  Adherent with maintenance daily.  Will need to reapply end of 2023 to continue with Breztri PAP. Call scheduled with CMA for December to keep him on track with program. No changes at this time.  Tobacco use (  Goal: Smoking cessation) -Controlled -Current treatment  none -Patient has reported that he has not smoked a cigarette since his hospital stay.  Breathing has improved but he does report some weight gain. -Congratulated patient for this success - no other changes at this time continue to check in and encourage.  GERD (Goal: Minimize symptoms) -Controlled -Current treatment  Pantoprazole 40mg  twice daily -Medications previously tried:  none noted -takes appropriately, no current symptoms  -Recommended to continue current medication  Patient Goals/Self-Care Activities Patient will:  - take medications as prescribed as evidenced by patient report and record review   Follow Up Plan: The care management team will reach out to the patient again over the next 180 days.          The patient verbalized understanding of instructions, educational materials, and care plan provided today and DECLINED offer to receive copy of patient instructions, educational materials, and care plan.  Telephone follow up appointment with pharmacy team member scheduled for: 6 months  Edythe Clarity, [place], PharmD, [place] Clinical Pharmacist Practitioner [place] [phone]

## 2021-10-01 ENCOUNTER — Other Ambulatory Visit: Payer: Self-pay

## 2021-10-01 DIAGNOSIS — G4733 Obstructive sleep apnea (adult) (pediatric): Secondary | ICD-10-CM | POA: Diagnosis not present

## 2021-10-05 ENCOUNTER — Other Ambulatory Visit: Payer: Self-pay | Admitting: Family Medicine

## 2021-10-08 ENCOUNTER — Ambulatory Visit: Payer: Medicare Other | Admitting: Internal Medicine

## 2021-10-15 DIAGNOSIS — Z510 Encounter for antineoplastic radiation therapy: Secondary | ICD-10-CM | POA: Diagnosis not present

## 2021-10-15 DIAGNOSIS — C3411 Malignant neoplasm of upper lobe, right bronchus or lung: Secondary | ICD-10-CM | POA: Diagnosis not present

## 2021-10-15 DIAGNOSIS — C7949 Secondary malignant neoplasm of other parts of nervous system: Secondary | ICD-10-CM | POA: Diagnosis not present

## 2021-10-15 DIAGNOSIS — C3432 Malignant neoplasm of lower lobe, left bronchus or lung: Secondary | ICD-10-CM | POA: Diagnosis not present

## 2021-10-15 DIAGNOSIS — C7951 Secondary malignant neoplasm of bone: Secondary | ICD-10-CM | POA: Diagnosis not present

## 2021-10-18 DIAGNOSIS — J449 Chronic obstructive pulmonary disease, unspecified: Secondary | ICD-10-CM | POA: Diagnosis not present

## 2021-10-20 DIAGNOSIS — C3411 Malignant neoplasm of upper lobe, right bronchus or lung: Secondary | ICD-10-CM | POA: Diagnosis not present

## 2021-10-20 DIAGNOSIS — Z510 Encounter for antineoplastic radiation therapy: Secondary | ICD-10-CM | POA: Diagnosis not present

## 2021-10-20 DIAGNOSIS — C7949 Secondary malignant neoplasm of other parts of nervous system: Secondary | ICD-10-CM | POA: Diagnosis not present

## 2021-10-20 DIAGNOSIS — C3432 Malignant neoplasm of lower lobe, left bronchus or lung: Secondary | ICD-10-CM | POA: Diagnosis not present

## 2021-10-21 DIAGNOSIS — C7949 Secondary malignant neoplasm of other parts of nervous system: Secondary | ICD-10-CM | POA: Diagnosis not present

## 2021-10-21 DIAGNOSIS — Z510 Encounter for antineoplastic radiation therapy: Secondary | ICD-10-CM | POA: Diagnosis not present

## 2021-10-21 DIAGNOSIS — C3432 Malignant neoplasm of lower lobe, left bronchus or lung: Secondary | ICD-10-CM | POA: Diagnosis not present

## 2021-10-21 DIAGNOSIS — C3411 Malignant neoplasm of upper lobe, right bronchus or lung: Secondary | ICD-10-CM | POA: Diagnosis not present

## 2021-10-21 DIAGNOSIS — C7951 Secondary malignant neoplasm of bone: Secondary | ICD-10-CM | POA: Diagnosis not present

## 2021-10-30 DIAGNOSIS — J449 Chronic obstructive pulmonary disease, unspecified: Secondary | ICD-10-CM | POA: Diagnosis not present

## 2021-10-31 DIAGNOSIS — G4733 Obstructive sleep apnea (adult) (pediatric): Secondary | ICD-10-CM | POA: Diagnosis not present

## 2021-11-01 DIAGNOSIS — C3432 Malignant neoplasm of lower lobe, left bronchus or lung: Secondary | ICD-10-CM | POA: Diagnosis not present

## 2021-11-01 DIAGNOSIS — C3411 Malignant neoplasm of upper lobe, right bronchus or lung: Secondary | ICD-10-CM | POA: Diagnosis not present

## 2021-11-01 DIAGNOSIS — C7951 Secondary malignant neoplasm of bone: Secondary | ICD-10-CM | POA: Diagnosis not present

## 2021-11-01 DIAGNOSIS — Z510 Encounter for antineoplastic radiation therapy: Secondary | ICD-10-CM | POA: Diagnosis not present

## 2021-11-01 DIAGNOSIS — C7949 Secondary malignant neoplasm of other parts of nervous system: Secondary | ICD-10-CM | POA: Diagnosis not present

## 2021-11-03 DIAGNOSIS — C7949 Secondary malignant neoplasm of other parts of nervous system: Secondary | ICD-10-CM | POA: Diagnosis not present

## 2021-11-03 DIAGNOSIS — C3411 Malignant neoplasm of upper lobe, right bronchus or lung: Secondary | ICD-10-CM | POA: Diagnosis not present

## 2021-11-03 DIAGNOSIS — Z510 Encounter for antineoplastic radiation therapy: Secondary | ICD-10-CM | POA: Diagnosis not present

## 2021-11-03 DIAGNOSIS — C3432 Malignant neoplasm of lower lobe, left bronchus or lung: Secondary | ICD-10-CM | POA: Diagnosis not present

## 2021-11-03 DIAGNOSIS — C7951 Secondary malignant neoplasm of bone: Secondary | ICD-10-CM | POA: Diagnosis not present

## 2021-11-05 DIAGNOSIS — C7951 Secondary malignant neoplasm of bone: Secondary | ICD-10-CM | POA: Diagnosis not present

## 2021-11-05 DIAGNOSIS — Z510 Encounter for antineoplastic radiation therapy: Secondary | ICD-10-CM | POA: Diagnosis not present

## 2021-11-05 DIAGNOSIS — C3411 Malignant neoplasm of upper lobe, right bronchus or lung: Secondary | ICD-10-CM | POA: Diagnosis not present

## 2021-11-05 DIAGNOSIS — C7949 Secondary malignant neoplasm of other parts of nervous system: Secondary | ICD-10-CM | POA: Diagnosis not present

## 2021-11-05 DIAGNOSIS — C3432 Malignant neoplasm of lower lobe, left bronchus or lung: Secondary | ICD-10-CM | POA: Diagnosis not present

## 2021-11-08 DIAGNOSIS — C3432 Malignant neoplasm of lower lobe, left bronchus or lung: Secondary | ICD-10-CM | POA: Diagnosis not present

## 2021-11-08 DIAGNOSIS — Z510 Encounter for antineoplastic radiation therapy: Secondary | ICD-10-CM | POA: Diagnosis not present

## 2021-11-08 DIAGNOSIS — C7951 Secondary malignant neoplasm of bone: Secondary | ICD-10-CM | POA: Diagnosis not present

## 2021-11-08 DIAGNOSIS — C7949 Secondary malignant neoplasm of other parts of nervous system: Secondary | ICD-10-CM | POA: Diagnosis not present

## 2021-11-08 DIAGNOSIS — C3411 Malignant neoplasm of upper lobe, right bronchus or lung: Secondary | ICD-10-CM | POA: Diagnosis not present

## 2021-11-10 DIAGNOSIS — C3411 Malignant neoplasm of upper lobe, right bronchus or lung: Secondary | ICD-10-CM | POA: Diagnosis not present

## 2021-11-10 DIAGNOSIS — Z923 Personal history of irradiation: Secondary | ICD-10-CM | POA: Diagnosis not present

## 2021-11-10 DIAGNOSIS — C3432 Malignant neoplasm of lower lobe, left bronchus or lung: Secondary | ICD-10-CM | POA: Diagnosis not present

## 2021-11-10 DIAGNOSIS — Z510 Encounter for antineoplastic radiation therapy: Secondary | ICD-10-CM | POA: Diagnosis not present

## 2021-11-10 DIAGNOSIS — C7951 Secondary malignant neoplasm of bone: Secondary | ICD-10-CM | POA: Diagnosis not present

## 2021-11-18 ENCOUNTER — Encounter: Payer: Self-pay | Admitting: Internal Medicine

## 2021-11-18 ENCOUNTER — Ambulatory Visit (INDEPENDENT_AMBULATORY_CARE_PROVIDER_SITE_OTHER): Payer: Medicare Other | Admitting: Internal Medicine

## 2021-11-18 VITALS — BP 134/68 | HR 82 | Temp 97.6°F | Ht 68.0 in | Wt 236.0 lb

## 2021-11-18 DIAGNOSIS — J449 Chronic obstructive pulmonary disease, unspecified: Secondary | ICD-10-CM | POA: Diagnosis not present

## 2021-11-18 DIAGNOSIS — J9611 Chronic respiratory failure with hypoxia: Secondary | ICD-10-CM

## 2021-11-18 NOTE — Progress Notes (Signed)
Subjective:    Patient ID: Randall Sawyer, male    DOB: 1955/04/18     MRN: [id number]    Brief patient profile:  17  yowm quit smoking 05/2017/ MM   first noticed doe x around 2005 first on just albuterol then added spiriva then symbicort then stiolto which works the best so far and referred to pulmonary clinic 05/27/2014 by Dr Dennard Schaumann for copd eval and proved to have a GOLD IV severity  07/23/14 with hypercarbia.   History of Present Illness  05/27/2014 1st [facility] office visit/ Elidia Bonenfant   Chief Complaint  Patient presents with   Pulmonary Consult    referred by Dr. Dennard Schaumann SOB x 1 year. C/o of SOB, fluid retention, prod cough with yellow thick mucus.  Wheezing late in the evenings. Left sided abdominal pain.   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 3L O2 all of the time.    Dyspnea:  worse x 6 weeks / still walking at [employer]/ 1 aisle s stopping  Cough: none  Sleeping: flat bed, 2 pillows  SABA use: 1-2 per day 02: 3lpm doesn't titate  Covid status: vax x 2 and probably infected  Rec Prednisone 10 mg take  4 each am x 2 days,   2 each am x 2 days,  1 each am x 2 days and stop  Make sure you check your oxygen saturation  at your highest level of activity     Finished RT at [facility] for chondrosarcoma April 02 2021, ST only which improved s rx   04/13/2021  f/u ov/[place] office/Levern Kalka re: GOLD IV / 02 dep  maint on breztri   Cc "about the same with the breathing"  Dyspnea:  mb s stopping on 3lpm does not check sats  Cough: min mucoid  Sleeping:  flat bed with 3 pillows = baseline  SABA use: tiw  once or twice a month  02: 3lpm 24/7    Covid status: vax x 2 Rec Plan A = Automatic = Always=    Breztri Take 2 puffs first thing in am and then another 2 puffs about 12 hours later.   Plan B = Backup (to supplement plan A, not to replace it) Only use your albuterol inhaler as a rescue medication  Plan C = Crisis (instead of Plan B but only if Plan B stops working) - only use your albuterol nebulizer(not duoneb/ipatropium)  if you first try Plan B  Make sure you check your oxygen saturation  AT  your highest level of activity (not after you stop)   to be sure it stays over 90%    11/18/2021  f/u ov/[place] office/Maizey Menendez re: GOLD 4/02 dep  maint on breztri   Chief Complaint  Patient presents with   Follow-up    Breathing doing better since last ov. Using 3LO2 cont 24/7    Dyspnea:  mb and back s stopping on 3lpm  = 1/8 of mile uphill to mb and sats > 90%  Cough: none  Sleeping: sleep bed/ bipap and 3 pillows per [facility]  SABA use: couple times a day mostly with activity (not prechallenging)  02: 24/7  on bipap/3lpm and same daytime      No obvious day to day or daytime variability or assoc excess/ purulent sputum or  mucus plugs or hemoptysis or cp or chest tightness, subjective wheeze or overt sinus or hb symptoms.   Sleeping as above without nocturnal  or early am exacerbation  of respiratory  c/o's or need for noct saba. Also denies any obvious fluctuation of symptoms with weather or environmental changes or other aggravating or alleviating factors except as outlined above   No unusual exposure hx or h/o childhood pna/ asthma or knowledge of premature birth.  Current Allergies, Complete Past Medical History, Past Surgical History, Family History, and Social History were reviewed in [employer] record.  ROS  The following are not active complaints unless bolded Hoarseness, sore throat, dysphagia, dental problems, itching, sneezing,  nasal congestion or discharge of excess mucus or purulent secretions, ear ache,   fever, chills, sweats, unintended wt loss or wt gain, classically pleuritic or exertional cp,  orthopnea pnd or arm/hand swelling  or leg swelling, presyncope, palpitations, abdominal pain, anorexia, nausea, vomiting, diarrhea  or change in bowel habits or change in bladder habits, change in stools or change in urine, dysuria, hematuria,  rash, arthralgias, visual complaints, headache, numbness, weakness or ataxia or problems with walking or coordination,  change in mood or  memory.        Current Meds  Medication Sig   acetaminophen (TYLENOL) 325 MG tablet Take 2 tablets (650 mg total) by mouth every 6 (six) hours as needed for mild pain (or Fever >/= 101).   albuterol (PROVENTIL) (2.5 MG/3ML) 0.083% nebulizer solution Take 3 mLs (2.5 mg total) by nebulization every 4 (four) hours as needed for wheezing or shortness of breath.   albuterol (VENTOLIN HFA) 108 (90 Base) MCG/ACT inhaler Inhale 2 puffs into the lungs every 6 (six) hours as needed for wheezing or shortness of breath.   atorvastatin (LIPITOR) 20 MG tablet Take 1 tablet (20 mg total) by mouth daily.    Budeson-Glycopyrrol-Formoterol (BREZTRI AEROSPHERE) 160-9-4.8 MCG/ACT AERO Inhale 2 puffs into the lungs in the morning and at bedtime.  dexamethasone (DECADRON) 2 MG tablet Take by mouth.   furosemide (LASIX) 40 MG tablet TAKE 1 TABLET(40 MG) BY MOUTH DAILY   OXYGEN Inhale 3 L into the lungs daily. And 3 lpm with exertion   pantoprazole (PROTONIX) 40 MG tablet TAKE 1 TABLET(40 MG) BY MOUTH IN THE MORNING AND 30 MINUTES BEFORE BREAKFAST   spironolactone (ALDACTONE) 25 MG tablet Take 1 tablet (25 mg total) by mouth daily.   triamcinolone cream (KENALOG) 0.1 %                           Objective:   Physical Exam  11/18/2021       236 04/13/2021         244  09/28/2020       228  12/11/2019       234 07/03/2019       227  12/03/2018     231  06/11/2014         179 >  07/23/2014   184 > 09/03/2014 185 > 12/01/2014  196 > 03/03/2015  190 > 08/31/2015     192 > 09/28/2015  195  > 11/09/2015   186  > 02/09/2016     189> 11/15/2016  198 >  03/02/2017  204 > 07/04/2017  201> 10/04/2017 213  > 05/28/2018   225     05/27/14 180 lb (81.647 kg)  05/16/14 176 lb (79.833 kg)  05/09/14 180 lb (81.647 kg)      Vital signs reviewed  11/18/2021  - Note at rest 02 sats  96% on 3lpm    General appearance:    obese pleasant amb wm nad     HEENT :  Oropharynx  clear  Nasal turbinates nl    NECK :  without JVD/Nodes/TM/ nl carotid upstrokes bilaterally   LUNGS: no acc muscle use,  Mod barrel  contour chest wall with bilateral  Distant bs s audible wheeze and  without cough on insp or exp maneuvers and mod  Hyperresonant  to  percussion bilaterally     CV:  RRR  no s3 or murmur or increase in P2, and 2+ pitting edema both LEs despite elastic hose    ABD:  soft and nontender with pos mid insp Hoover's  in the supine position. No bruits or organomegaly appreciated, bowel sounds nl  MS:   Ext warm without deformities or   obvious joint restrictions , calf tenderness, cyanosis or clubbing  SKIN: warm and dry  without lesions    NEURO:  alert, approp, nl sensorium with  no motor or cerebellar deficits apparent.

## 2021-11-18 NOTE — Patient Instructions (Signed)
No change in medications   Also  Ok to try albuterol 15 min before an activity (on alternating days)  that you know would usually make you short of breath and see if it makes any difference and if makes none then don't take albuterol after activity unless you can't catch your breath as this means it's the resting that helps, not the albuterol.     Please schedule a follow up visit in 12 months but call sooner if needed

## 2021-11-19 ENCOUNTER — Encounter: Payer: Self-pay | Admitting: Internal Medicine

## 2021-11-19 NOTE — Assessment & Plan Note (Signed)
HC03  36  05/16/14 corresponding to a pc02 above 55  - ono RA < 89% x 4 h 11 min > 09/12/2014  rec one liter per min hs and recheck = > done 9/141/6 with < 89% x 27m > no change rx  - HC03  11/09/2015  = 39  - HCO3 02/09/2016    = 40  - HC03  11/15/2016    = 36  - 07/04/2017  Walked RA x 3 laps @ 185 ft each stopped due to  desat to 85% then completed 3rd lap nl pace on 3lpm POC pulsed  - 12/03/2018   Walked 2lpmx two laps =  approx 520ft @ moderate pace - stopped due to end of study with sats of 91% at the end of the study but sob so rec 3lpm at highest level of activity with goal > 90% and help burn fat to get into neg cal balance  -  12/11/2019   Walked RA  approx   200 ft  @ avg pace  stopped due to  Sob/tired with sats still 92% but on RA only 87% at rest vs 90% on 0.5 lpm   Sats ok on longest walk of the day on 3lpm > no changes needed  F/u one year, sooner prn

## 2021-11-19 NOTE — Assessment & Plan Note (Addendum)
Quit smoking 05/2017 /MM - HC03 36 05/16/14 - 05/27/2014 p extensive coaching HFA effectiveness =    90% > try symbicort/ incruse  - 06/11/14 Spriometry FEV1  0.57 (15%) ratio 34 1 h p last saba  - PFTs 07/23/2014   FEV1  0.84 (25%) ratio 44 p 16% improvement and dlco 65 p am symbicort  -  09/03/2014  Walked RA x 3 laps @ 185 ft each stopped due to  End of study, nl pace, no sob or desat   - 03/03/2015   try BEVESPI   - d/c cigs 05/21/17 at resection of R rib chondrosarcoma - 05/28/2018   continue bevespi 2 bid   - 09/28/2020  After extensive coaching inhaler device,  effectiveness =    75% (short Ti)   -  09/28/2020  :   alpha one AT phenotype  MM  Level 170  -  04/13/2021  After extensive coaching inhaler device,  effectiveness =   80% (still short Ti)   Group D (now reclassified as E) in terms of symptom/risk and laba/lama/ICS  therefore appropriate rx at this point >>>  breztri and approp saba   Reminded: Re SABA :  I spent extra time with pt today reviewing appropriate use of albuterol for prn use on exertion with the following points: 1) saba is for relief of sob that does not improve by walking a slower pace or resting but rather if the pt does not improve after trying this first. 2) If the pt is convinced, as many are, that saba helps recover from activity faster then it's easy to tell if this is the case by re-challenging : ie stop, take the inhaler, then p 5 minutes try the exact same activity (intensity of workload) that just caused the symptoms and see if they are substantially diminished or not after saba 3) if there is an activity that reproducibly causes the symptoms, try the saba 15 min before the activity on alternate days   If in fact the saba really does help, then fine to continue to use it prn but advised may need to look closer at the maintenance regimen being used to achieve better control of airways disease with exertion.

## 2021-11-21 ENCOUNTER — Encounter (INDEPENDENT_AMBULATORY_CARE_PROVIDER_SITE_OTHER): Payer: Self-pay | Admitting: Gastroenterology

## 2021-11-30 DIAGNOSIS — J449 Chronic obstructive pulmonary disease, unspecified: Secondary | ICD-10-CM | POA: Diagnosis not present

## 2021-12-01 DIAGNOSIS — G4733 Obstructive sleep apnea (adult) (pediatric): Secondary | ICD-10-CM | POA: Diagnosis not present

## 2021-12-06 DIAGNOSIS — C413 Malignant neoplasm of ribs, sternum and clavicle: Secondary | ICD-10-CM | POA: Diagnosis not present

## 2021-12-06 DIAGNOSIS — C3431 Malignant neoplasm of lower lobe, right bronchus or lung: Secondary | ICD-10-CM | POA: Diagnosis not present

## 2021-12-06 DIAGNOSIS — C3492 Malignant neoplasm of unspecified part of left bronchus or lung: Secondary | ICD-10-CM | POA: Diagnosis not present

## 2021-12-06 DIAGNOSIS — Z87891 Personal history of nicotine dependence: Secondary | ICD-10-CM | POA: Diagnosis not present

## 2021-12-06 DIAGNOSIS — R918 Other nonspecific abnormal finding of lung field: Secondary | ICD-10-CM | POA: Diagnosis not present

## 2021-12-06 DIAGNOSIS — C3411 Malignant neoplasm of upper lobe, right bronchus or lung: Secondary | ICD-10-CM | POA: Diagnosis not present

## 2021-12-06 DIAGNOSIS — C3432 Malignant neoplasm of lower lobe, left bronchus or lung: Secondary | ICD-10-CM | POA: Diagnosis not present

## 2021-12-06 DIAGNOSIS — J449 Chronic obstructive pulmonary disease, unspecified: Secondary | ICD-10-CM | POA: Diagnosis not present

## 2021-12-10 DIAGNOSIS — C3411 Malignant neoplasm of upper lobe, right bronchus or lung: Secondary | ICD-10-CM | POA: Diagnosis not present

## 2021-12-10 DIAGNOSIS — C419 Malignant neoplasm of bone and articular cartilage, unspecified: Secondary | ICD-10-CM | POA: Diagnosis not present

## 2021-12-10 DIAGNOSIS — Z510 Encounter for antineoplastic radiation therapy: Secondary | ICD-10-CM | POA: Diagnosis not present

## 2021-12-10 DIAGNOSIS — C3432 Malignant neoplasm of lower lobe, left bronchus or lung: Secondary | ICD-10-CM | POA: Diagnosis not present

## 2021-12-10 DIAGNOSIS — C7949 Secondary malignant neoplasm of other parts of nervous system: Secondary | ICD-10-CM | POA: Diagnosis not present

## 2021-12-10 DIAGNOSIS — C3431 Malignant neoplasm of lower lobe, right bronchus or lung: Secondary | ICD-10-CM | POA: Diagnosis not present

## 2021-12-10 DIAGNOSIS — Z923 Personal history of irradiation: Secondary | ICD-10-CM | POA: Diagnosis not present

## 2021-12-14 DIAGNOSIS — C3411 Malignant neoplasm of upper lobe, right bronchus or lung: Secondary | ICD-10-CM | POA: Diagnosis not present

## 2021-12-14 DIAGNOSIS — C3432 Malignant neoplasm of lower lobe, left bronchus or lung: Secondary | ICD-10-CM | POA: Diagnosis not present

## 2021-12-14 DIAGNOSIS — Z510 Encounter for antineoplastic radiation therapy: Secondary | ICD-10-CM | POA: Diagnosis not present

## 2021-12-14 DIAGNOSIS — C7949 Secondary malignant neoplasm of other parts of nervous system: Secondary | ICD-10-CM | POA: Diagnosis not present

## 2021-12-18 DIAGNOSIS — J449 Chronic obstructive pulmonary disease, unspecified: Secondary | ICD-10-CM | POA: Diagnosis not present

## 2021-12-22 DIAGNOSIS — C3432 Malignant neoplasm of lower lobe, left bronchus or lung: Secondary | ICD-10-CM | POA: Diagnosis not present

## 2021-12-22 DIAGNOSIS — C7949 Secondary malignant neoplasm of other parts of nervous system: Secondary | ICD-10-CM | POA: Diagnosis not present

## 2021-12-22 DIAGNOSIS — C3431 Malignant neoplasm of lower lobe, right bronchus or lung: Secondary | ICD-10-CM | POA: Diagnosis not present

## 2021-12-22 DIAGNOSIS — Z510 Encounter for antineoplastic radiation therapy: Secondary | ICD-10-CM | POA: Diagnosis not present

## 2021-12-22 DIAGNOSIS — C3411 Malignant neoplasm of upper lobe, right bronchus or lung: Secondary | ICD-10-CM | POA: Diagnosis not present

## 2021-12-22 DIAGNOSIS — Z923 Personal history of irradiation: Secondary | ICD-10-CM | POA: Diagnosis not present

## 2021-12-22 DIAGNOSIS — C419 Malignant neoplasm of bone and articular cartilage, unspecified: Secondary | ICD-10-CM | POA: Diagnosis not present

## 2021-12-30 DIAGNOSIS — J449 Chronic obstructive pulmonary disease, unspecified: Secondary | ICD-10-CM | POA: Diagnosis not present

## 2021-12-31 DIAGNOSIS — C3411 Malignant neoplasm of upper lobe, right bronchus or lung: Secondary | ICD-10-CM | POA: Diagnosis not present

## 2021-12-31 DIAGNOSIS — C7949 Secondary malignant neoplasm of other parts of nervous system: Secondary | ICD-10-CM | POA: Diagnosis not present

## 2021-12-31 DIAGNOSIS — C3431 Malignant neoplasm of lower lobe, right bronchus or lung: Secondary | ICD-10-CM | POA: Diagnosis not present

## 2021-12-31 DIAGNOSIS — C419 Malignant neoplasm of bone and articular cartilage, unspecified: Secondary | ICD-10-CM | POA: Diagnosis not present

## 2021-12-31 DIAGNOSIS — G4733 Obstructive sleep apnea (adult) (pediatric): Secondary | ICD-10-CM | POA: Diagnosis not present

## 2021-12-31 DIAGNOSIS — Z510 Encounter for antineoplastic radiation therapy: Secondary | ICD-10-CM | POA: Diagnosis not present

## 2021-12-31 DIAGNOSIS — C3432 Malignant neoplasm of lower lobe, left bronchus or lung: Secondary | ICD-10-CM | POA: Diagnosis not present

## 2021-12-31 DIAGNOSIS — Z923 Personal history of irradiation: Secondary | ICD-10-CM | POA: Diagnosis not present

## 2022-01-04 ENCOUNTER — Encounter (HOSPITAL_COMMUNITY): Payer: Self-pay | Admitting: *Deleted

## 2022-01-04 ENCOUNTER — Emergency Department (HOSPITAL_COMMUNITY): Payer: Medicare Other

## 2022-01-04 ENCOUNTER — Inpatient Hospital Stay (HOSPITAL_COMMUNITY)
Admission: EM | Admit: 2022-01-04 | Discharge: 2022-01-06 | DRG: 190 | Disposition: A | Discharge status: Home / Self Care | Payer: Medicare Other | Attending: Internal Medicine | Admitting: Internal Medicine

## 2022-01-04 DIAGNOSIS — Z85830 Personal history of malignant neoplasm of bone: Secondary | ICD-10-CM

## 2022-01-04 DIAGNOSIS — I5032 Chronic diastolic (congestive) heart failure: Secondary | ICD-10-CM | POA: Diagnosis present

## 2022-01-04 DIAGNOSIS — C3432 Malignant neoplasm of lower lobe, left bronchus or lung: Secondary | ICD-10-CM | POA: Diagnosis present

## 2022-01-04 DIAGNOSIS — C3492 Malignant neoplasm of unspecified part of left bronchus or lung: Secondary | ICD-10-CM | POA: Diagnosis present

## 2022-01-04 DIAGNOSIS — I10 Essential (primary) hypertension: Secondary | ICD-10-CM | POA: Diagnosis present

## 2022-01-04 DIAGNOSIS — J9621 Acute and chronic respiratory failure with hypoxia: Secondary | ICD-10-CM | POA: Diagnosis not present

## 2022-01-04 DIAGNOSIS — Z79899 Other long term (current) drug therapy: Secondary | ICD-10-CM | POA: Diagnosis not present

## 2022-01-04 DIAGNOSIS — Z7951 Long term (current) use of inhaled steroids: Secondary | ICD-10-CM

## 2022-01-04 DIAGNOSIS — Z8249 Family history of ischemic heart disease and other diseases of the circulatory system: Secondary | ICD-10-CM | POA: Diagnosis not present

## 2022-01-04 DIAGNOSIS — Z6836 Body mass index (BMI) 36.0-36.9, adult: Secondary | ICD-10-CM | POA: Diagnosis not present

## 2022-01-04 DIAGNOSIS — E669 Obesity, unspecified: Secondary | ICD-10-CM | POA: Diagnosis present

## 2022-01-04 DIAGNOSIS — R0602 Shortness of breath: Secondary | ICD-10-CM | POA: Diagnosis not present

## 2022-01-04 DIAGNOSIS — R600 Localized edema: Secondary | ICD-10-CM | POA: Diagnosis present

## 2022-01-04 DIAGNOSIS — E876 Hypokalemia: Secondary | ICD-10-CM | POA: Diagnosis not present

## 2022-01-04 DIAGNOSIS — J441 Chronic obstructive pulmonary disease with (acute) exacerbation: Principal | ICD-10-CM | POA: Diagnosis present

## 2022-01-04 DIAGNOSIS — Z1152 Encounter for screening for COVID-19: Secondary | ICD-10-CM

## 2022-01-04 DIAGNOSIS — I110 Hypertensive heart disease with heart failure: Secondary | ICD-10-CM | POA: Diagnosis not present

## 2022-01-04 DIAGNOSIS — R112 Nausea with vomiting, unspecified: Secondary | ICD-10-CM | POA: Diagnosis not present

## 2022-01-04 DIAGNOSIS — J449 Chronic obstructive pulmonary disease, unspecified: Secondary | ICD-10-CM | POA: Diagnosis not present

## 2022-01-04 DIAGNOSIS — Z9981 Dependence on supplemental oxygen: Secondary | ICD-10-CM | POA: Diagnosis not present

## 2022-01-04 DIAGNOSIS — Z87891 Personal history of nicotine dependence: Secondary | ICD-10-CM | POA: Diagnosis not present

## 2022-01-04 LAB — CBC WITH DIFFERENTIAL/PLATELET
Abs Immature Granulocytes: 0.11 10*3/uL — ABNORMAL HIGH (ref 0.00–0.07)
Basophils Absolute: 0 10*3/uL (ref 0.0–0.1)
Basophils Relative: 1 %
Eosinophils Absolute: 0.1 10*3/uL (ref 0.0–0.5)
Eosinophils Relative: 1 %
HCT: 33.7 % — ABNORMAL LOW (ref 39.0–52.0)
Hemoglobin: 10.2 g/dL — ABNORMAL LOW (ref 13.0–17.0)
Immature Granulocytes: 1 %
Lymphocytes Relative: 9 %
Lymphs Abs: 0.7 10*3/uL (ref 0.7–4.0)
MCH: 28.2 pg (ref 26.0–34.0)
MCHC: 30.3 g/dL (ref 30.0–36.0)
MCV: 93.1 fL (ref 80.0–100.0)
Monocytes Absolute: 0.6 10*3/uL (ref 0.1–1.0)
Monocytes Relative: 7 %
Neutro Abs: 6.8 10*3/uL (ref 1.7–7.7)
Neutrophils Relative %: 81 %
Platelets: 345 10*3/uL (ref 150–400)
RBC: 3.62 MIL/uL — ABNORMAL LOW (ref 4.22–5.81)
RDW: 17.9 % — ABNORMAL HIGH (ref 11.5–15.5)
WBC: 8.4 10*3/uL (ref 4.0–10.5)
nRBC: 0 % (ref 0.0–0.2)

## 2022-01-04 LAB — RESP PANEL BY RT-PCR (RSV, FLU A&B, COVID)  RVPGX2
Influenza A by PCR: NEGATIVE
Influenza B by PCR: NEGATIVE
Resp Syncytial Virus by PCR: NEGATIVE
SARS Coronavirus 2 by RT PCR: NEGATIVE

## 2022-01-04 LAB — COMPREHENSIVE METABOLIC PANEL
ALT: 21 U/L (ref 0–44)
AST: 39 U/L (ref 15–41)
Albumin: 3.5 g/dL (ref 3.5–5.0)
Alkaline Phosphatase: 119 U/L (ref 38–126)
Anion gap: 15 (ref 5–15)
BUN: 15 mg/dL (ref 8–23)
CO2: 37 mmol/L — ABNORMAL HIGH (ref 22–32)
Calcium: 8.5 mg/dL — ABNORMAL LOW (ref 8.9–10.3)
Chloride: 86 mmol/L — ABNORMAL LOW (ref 98–111)
Creatinine, Ser: 1.38 mg/dL — ABNORMAL HIGH (ref 0.61–1.24)
GFR, Estimated: 56 mL/min — ABNORMAL LOW (ref >60)
Glucose, Bld: 115 mg/dL — ABNORMAL HIGH (ref 70–99)
Potassium: 3.4 mmol/L — ABNORMAL LOW (ref 3.5–5.1)
Sodium: 138 mmol/L (ref 135–145)
Total Bilirubin: 1.1 mg/dL (ref 0.3–1.2)
Total Protein: 8 g/dL (ref 6.5–8.1)

## 2022-01-04 LAB — TROPONIN I (HIGH SENSITIVITY): Troponin I (High Sensitivity): 6 ng/L (ref <18)

## 2022-01-04 LAB — BRAIN NATRIURETIC PEPTIDE: B Natriuretic Peptide: 84 pg/mL (ref 0.0–100.0)

## 2022-01-04 LAB — MAGNESIUM: Magnesium: 2 mg/dL (ref 1.7–2.4)

## 2022-01-04 MED ORDER — SODIUM CHLORIDE 0.9 % IV SOLN
500.0000 mg | INTRAVENOUS | Status: AC
  Administered 2022-01-05: 500 mg via INTRAVENOUS
  Filled 2022-01-04: qty 5

## 2022-01-04 MED ORDER — FUROSEMIDE 40 MG PO TABS
40.0000 mg | ORAL_TABLET | Freq: Every day | ORAL | Status: DC
  Administered 2022-01-05 – 2022-01-06 (×2): 40 mg via ORAL
  Filled 2022-01-04 (×2): qty 1

## 2022-01-04 MED ORDER — IPRATROPIUM-ALBUTEROL 0.5-2.5 (3) MG/3ML IN SOLN
3.0000 mL | RESPIRATORY_TRACT | Status: DC | PRN
  Administered 2022-01-05: 3 mL via RESPIRATORY_TRACT
  Filled 2022-01-04: qty 3

## 2022-01-04 MED ORDER — GUAIFENESIN-DM 100-10 MG/5ML PO SYRP: 10.0000 mL | ORAL_SOLUTION | ORAL | Status: DC | PRN

## 2022-01-04 MED ORDER — POLYETHYLENE GLYCOL 3350 17 G PO PACK: 17.0000 g | PACK | Freq: Every day | ORAL | Status: DC | PRN

## 2022-01-04 MED ORDER — METHYLPREDNISOLONE SODIUM SUCC 125 MG IJ SOLR
60.0000 mg | Freq: Two times a day (BID) | INTRAMUSCULAR | Status: AC
  Administered 2022-01-05 (×2): 60 mg via INTRAVENOUS
  Filled 2022-01-04 (×2): qty 2

## 2022-01-04 MED ORDER — FUROSEMIDE 10 MG/ML IJ SOLN
40.0000 mg | Freq: Once | INTRAMUSCULAR | Status: AC
  Administered 2022-01-04: 40 mg via INTRAVENOUS
  Filled 2022-01-04: qty 4

## 2022-01-04 MED ORDER — ENOXAPARIN SODIUM 40 MG/0.4ML IJ SOSY
40.0000 mg | PREFILLED_SYRINGE | INTRAMUSCULAR | Status: DC
  Administered 2022-01-05 – 2022-01-06 (×2): 40 mg via SUBCUTANEOUS
  Filled 2022-01-04 (×2): qty 0.4

## 2022-01-04 MED ORDER — PANTOPRAZOLE SODIUM 40 MG PO TBEC
40.0000 mg | DELAYED_RELEASE_TABLET | Freq: Every day | ORAL | Status: DC
  Administered 2022-01-05 – 2022-01-06 (×2): 40 mg via ORAL
  Filled 2022-01-04 (×2): qty 1

## 2022-01-04 MED ORDER — AZITHROMYCIN 250 MG PO TABS
500.0000 mg | ORAL_TABLET | Freq: Every day | ORAL | Status: DC
  Administered 2022-01-06: 500 mg via ORAL
  Filled 2022-01-04: qty 2

## 2022-01-04 MED ORDER — POTASSIUM CHLORIDE CRYS ER 20 MEQ PO TBCR
20.0000 meq | EXTENDED_RELEASE_TABLET | Freq: Once | ORAL | Status: AC
  Administered 2022-01-04: 20 meq via ORAL
  Filled 2022-01-04: qty 1

## 2022-01-04 MED ORDER — ALBUTEROL SULFATE (2.5 MG/3ML) 0.083% IN NEBU
2.5000 mg | INHALATION_SOLUTION | Freq: Once | RESPIRATORY_TRACT | Status: AC
  Administered 2022-01-04: 2.5 mg via RESPIRATORY_TRACT
  Filled 2022-01-04: qty 3

## 2022-01-04 MED ORDER — IPRATROPIUM-ALBUTEROL 0.5-2.5 (3) MG/3ML IN SOLN
3.0000 mL | Freq: Once | RESPIRATORY_TRACT | Status: AC
  Administered 2022-01-04: 3 mL via RESPIRATORY_TRACT
  Filled 2022-01-04: qty 3

## 2022-01-04 MED ORDER — SPIRONOLACTONE 25 MG PO TABS
25.0000 mg | ORAL_TABLET | Freq: Every day | ORAL | Status: DC
  Administered 2022-01-05 – 2022-01-06 (×2): 25 mg via ORAL
  Filled 2022-01-04 (×2): qty 1

## 2022-01-04 MED ORDER — ACETAMINOPHEN 325 MG PO TABS
650.0000 mg | ORAL_TABLET | Freq: Four times a day (QID) | ORAL | Status: DC | PRN
  Administered 2022-01-05 – 2022-01-06 (×3): 650 mg via ORAL
  Filled 2022-01-04 (×3): qty 2

## 2022-01-04 MED ORDER — ATORVASTATIN CALCIUM 20 MG PO TABS
20.0000 mg | ORAL_TABLET | Freq: Every day | ORAL | Status: DC
  Administered 2022-01-05 – 2022-01-06 (×2): 20 mg via ORAL
  Filled 2022-01-04 (×2): qty 1

## 2022-01-04 MED ORDER — MAGNESIUM SULFATE 2 GM/50ML IV SOLN
2.0000 g | Freq: Once | INTRAVENOUS | Status: AC
  Administered 2022-01-04: 2 g via INTRAVENOUS
  Filled 2022-01-04: qty 50

## 2022-01-04 MED ORDER — METHYLPREDNISOLONE SODIUM SUCC 125 MG IJ SOLR
125.0000 mg | Freq: Once | INTRAMUSCULAR | Status: AC
  Administered 2022-01-04: 125 mg via INTRAVENOUS
  Filled 2022-01-04: qty 2

## 2022-01-04 MED ORDER — GABAPENTIN 300 MG PO CAPS
600.0000 mg | ORAL_CAPSULE | Freq: Three times a day (TID) | ORAL | Status: DC
  Administered 2022-01-05: 600 mg via ORAL
  Filled 2022-01-04 (×4): qty 2

## 2022-01-04 MED ORDER — ACETAMINOPHEN 650 MG RE SUPP: 650.0000 mg | Freq: Four times a day (QID) | RECTAL | Status: DC | PRN

## 2022-01-04 MED ORDER — ACETAMINOPHEN 500 MG PO TABS
1000.0000 mg | ORAL_TABLET | Freq: Once | ORAL | Status: AC
  Administered 2022-01-04: 1000 mg via ORAL
  Filled 2022-01-04: qty 2

## 2022-01-04 MED ORDER — IPRATROPIUM-ALBUTEROL 0.5-2.5 (3) MG/3ML IN SOLN
3.0000 mL | Freq: Three times a day (TID) | RESPIRATORY_TRACT | Status: AC
  Administered 2022-01-05 (×3): 3 mL via RESPIRATORY_TRACT
  Filled 2022-01-04 (×3): qty 3

## 2022-01-04 MED ORDER — ONDANSETRON HCL 4 MG PO TABS: 4.0000 mg | ORAL_TABLET | Freq: Four times a day (QID) | ORAL | Status: DC | PRN

## 2022-01-04 MED ORDER — ONDANSETRON HCL 4 MG/2ML IJ SOLN: 4.0000 mg | Freq: Four times a day (QID) | INTRAMUSCULAR | Status: DC | PRN

## 2022-01-04 MED ORDER — POTASSIUM CHLORIDE CRYS ER 20 MEQ PO TBCR
40.0000 meq | EXTENDED_RELEASE_TABLET | Freq: Once | ORAL | Status: AC
  Administered 2022-01-05: 40 meq via ORAL
  Filled 2022-01-04: qty 2

## 2022-01-04 MED ORDER — PREDNISONE 20 MG PO TABS
40.0000 mg | ORAL_TABLET | Freq: Every day | ORAL | Status: DC
  Administered 2022-01-06: 40 mg via ORAL
  Filled 2022-01-04: qty 2

## 2022-01-04 NOTE — Assessment & Plan Note (Signed)
Chronic bilateral lower extremity swelling.  Last echo 12/2019, EF 60 to 65% normal LV parameters.  BNP 84.  Per chart weight appears stable over the past year. -IV Lasix 40 mg x 1, continue home 40 mg daily

## 2022-01-04 NOTE — ED Triage Notes (Signed)
Pt in c/o SOB with n/v/d onset x 2 days, pt uses 3 L [place] at baseline, pt c/o fever & chills, pt reports x 2 loose stools in the last 24 hours, pt speaks in short sentences, A&O x4

## 2022-01-04 NOTE — ED Provider Notes (Signed)
Pt signed out by Dr. Oswald Hillock pending symptomatic improvement.  Pt has received several nebs and steroids.  He still feels sob and light-headed.  Pt's nurse got him up to try and ambulate and O2 sats dropped to 87-88% just sitting up in bed on 4L (he is normally on 3).  He was too sob to ambulate.  Pt d/w Dr. Denton Brick (triad) for admission.   Isla Pence, MD 01/04/22 1924

## 2022-01-04 NOTE — Assessment & Plan Note (Signed)
Follows with providers at [place].   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 -Currently undergoing chemotherapy

## 2022-01-04 NOTE — H&P (Signed)
History and Physical    RICHMOND COLDREN IRJ:[id number] DOB: Mar 12, 1955 DOA: 01/04/2022  PCP: Susy Frizzle, MD  Patient coming from: Home  I have personally briefly reviewed patient's old medical records in [place]  Chief Complaint: Difficulty breathing  HPI: Randall Sawyer is a 66 y.o. male with medical history significant for COPD with chronic respiratory failure on 3 L, hypertension, squamous cell cancer of lung. Patient presented to the ED with complaints of difficulty breathing of 3 days duration.  Reports wheezing.  Fever and chills, with 2 episodes of loose stools and 3 episodes of vomiting yesterday, none today.  He has chronic cough that is unchanged. Reports chronic bilateral lower extremity swelling that is worse than baseline.  No chest pain.  ED Course: Tmax 98.2.  Heart rate 98-111.  Respirate rate 17-23.  Blood pressure systolic 301-601.  O2 sats on home 3 L down to 89% on my evaluation. Chest x-ray negative for acute abnormality.  COVID influenza and RSV negative. BNP 84 Troponin 6. IV Solu-Medrol 125 x 1 given.  DuoNebs given. Hospitalist to admit for acute hypoxic respiratory failure secondary to COPD exacerbation.  Review of Systems: As per HPI all other systems reviewed and negative.  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, 4.5 cm, grade 2   COPD (chronic obstructive pulmonary disease) (HCC)    Hypertension    Nephrolithiasis    Tobacco abuse     Past Surgical History:  Procedure Laterality Date   BIOPSY N/A 02/26/2015   Procedure: BIOPSY;  Surgeon: Rogene Houston, MD;  Location: [facility];  Service: Endoscopy;  Laterality: N/A;  Gastric biopsies   ESOPHAGOGASTRODUODENOSCOPY N/A 11/19/2014   Procedure: ESOPHAGOGASTRODUODENOSCOPY (EGD);  Surgeon: Rogene Houston, MD;  Location: [facility];  Service: Endoscopy;  Laterality: N/A;  2:15-rescheduled to 11/9 @1 :61 Ann notified pt    ESOPHAGOGASTRODUODENOSCOPY N/A 02/26/2015   Procedure: ESOPHAGOGASTRODUODENOSCOPY (EGD);  Surgeon: Rogene Houston, MD;  Location: [facility];  Service: Endoscopy;  Laterality: N/A;  1225     reports that he quit smoking about 4 years ago. His smoking use included cigarettes. He started smoking about 57 years ago. He has a 12.75 pack-year smoking history. He has never used smokeless tobacco. He reports that he does not drink alcohol and does not use drugs.  No Known Allergies  Family History  Problem Relation Age of Onset   COPD Father    Heart disease Father    Asthma Father    Diabetes type II Mother    Diabetes Sister     Prior to Admission medications   Medication Sig Start Date End Date Taking? Authorizing Provider  acetaminophen (TYLENOL) 325 MG tablet Take 2 tablets (650 mg total) by mouth every 6 (six) hours as needed for mild pain (or Fever >/= 101). 01/02/20  Yes Horace Wishon, [place], MD  albuterol (PROVENTIL) (2.5 MG/3ML) 0.083% nebulizer solution Take 3 mLs (2.5 mg total) by nebulization every 4 (four) hours as needed for wheezing or shortness of breath. 04/13/21  Yes Tanda Rockers, MD  albuterol (VENTOLIN HFA) 108 (90 Base) MCG/ACT inhaler Inhale 2 puffs into the lungs every 6 (six) hours as needed for wheezing or shortness of breath. 07/30/21  Yes Tanda Rockers, MD  atorvastatin (LIPITOR) 20 MG tablet Take 1 tablet (20 mg total) by mouth daily. 09/20/21  Yes Rubie Maid, FNP  furosemide (LASIX) 40 MG tablet TAKE 1 TABLET(40 MG) BY MOUTH DAILY 10/05/21  Yes Susy Frizzle, MD  gabapentin (NEURONTIN) 300 MG capsule Take 600 mg by mouth 3 (three) times daily. 12/20/21  Yes [provider]  OXYGEN Inhale 3 L into the lungs daily. And 3 lpm with exertion   Yes [provider]  pantoprazole (PROTONIX) 40 MG tablet TAKE 1 TABLET(40 MG) BY MOUTH IN THE MORNING AND 30 MINUTES BEFORE BREAKFAST 08/30/21  Yes Susy Frizzle, MD  spironolactone (ALDACTONE) 25 MG  tablet Take 1 tablet (25 mg total) by mouth daily. 01/03/20  Yes Anayansi Rundquist, Courage, MD  Budeson-Glycopyrrol-Formoterol (BREZTRI AEROSPHERE) 160-9-4.8 MCG/ACT AERO Inhale 2 puffs into the lungs in the morning and at bedtime. Patient not taking: Reported on 01/04/2022 03/11/21   Tanda Rockers, MD  dexamethasone (DECADRON) 2 MG tablet Take by mouth. Patient not taking: Reported on 01/04/2022    [provider]    Physical Exam: Vitals:   01/04/22 1744 01/04/22 1748 01/04/22 1800 01/04/22 1834  BP:   130/73   Pulse: (!) 107 (!) 111 (!) 109   Resp: (!) 21 19 18    Temp:      TempSrc:      SpO2: (!) 88% 90% 90% 92%    Constitutional: NAD, calm, comfortable Vitals:   01/04/22 1744 01/04/22 1748 01/04/22 1800 01/04/22 1834  BP:   130/73   Pulse: (!) 107 (!) 111 (!) 109   Resp: (!) 21 19 18    Temp:      TempSrc:      SpO2: (!) 88% 90% 90% 92%   Eyes: PERRL, lids and conjunctivae normal ENMT: Mucous membranes are moist.  Neck: normal, supple, no masses, no thyromegaly Respiratory: Marked reduced air entry bilaterally, chest sounds tight, no crackles.  Sitting upright in bed, did not report lips, mild increased work of breathing, no accessory muscle use.  Cardiovascular: Regular rate and rhythm, no murmurs / rubs / gallops.  2+ pitting bilateral lower extremity edema to upper legs.  Extremities warm. Abdomen: no tenderness, no masses palpated. No hepatosplenomegaly. Bowel sounds positive.  Musculoskeletal: no clubbing / cyanosis. No joint deformity upper and lower extremities.  Skin: Large area of dry scaly psoriatic appearing rash on back, no ulcers. No induration Neurologic: No apparent cranial nerve abnormality, moving extremities spontaneously. Psychiatric: Normal judgment and insight. Alert and oriented x 3. Normal mood.   Labs on Admission: I have personally reviewed following labs and imaging studies  CBC: Recent Labs  Lab 01/04/22 1149  WBC 8.4  NEUTROABS 6.8  HGB  10.2*  HCT 33.7*  MCV 93.1  PLT 315   Basic Metabolic Panel: Recent Labs  Lab 01/04/22 1149  NA 138  K 3.4*  CL 86*  CO2 37*  GLUCOSE 115*  BUN 15  CREATININE 1.38*  CALCIUM 8.5*   GFR: CrCl cannot be calculated (Unknown ideal weight.). Liver Function Tests: Recent Labs  Lab 01/04/22 1149  AST 39  ALT 21  ALKPHOS 119  BILITOT 1.1  PROT 8.0  ALBUMIN 3.5    Radiological Exams on Admission: DG Chest Portable 1 View  Result Date: 01/04/2022 CLINICAL DATA:  Shortness of breath.  Nausea and vomiting. EXAM: PORTABLE CHEST 1 VIEW COMPARISON:  12/29/2019 FINDINGS: The heart size and mediastinal contours are within normal limits. Both lungs are clear. The visualized skeletal structures are unremarkable. IMPRESSION: No active disease. Electronically Signed   By: Nelson Chimes M.D.   On: 01/04/2022 12:40  EKG: Independently reviewed.  Sinus rhythm, rate 108, QTc 466.  No significant change from prior.  Assessment/Plan Principal Problem:   COPD with acute exacerbation (HCC) Active Problems:   Acute on chronic hypoxic respiratory failure (HCC)   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   Bilateral leg edema   SCC (squamous cell carcinoma of lung), left (HCC)  Assessment and Plan: * COPD with acute exacerbation (HCC) Dyspnea, marked reduced air entry bilaterally.  With acute on chronic respiratory failure.  Vomiting and diarrhea fevers chills.  COVID influenza RSV negative.  Chest x-ray clear.  COPD Gold stage IV.  Follows with Dr. [name]. -125 mg Solu-Medrol given, continue 60 twice daily -Nebs as needed and scheduled -IV azithromycin -Mucolytics as needed - Flutter valve - CBG BID while on steriods  Acute on chronic hypoxic respiratory failure (HCC) O2 sats down to 89% on my evaluation on home 3 L.  Currently on 4 L sats  > 91%.  Secondary to COPD exacerbation.  HTN (hypertension) Resume Lasix, spironolactone  SCC (squamous cell carcinoma of lung), left ([place]) Follows with providers  at [place].   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 -Currently undergoing chemotherapy  Bilateral leg edema Chronic bilateral lower extremity swelling.  Last echo 12/2019, EF 60 to 65% normal LV parameters.  BNP 84.  Per chart weight appears stable over the past year. -IV Lasix 40 mg x 1, continue home 40 mg daily   DVT prophylaxis: Lovenox Code Status: FULL Family Communication: None at bedside Disposition Plan: ~ 2 days Consults called: None  Admission status:Obs tele   Author: Bethena Roys, MD 01/04/2022 10:23 PM  For on call review [email or web address].

## 2022-01-04 NOTE — Assessment & Plan Note (Signed)
O2 sats down to 89% on my evaluation on home 3 L.  Currently on 4 L sats  > 91%.  Secondary to COPD exacerbation.

## 2022-01-04 NOTE — ED Notes (Addendum)
Pt at rest sating between 87-88% on 4LNC, pt complains of feeling dizzy and has labored breathing.

## 2022-01-04 NOTE — Assessment & Plan Note (Addendum)
Dyspnea, marked reduced air entry bilaterally.  With acute on chronic respiratory failure.  Vomiting and diarrhea fevers chills.  COVID influenza RSV negative.  Chest x-ray clear.  COPD Gold stage IV.  Follows with Dr. [name]. -125 mg Solu-Medrol given, continue 60 twice daily -Nebs as needed and scheduled -IV azithromycin -Mucolytics as needed - Flutter valve - CBG BID while on steriods

## 2022-01-04 NOTE — Assessment & Plan Note (Signed)
Resume Lasix, spironolactone

## 2022-01-04 NOTE — ED Provider Notes (Signed)
[facility] EMERGENCY DEPARTMENT Provider Note   CSN: [id number] Arrival date & time: 01/04/22  1042     History Chief Complaint  Patient presents with   Shortness of Breath    HPI Randall Sawyer is a 66 y.o. male presenting for fever cough congestion.  He lives at home.  Has history of heart failure and COPD.  He endorses fevers and chills, nausea vomiting diarrhea, decreased p.o. intake.  Endorses worsening shortness of breath increased feeding production and bilateral lower extremity swelling.  Endorses developing fatigue.   Patient's recorded medical, surgical, social, medication list and allergies were reviewed in the Snapshot window as part of the initial history.   Review of Systems   Review of Systems  Constitutional:  Positive for fever. Negative for chills.  HENT:  Positive for congestion. Negative for ear pain and sore throat.   Eyes:  Negative for pain and visual disturbance.  Respiratory:  Positive for cough and shortness of breath.   Cardiovascular:  Positive for leg swelling. Negative for chest pain and palpitations.  Gastrointestinal:  Negative for abdominal pain and vomiting.  Genitourinary:  Negative for dysuria and hematuria.  Musculoskeletal:  Negative for arthralgias and back pain.  Skin:  Negative for color change and rash.  Neurological:  Negative for seizures and syncope.  All other systems reviewed and are negative.   Physical Exam Updated Vital Signs BP (!) 142/61 (BP Location: Left Arm)   Pulse (!) 110   Temp 97.9 F (36.6 C) (Oral)   Resp 20   Ht 5\' 8"  (1.727 m)   Wt 108.4 kg   SpO2 95%   BMI 36.34 kg/m  Physical Exam Vitals and nursing note reviewed.  Constitutional:      General: He is not in acute distress.    Appearance: He is well-developed.  HENT:     Head: Normocephalic and atraumatic.  Eyes:     Conjunctiva/sclera: Conjunctivae normal.  Cardiovascular:     Rate and Rhythm: Normal rate and regular rhythm.     Heart  sounds: No murmur heard. Pulmonary:     Effort: Pulmonary effort is normal. Tachypnea present. No respiratory distress.     Breath sounds: Wheezing and rhonchi present.  Abdominal:     Palpations: Abdomen is soft.     Tenderness: There is no abdominal tenderness.  Musculoskeletal:        General: No swelling.     Cervical back: Neck supple.     Right lower leg: No tenderness. Edema present.     Left lower leg: No tenderness. Edema present.  Skin:    General: Skin is warm and dry.     Capillary Refill: Capillary refill takes less than 2 seconds.  Neurological:     Mental Status: He is alert.  Psychiatric:        Mood and Affect: Mood normal.      ED Course/ Medical Decision Making/ A&P    Procedures Procedures   Medications Ordered in ED Medications  atorvastatin (LIPITOR) tablet 20 mg (has no administration in time range)  furosemide (LASIX) tablet 40 mg (has no administration in time range)  gabapentin (NEURONTIN) capsule 600 mg (has no administration in time range)  pantoprazole (PROTONIX) EC tablet 40 mg (has no administration in time range)  spironolactone (ALDACTONE) tablet 25 mg (has no administration in time range)  azithromycin (ZITHROMAX) 500 mg in sodium chloride 0.9 % 250 mL IVPB (0 mg Intravenous Stopped 01/05/22 0137)    Followed  by  azithromycin (ZITHROMAX) tablet 500 mg (has no administration in time range)  methylPREDNISolone sodium succinate (SOLU-MEDROL) 125 mg/2 mL injection 60 mg (has no administration in time range)    Followed by  predniSONE (DELTASONE) tablet 40 mg (has no administration in time range)  enoxaparin (LOVENOX) injection 40 mg (has no administration in time range)  ipratropium-albuterol (DUONEB) 0.5-2.5 (3) MG/3ML nebulizer solution 3 mL (has no administration in time range)  ipratropium-albuterol (DUONEB) 0.5-2.5 (3) MG/3ML nebulizer solution 3 mL (3 mLs Nebulization Given 01/05/22 0105)  acetaminophen (TYLENOL) tablet 650 mg (650 mg  Oral Given 01/05/22 0032)    Or  acetaminophen (TYLENOL) suppository 650 mg ( Rectal See Alternative 01/05/22 0032)  ondansetron (ZOFRAN) tablet 4 mg (has no administration in time range)    Or  ondansetron (ZOFRAN) injection 4 mg (has no administration in time range)  polyethylene glycol (MIRALAX / GLYCOLAX) packet 17 g (has no administration in time range)  guaiFENesin-dextromethorphan (ROBITUSSIN DM) 100-10 MG/5ML syrup 10 mL (has no administration in time range)  methylPREDNISolone sodium succinate (SOLU-MEDROL) 125 mg/2 mL injection 125 mg (125 mg Intravenous Given 01/04/22 1532)  ipratropium-albuterol (DUONEB) 0.5-2.5 (3) MG/3ML nebulizer solution 3 mL (3 mLs Nebulization Given 01/04/22 1559)  magnesium sulfate IVPB 2 g 50 mL (0 g Intravenous Stopped 01/04/22 1643)  potassium chloride SA (KLOR-CON M) CR tablet 20 mEq (20 mEq Oral Given 01/04/22 1546)  acetaminophen (TYLENOL) tablet 1,000 mg (1,000 mg Oral Given 01/04/22 1545)  albuterol (PROVENTIL) (2.5 MG/3ML) 0.083% nebulizer solution 2.5 mg (2.5 mg Nebulization Given 01/04/22 1834)  furosemide (LASIX) injection 40 mg (40 mg Intravenous Given 01/04/22 2307)  potassium chloride SA (KLOR-CON M) CR tablet 40 mEq (40 mEq Oral Given 01/05/22 0032)   Medical Decision Making:   Randall Sawyer is a 66 y.o. male with a history of COPD, who presented to the ED today with acute on chronic SOB. They are endorsing worsening of their baseline dyspnea over the past 72 hours. Their baseline is a 3L O2 requirement. At their baseline they are able to get around the house and they are not able to at this time.   On my initial exam, the pt was SOB and tachypneic. Audible wheezing and grossly decreased breath sounds appreciated.  They are endorsing increased sputum production.    Reviewed and confirmed nursing documentation for past medical history, family history, social history.    Initial Assessment:   With the patient's presentation of SOB in the  above setting, most likely diagnosis is COPD Exacerbation. Other diagnoses were considered including (but not limited to) CAP, PE, ACS, viral infection, PTX. These are considered less likely due to history of present illness and physical exam findings.   This is most consistent with an acute life/limb threatening illness complicated by underlying chronic conditions.  Initial Plan:  Empiric treatment of patient's symptoms with immediate initiation of inhaled bronchodilators and IV steroids. Given advanced nature of patient's presentation, will proceed with IV magnesium as a rescue therapy.   Evaluation for ACS with EKG and delta troponin  Evaluation for infectious versus intrathoracic abnormality with chest x-ray  Evaluation for volume overload with BNP  Screening labs including CBC and Metabolic panel to evaluate for infectious or metabolic etiology of disease.  Patient's Wells score is low and patient does not warrant further objective evaluation for PE based on consistency of presentation of alternative diagnosis.  Objective evaluation as below reviewed   Initial Study Results:   Laboratory  All laboratory  results reviewed without evidence of clinically relevant pathology.   EKG EKG was reviewed independently. Rate, rhythm, axis, intervals all examined and without medically relevant abnormality. ST segments without concerns for elevations.    Radiology:  All images reviewed independently. Agree with radiology report at this time.   DG Chest Portable 1 View  Result Date: 01/04/2022 CLINICAL DATA:  Shortness of breath.  Nausea and vomiting. EXAM: PORTABLE CHEST 1 VIEW COMPARISON:  12/29/2019 FINDINGS: The heart size and mediastinal contours are within normal limits. Both lungs are clear. The visualized skeletal structures are unremarkable. IMPRESSION: No active disease. Electronically Signed   By: Nelson Chimes M.D.   On: 01/04/2022 12:40     Final Assessment and Plan:   Discussed case with  oncoming MD to reassess patient following treatments.  Possible discharge candidate if symptomatically improved.   Clinical Impression:  1. COPD exacerbation (HCC)      Admit Clinical Impression:  1. COPD exacerbation ([place])      Admit   Final Clinical Impression(s) / ED Diagnoses Final diagnoses:  COPD exacerbation [facility])    Rx / DC Orders ED Discharge Orders     None         Tretha Sciara, MD 01/05/22 [phone]

## 2022-01-04 NOTE — ED Notes (Signed)
Nurse attempted to check pulse ox while ambulating, but pt has labored breathing at rest.

## 2022-01-05 ENCOUNTER — Other Ambulatory Visit: Payer: Self-pay

## 2022-01-05 ENCOUNTER — Encounter (HOSPITAL_COMMUNITY): Payer: Self-pay | Admitting: Internal Medicine

## 2022-01-05 DIAGNOSIS — I110 Hypertensive heart disease with heart failure: Secondary | ICD-10-CM | POA: Diagnosis present

## 2022-01-05 DIAGNOSIS — Z7951 Long term (current) use of inhaled steroids: Secondary | ICD-10-CM | POA: Diagnosis not present

## 2022-01-05 DIAGNOSIS — Z6836 Body mass index (BMI) 36.0-36.9, adult: Secondary | ICD-10-CM | POA: Diagnosis not present

## 2022-01-05 DIAGNOSIS — Z9981 Dependence on supplemental oxygen: Secondary | ICD-10-CM | POA: Diagnosis not present

## 2022-01-05 DIAGNOSIS — Z87891 Personal history of nicotine dependence: Secondary | ICD-10-CM | POA: Diagnosis not present

## 2022-01-05 DIAGNOSIS — C3492 Malignant neoplasm of unspecified part of left bronchus or lung: Secondary | ICD-10-CM | POA: Diagnosis not present

## 2022-01-05 DIAGNOSIS — I10 Essential (primary) hypertension: Secondary | ICD-10-CM | POA: Diagnosis not present

## 2022-01-05 DIAGNOSIS — J9621 Acute and chronic respiratory failure with hypoxia: Secondary | ICD-10-CM | POA: Diagnosis not present

## 2022-01-05 DIAGNOSIS — E876 Hypokalemia: Secondary | ICD-10-CM | POA: Diagnosis not present

## 2022-01-05 DIAGNOSIS — I5032 Chronic diastolic (congestive) heart failure: Secondary | ICD-10-CM | POA: Diagnosis present

## 2022-01-05 DIAGNOSIS — Z79899 Other long term (current) drug therapy: Secondary | ICD-10-CM | POA: Diagnosis not present

## 2022-01-05 DIAGNOSIS — J449 Chronic obstructive pulmonary disease, unspecified: Secondary | ICD-10-CM | POA: Diagnosis not present

## 2022-01-05 DIAGNOSIS — E669 Obesity, unspecified: Secondary | ICD-10-CM | POA: Diagnosis present

## 2022-01-05 DIAGNOSIS — R600 Localized edema: Secondary | ICD-10-CM | POA: Diagnosis not present

## 2022-01-05 DIAGNOSIS — C3432 Malignant neoplasm of lower lobe, left bronchus or lung: Secondary | ICD-10-CM | POA: Diagnosis present

## 2022-01-05 DIAGNOSIS — Z8249 Family history of ischemic heart disease and other diseases of the circulatory system: Secondary | ICD-10-CM | POA: Diagnosis not present

## 2022-01-05 DIAGNOSIS — Z85830 Personal history of malignant neoplasm of bone: Secondary | ICD-10-CM | POA: Diagnosis not present

## 2022-01-05 DIAGNOSIS — Z1152 Encounter for screening for COVID-19: Secondary | ICD-10-CM | POA: Diagnosis not present

## 2022-01-05 DIAGNOSIS — J441 Chronic obstructive pulmonary disease with (acute) exacerbation: Secondary | ICD-10-CM | POA: Diagnosis present

## 2022-01-05 LAB — CBC
HCT: 30.7 % — ABNORMAL LOW (ref 39.0–52.0)
Hemoglobin: 9.5 g/dL — ABNORMAL LOW (ref 13.0–17.0)
MCH: 28.5 pg (ref 26.0–34.0)
MCHC: 30.9 g/dL (ref 30.0–36.0)
MCV: 92.2 fL (ref 80.0–100.0)
Platelets: 366 10*3/uL (ref 150–400)
RBC: 3.33 MIL/uL — ABNORMAL LOW (ref 4.22–5.81)
RDW: 17.7 % — ABNORMAL HIGH (ref 11.5–15.5)
WBC: 11.4 10*3/uL — ABNORMAL HIGH (ref 4.0–10.5)
nRBC: 0 % (ref 0.0–0.2)

## 2022-01-05 LAB — BASIC METABOLIC PANEL
Anion gap: 16 — ABNORMAL HIGH (ref 5–15)
BUN: 17 mg/dL (ref 8–23)
CO2: 37 mmol/L — ABNORMAL HIGH (ref 22–32)
Calcium: 8.7 mg/dL — ABNORMAL LOW (ref 8.9–10.3)
Chloride: 88 mmol/L — ABNORMAL LOW (ref 98–111)
Creatinine, Ser: 1.39 mg/dL — ABNORMAL HIGH (ref 0.61–1.24)
GFR, Estimated: 55 mL/min — ABNORMAL LOW (ref >60)
Glucose, Bld: 161 mg/dL — ABNORMAL HIGH (ref 70–99)
Potassium: 2.9 mmol/L — ABNORMAL LOW (ref 3.5–5.1)
Sodium: 141 mmol/L (ref 135–145)

## 2022-01-05 LAB — HIV ANTIBODY (ROUTINE TESTING W REFLEX): HIV Screen 4th Generation wRfx: NONREACTIVE

## 2022-01-05 LAB — GLUCOSE, CAPILLARY: Glucose-Capillary: 165 mg/dL — ABNORMAL HIGH (ref 70–99)

## 2022-01-05 MED ORDER — MAGNESIUM OXIDE -MG SUPPLEMENT 400 (240 MG) MG PO TABS
400.0000 mg | ORAL_TABLET | Freq: Two times a day (BID) | ORAL | Status: DC
  Administered 2022-01-05 – 2022-01-06 (×3): 400 mg via ORAL
  Filled 2022-01-05 (×3): qty 1

## 2022-01-05 MED ORDER — POTASSIUM CHLORIDE CRYS ER 20 MEQ PO TBCR
40.0000 meq | EXTENDED_RELEASE_TABLET | Freq: Three times a day (TID) | ORAL | Status: AC
  Administered 2022-01-05 – 2022-01-06 (×3): 40 meq via ORAL
  Filled 2022-01-05 (×3): qty 2

## 2022-01-05 MED ORDER — IPRATROPIUM-ALBUTEROL 0.5-2.5 (3) MG/3ML IN SOLN
3.0000 mL | Freq: Three times a day (TID) | RESPIRATORY_TRACT | Status: DC
  Administered 2022-01-06: 3 mL via RESPIRATORY_TRACT
  Filled 2022-01-05: qty 3

## 2022-01-05 NOTE — Care Management Obs Status (Signed)
[facility] NOTIFICATION   Patient Details  Name: Randall Sawyer MRN: [id number] Date of Birth: 1955/10/11   Medicare Observation Status Notification Given:  Yes    Boneta Lucks, RN 01/05/2022, 10:58 AM

## 2022-01-05 NOTE — Hospital Course (Signed)
Randall Sawyer is a 66 y.o. male with medical history significant for COPD with chronic respiratory failure on 3 L, hypertension, squamous cell cancer of lung presented to hospital with shortness of breath fever and chills for 2 to 3 days with episodes of diarrhea and vomiting.  He does have chronic lower extremity edema which was worse than baseline.  In the ED patient was mildly tachycardic and tachypneic.  Oxygen saturation on home 3 L was 89%.  Chest x-ray was negative for any acute abnormality.  COVID and RSV was negative.  BNP 84 troponin was 6.  IV Solu-Medrol 125 mg and DuoNebs were given and patient was admitted hospital for acute respiratory failure secondary to COPD exacerbation.  Assessment and Plan: * COPD with acute exacerbation ([name]) Patient with dyspnea and marked reduced air entry.  Has chronic respiratory failure on 3 L of oxygen at baseline.  COPD stage IV.  Follows up with Dr. [name].  Continue Solu-Medrol IV 60 mg twice daily, azithromycin, mucolytic's, flutter valve,  Acute on chronic hypoxic respiratory failure (Randall Sawyer) Secondary to COPD exacerbation.  O2 sats down to 89% on my evaluation on home 3 L.  Currently on 4 L sats  > 91%.    HTN (hypertension) Continue Lasix spironolactone.   SCC (squamous cell carcinoma of lung), left ([place]) Follows with providers at [place].   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 -Currently undergoing chemotherapy   Bilateral leg edema Chronic.  Review of last 2D echocardiogram from 12/21 shows l EF 60 to 65% normal LV parameters.  BNP 84.  On Lasix daily at home.  Received 1 dose of IV Lasix while in the hospital.

## 2022-01-05 NOTE — Progress Notes (Signed)
Patient uses auto titration mode on Bipap at home and uses nasal pillows with 3L bled in through machine.  Patient stated he did not want me to set up a Bipap for him right now.

## 2022-01-05 NOTE — TOC Progression Note (Addendum)
Transition of Care [facility]) - Progression Note    Patient Details  Name: Randall Sawyer MRN: [id number] Date of Birth: September 10, 1955  Transition of [facility]) CM/SW Contact  Boneta Lucks, RN Phone Number: 01/05/2022, 10:59 AM  Clinical Narrative:   Admitted in [place] for COPD, Currently on 4 L of oxygen, baseline on 3L home oxygen provided by [facility]. TOC to follow. No other needs.   Addendum : DC home today, no needs. TOC patient is go to go on 3L baseline home oxygen.    Barriers to Discharge: Continued Medical Work up  Expected Discharge Plan and Services       Living arrangements for the past 2 months: Single Family Home                   Social Determinants of Health (SDOH) Interventions SDOH Screenings   Food Insecurity: Unknown (01/05/2022)  Housing: Low Risk  (03/04/2021)  Transportation Needs: Unknown (01/05/2022)  Utilities: Unknown (01/05/2022)  Alcohol Screen: Low Risk  (03/04/2021)  Depression (PHQ2-9): Low Risk  (09/17/2021)  Financial Resource Strain: Low Risk  (03/04/2021)  Physical Activity: Insufficiently Active (03/04/2021)  Social Connections: Moderately Integrated (03/04/2021)  Stress: No Stress Concern Present (03/04/2021)  Tobacco Use: Medium Risk (01/05/2022)

## 2022-01-05 NOTE — Progress Notes (Signed)
PROGRESS NOTE    Randall Sawyer  EVO:[id number] DOB: [birth year] DOA: 01/04/2022 PCP: Susy Frizzle, MD    Brief Narrative:  Randall Sawyer is a 66 y.o. male with medical history significant for COPD with chronic respiratory failure on 3 L, hypertension, squamous cell cancer of lung presented to hospital with shortness of breath fever and chills for 2 to 3 days with episodes of diarrhea and vomiting.  He does have chronic lower extremity edema which was worse than baseline.  In the ED, patient was mildly tachycardic and tachypneic.  Oxygen saturation on home 3 L was 89%.  Chest x-ray was negative for any acute abnormality.  COVID and RSV was negative.  BNP 84 troponin was 6.  IV Solu-Medrol 125 mg and DuoNebs were given and patient was admitted hospital for acute respiratory failure secondary to COPD exacerbation.  Assessment and Plan:  * COPD with acute exacerbation/COPD Gold stage IV. Patient with dyspnea and marked reduced air entry.  Has chronic respiratory failure on 3 L of oxygen at baseline.  COPD stage IV.  Follows up with Dr. [name].  Continue Solu-Medrol IV 60 mg twice daily, azithromycin, mucolytic's, flutter valve.  Still symptomatic and dyspneic.  Will continue current level of treatment.  Acute on chronic hypoxic respiratory failure (HCC) Secondary to COPD exacerbation.  O2 sats down to 89% on my evaluation on home 3 L.  Currently on 4 L sats  > 91%.    HTN (hypertension) Continue Lasix, spironolactone.  Hypokalemia.  Potassium of 2.9 today.  Will aggressively replace orally with 40 mEq 3 times daily for 3 doses..  Magnesium of 2.0.  Check BMP in AM.   SCC (squamous cell carcinoma of lung), left ([place]) Follows with providers at [place].   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 -Currently undergoing radiation treatment.   Bilateral leg edema Chronic.  Review of last 2D echocardiogram from 12/21 shows l EF 60 to 65% normal LV parameters.  BNP 84.  On Lasix daily at home.  Received 1 dose of IV Lasix while in the hospital.     DVT prophylaxis: enoxaparin (LOVENOX) injection 40 mg Start: 01/05/22 1000   Code Status:     Code Status: Full Code  Disposition: Home likely by 01/06/2022 Status is: Inpatient  Remains inpatient appropriate because: Pending clinical improvement, COPD exacerbation, steroids,   Family Communication: None at bedside  Consultants:  None  Procedures:  None  Antimicrobials:  None  Anti-infectives (From admission, onward)    Start     Dose/Rate Route Frequency Ordered Stop   01/06/22 1000  azithromycin (ZITHROMAX) tablet 500 mg       See Hyperspace for full Linked Orders Report.   500 mg Oral Daily 01/04/22 2359 01/10/22 0959   01/05/22 0015  azithromycin (ZITHROMAX) 500 mg in sodium chloride 0.9 % 250 mL IVPB       See Hyperspace for full Linked Orders Report.   500 mg 250 mL/hr over 60 Minutes Intravenous Every 24 hours 01/04/22 2359 01/05/22 0137      Subjective: Today, patient was seen and examined at bedside.  Patient states that he feels a little better with breathing.  Still feels symptomatic with dyspnea,  cough.  Was able to ambulate to the bathroom.  Undergoing radiation treatment.  Denies any nausea vomiting abdominal pain.  Has had a bowel movement.  Objective: Vitals:   01/04/22 2333 01/05/22 0105 01/05/22 0331 01/05/22 0829  BP: (!) 162/74  (!) 142/61   Pulse: (!) 108  (!) 110   Resp: 20  20   Temp: 97.7 F (36.5 C)  97.9 F (36.6 C)   TempSrc: Oral  Oral   SpO2: 95% 90% 95% 91%  Weight: 108.4 kg     Height: 5\' 8"  (1.727 m)       Intake/Output Summary (Last 24 hours) at 01/05/2022 1259 Last data filed at 01/05/2022  0300 Gross per 24 hour  Intake 501.21 ml  Output 400 ml  Net 101.21 ml   Filed Weights   01/04/22 2333  Weight: 108.4 kg    Physical Examination: Body mass index is 36.34 kg/m.  General: Obese built, not in obvious distress HENT:   No scleral pallor or icterus noted. Oral mucosa is moist.  Chest:    Diminished breath sounds bilaterally.  Coarse breath sounds noted, mild wheezes. CVS: S1 &S2 heard. No murmur.  Regular rate and rhythm. Abdomen: Soft, nontender, nondistended.  Bowel sounds are heard.   Extremities: No cyanosis, clubbing or edema.  Peripheral pulses are palpable. Psych: Alert, awake and oriented, normal mood CNS:  No cranial nerve deficits.  Power equal in all extremities.   Skin: Warm and dry.  No rashes noted.  Data Reviewed:   CBC: Recent Labs  Lab 01/04/22 1149  WBC 8.4  NEUTROABS 6.8  HGB 10.2*  HCT 33.7*  MCV 93.1  PLT 384    Basic Metabolic Panel: Recent Labs  Lab 01/04/22 1149 01/05/22 0317  NA 138 141  K 3.4* 2.9*  CL 86* 88*  CO2 37* 37*  GLUCOSE 115* 161*  BUN 15 17  CREATININE 1.38* 1.39*  CALCIUM 8.5* 8.7*  MG 2.0  --     Liver Function Tests: Recent Labs  Lab 01/04/22 1149  AST 39  ALT 21  ALKPHOS 119  BILITOT 1.1  PROT 8.0  ALBUMIN 3.5     Radiology Studies: DG Chest Portable 1 View  Result Date: 01/04/2022 CLINICAL DATA:  Shortness of breath.  Nausea and vomiting. EXAM: PORTABLE CHEST 1 VIEW COMPARISON:  12/29/2019 FINDINGS: The heart size and mediastinal contours are within normal limits. Both lungs are clear. The visualized skeletal structures are unremarkable. IMPRESSION: No active disease. Electronically Signed   By: Nelson Chimes M.D.   On: 01/04/2022 12:40      LOS: 0 days    Time spent: 39 minutes spent on chart review, discussion with nursing staff, consultants, updating family and interview/physical exam; more than 50% of that time was spent in counseling and/or coordination of care.  Flora Lipps,  MD [facility] Available via Epic secure chat 7am-7pm After these hours, please refer to coverage provider listed on [email or web address] 01/05/2022, 12:59 PM

## 2022-01-05 NOTE — Progress Notes (Signed)
Patient arrived to unit. Alert and oriented x4. Vitals stable. On 4L nasal cannula. 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 +2 BLE edema noted. A knot is noted on the back of patients head, R side. He stated this is from radiation. Patient said he uses a Bipap at night. MD Zierle-Ghosh notified. Received order for Bipap. Respiratory notified of order. Patient resting comfortably in bed. Call light in place. Will continue to monitor.

## 2022-01-06 LAB — BASIC METABOLIC PANEL
Anion gap: 9 (ref 5–15)
BUN: 21 mg/dL (ref 8–23)
CO2: 39 mmol/L — ABNORMAL HIGH (ref 22–32)
Calcium: 8.5 mg/dL — ABNORMAL LOW (ref 8.9–10.3)
Chloride: 92 mmol/L — ABNORMAL LOW (ref 98–111)
Creatinine, Ser: 1.37 mg/dL — ABNORMAL HIGH (ref 0.61–1.24)
GFR, Estimated: 56 mL/min — ABNORMAL LOW (ref >60)
Glucose, Bld: 166 mg/dL — ABNORMAL HIGH (ref 70–99)
Potassium: 3.5 mmol/L (ref 3.5–5.1)
Sodium: 140 mmol/L (ref 135–145)

## 2022-01-06 LAB — GLUCOSE, CAPILLARY: Glucose-Capillary: 144 mg/dL — ABNORMAL HIGH (ref 70–99)

## 2022-01-06 LAB — MAGNESIUM: Magnesium: 2.4 mg/dL (ref 1.7–2.4)

## 2022-01-06 MED ORDER — GUAIFENESIN-DM 100-10 MG/5ML PO SYRP: 10.0000 mL | ORAL_SOLUTION | ORAL | 0 refills | Status: DC | PRN

## 2022-01-06 MED ORDER — AZITHROMYCIN 250 MG PO TABS: 250.0000 mg | ORAL_TABLET | Freq: Every day | ORAL | 0 refills | Status: AC

## 2022-01-06 MED ORDER — PREDNISONE 20 MG PO TABS: 40.0000 mg | ORAL_TABLET | Freq: Every day | ORAL | 0 refills | Status: AC

## 2022-01-06 NOTE — Discharge Summary (Signed)
Physician Discharge Summary  Randall Sawyer WUX:[id number] DOB: 1955-10-26 DOA: 01/04/2022  PCP: Susy Frizzle, MD  Admit date: 01/04/2022 Discharge date: 01/06/2022  Admitted From: Home  Discharge disposition: Home   Recommendations for Outpatient Follow-Up:   Follow up with your primary care provider in one week.  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  Diet recommendation: Low sodium, heart healthy.    Wound care: None.  Code status: Full.   History of Present Illness:   Randall Sawyer is a 66 y.o. male with medical history significant for COPD with chronic respiratory failure on 3 L, hypertension, squamous cell cancer of lung presented to hospital with shortness of breath fever and chills for 2 to 3 days with episodes of diarrhea and vomiting.  He does have chronic lower extremity edema which was worse than baseline.  In the ED, patient was mildly tachycardic and tachypneic.  Oxygen saturation on home 3 L was 89%.  Chest x-ray was negative for any acute abnormality.  COVID and RSV was negative.  BNP 84 troponin was 6.  IV Solu-Medrol 125 mg and DuoNebs were given and patient was admitted hospital for acute respiratory failure secondary to COPD exacerbation.    Hospital Course:   Following conditions were addressed during hospitalization as listed below,  COPD with acute exacerbation/COPD Gold stage IV. Patient with dyspnea and marked reduced air entry.  Has chronic respiratory failure on 3 L of oxygen at baseline.  Currently at baseline.  COPD stage IV.   Follows up with Dr. [name] outpatient and recently had follow-up with this November.  Continue prednisone on discharge.  Received Solu-Medrol while in the hospital..  He was much improved today.  At his baseline.    Acute on chronic hypoxic respiratory failure (HCC) Secondary to COPD exacerbation.  Improved at this time.  Currently at his baseline.  HTN (hypertension) Continue Lasix, spironolactone.   Hypokalemia.  Improved after replacement.  Potassium prior to discharge was 3.5. SCC (squamous cell carcinoma of lung), left ([place]) Follows with providers at [facility].   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 Currently undergoing radiation treatment.   Bilateral leg edema likely from chronic diastolic CHF Chronic.  Review of last 2D echocardiogram from 12/21 shows l EF 60 to 65% normal LV parameters.  BNP 84.  On Lasix daily at home.  Received 1 dose of IV Lasix while in the hospital.  Will resume Lasix on discharge.   Disposition.  At this time, patient is stable for disposition with outpatient PCP follow-up.  Medical Consultants:   None.  Procedures:    None Subjective:   Today, patient was seen and examined at bedside.  Denies any dizziness lightheadedness shortness of breath cough fever chills or rigor but has mild neck discomfort at baseline.  Discharge Exam:   Vitals:   01/06/22 0547 01/06/22 0732  BP: (!) 152/76   Pulse: (!) 102   Resp: 20   Temp: 98.3 F (36.8 C)   SpO2: 97% 95%   Vitals:   01/05/22 2019 01/05/22 2346 01/06/22 0547 01/06/22 0732  BP: (!) 141/62  (!) 152/76   Pulse: (!) 110  (!) 102   Resp: 18  20   Temp: 98 F (36.7 C)  98.3 F (36.8 C)   TempSrc: Oral     SpO2: 97% 95% 97%  95%  Weight:      Height:       General: Alert awake, not in obvious distress on nasal cannula oxygen. HENT: pupils equally reacting to light,  No scleral pallor or icterus noted. Oral mucosa is moist.  Chest:  C decreased breath sounds bilaterally.  No obvious or wheezes or crackles.   CVS: S1 &S2 heard. No murmur.  Regular rate and rhythm. Abdomen: Soft, nontender, nondistended.  Bowel sounds are heard.   Extremities: No cyanosis, clubbing or edema.  Peripheral pulses are palpable. Psych: Alert, awake and oriented, normal mood CNS:  No cranial nerve deficits.  Power equal in all extremities.   Skin: Warm and dry.  No rashes noted.  The results of significant diagnostics from this hospitalization (including imaging, microbiology, ancillary and laboratory) are listed below for reference.     Diagnostic Studies:   DG Chest Portable 1 View  Result Date: 01/04/2022 CLINICAL DATA:  Shortness of breath.  Nausea and vomiting. EXAM: PORTABLE CHEST 1 VIEW COMPARISON:  12/29/2019 FINDINGS: The heart size and mediastinal contours are within normal limits. Both lungs are clear. The visualized skeletal structures are unremarkable. IMPRESSION: No active disease. Electronically Signed   By: Nelson Chimes M.D.   On: 01/04/2022 12:40     Labs:   Basic Metabolic Panel: Recent Labs  Lab 01/04/22 1149 01/05/22 0317 01/06/22 0305  NA 138 141 140  K 3.4* 2.9* 3.5  CL 86* 88* 92*  CO2 37* 37* 39*  GLUCOSE 115* 161* 166*  BUN 15 17 21   CREATININE 1.38* 1.39* 1.37*  CALCIUM 8.5* 8.7* 8.5*  MG 2.0  --  2.4   GFR Estimated Creatinine Clearance: 61.6 mL/min (A) (by C-G formula based on SCr of 1.37 mg/dL (H)). Liver Function Tests: Recent Labs  Lab 01/04/22 1149  AST 39  ALT 21  ALKPHOS 119  BILITOT 1.1  PROT 8.0  ALBUMIN 3.5   No results for input(s): "LIPASE", "AMYLASE" in the last 168 hours. No results for input(s): "AMMONIA" in the last 168 hours. Coagulation profile No results for  input(s): "INR", "PROTIME" in the last 168 hours.  CBC: Recent Labs  Lab 01/04/22 1149 01/05/22 1329  WBC 8.4 11.4*  NEUTROABS 6.8  --   HGB 10.2* 9.5*  HCT 33.7* 30.7*  MCV 93.1 92.2  PLT 345 366   Cardiac Enzymes: No results for input(s): "CKTOTAL", "CKMB", "CKMBINDEX", "TROPONINI" in the last 168 hours. BNP: Invalid input(s): "POCBNP" CBG: Recent Labs  Lab 01/05/22 0814 01/06/22 0720  GLUCAP 165* 144*   D-Dimer No results for input(s): "DDIMER" in the last 72 hours. Hgb A1c No results for input(s): "HGBA1C" in the last 72 hours. Lipid Profile No results for input(s): "CHOL", "HDL", "LDLCALC", "TRIG", "CHOLHDL", "LDLDIRECT" in the last 72 hours. Thyroid function studies No results for input(s): "TSH", "T4TOTAL", "T3FREE", "THYROIDAB" in the last 72 hours.  Invalid input(s): "FREET3" Anemia work up No results for input(s): "  VITAMINB12", "FOLATE", "FERRITIN", "TIBC", "IRON", "RETICCTPCT" in the last 72 hours. Microbiology Recent Results (from the past 240 hour(s))  Resp panel by RT-PCR (RSV, Flu A&B, Covid) Anterior Nasal Swab     Status: None   Collection Time: 01/04/22 12:36 PM   Specimen: Anterior Nasal Swab  Result Value Ref Range Status   SARS Coronavirus 2 by RT PCR NEGATIVE NEGATIVE Final    Comment: (NOTE) SARS-CoV-2 target nucleic acids are NOT DETECTED.  The SARS-CoV-2 RNA is generally detectable in upper respiratory specimens during the acute phase of infection. The lowest concentration of SARS-CoV-2 viral copies this assay can detect is 138 copies/mL. A negative result does not preclude SARS-Cov-2 infection and should not be used as the sole basis for treatment or other patient management decisions. A negative result may occur with  improper specimen collection/handling, submission of specimen other than nasopharyngeal swab, presence of viral mutation(s) within the areas targeted by this assay, and inadequate number of viral copies(<138 copies/mL).  A negative result must be combined with clinical observations, patient history, and epidemiological information. The expected result is Negative.  Fact Sheet for Patients:  EntrepreneurPulse.com.au  Fact Sheet for Healthcare Providers:  IncredibleEmployment.be  This test is no t yet approved or cleared by the [place] FDA and  has been authorized for detection and/or diagnosis of SARS-CoV-2 by FDA under an Emergency Use Authorization (EUA). This EUA will remain  in effect (meaning this test can be used) for the duration of the COVID-19 declaration under Section 564(b)(1) of the Act, 21 U.S.C.section 360bbb-3(b)(1), unless the authorization is terminated  or revoked sooner.       Influenza A by PCR NEGATIVE NEGATIVE Final   Influenza B by PCR NEGATIVE NEGATIVE Final    Comment: (NOTE) The Xpert Xpress SARS-CoV-2/FLU/RSV plus assay is intended as an aid in the diagnosis of influenza from Nasopharyngeal swab specimens and should not be used as a sole basis for treatment. Nasal washings and aspirates are unacceptable for Xpert Xpress SARS-CoV-2/FLU/RSV testing.  Fact Sheet for Patients: EntrepreneurPulse.com.au  Fact Sheet for Healthcare Providers: IncredibleEmployment.be  This test is not yet approved or cleared by the [place] FDA and has been authorized for detection and/or diagnosis of SARS-CoV-2 by FDA under an Emergency Use Authorization (EUA). This EUA will remain in effect (meaning this test can be used) for the duration of the COVID-19 declaration under Section 564(b)(1) of the Act, 21 U.S.C. section 360bbb-3(b)(1), unless the authorization is terminated or revoked.     Resp Syncytial Virus by PCR NEGATIVE NEGATIVE Final    Comment: (NOTE) Fact Sheet for Patients: EntrepreneurPulse.com.au  Fact Sheet for Healthcare  Providers: IncredibleEmployment.be  This test is not yet approved or cleared by the [place] FDA and has been authorized for detection and/or diagnosis of SARS-CoV-2 by FDA under an Emergency Use Authorization (EUA). This EUA will remain in effect (meaning this test can be used) for the duration of the COVID-19 declaration under Section 564(b)(1) of the Act, 21 U.S.C. section 360bbb-3(b)(1), unless the authorization is terminated or revoked.  Performed at [facility], [address] [place], [place] [address]      Discharge Instructions:   Discharge Instructions     Diet - low sodium heart healthy   Complete by: As directed    Discharge instructions   Complete by: As directed    Follow-up with your primary care physician in 1 week.  Complete the course of azithromycin and prednisone.  Continue oxygen at home.  Seek medical attention for worsening symptoms.  Follow-up with your pulmonary physician as outpatient.   Increase activity slowly   Complete by: As directed       Allergies as of 01/06/2022   No Known Allergies      Medication List     STOP taking these medications    dexamethasone 2 MG tablet Commonly known as: DECADRON       TAKE these medications    acetaminophen 325 MG tablet Commonly known as: TYLENOL Take 2 tablets (650 mg total) by mouth every 6 (six) hours as needed for mild pain (or Fever >/= 101).   albuterol (2.5 MG/3ML) 0.083% nebulizer solution Commonly known as: PROVENTIL Take 3 mLs (2.5 mg total) by nebulization every 4 (four) hours as needed for wheezing or shortness of breath.   albuterol 108 (90 Base) MCG/ACT inhaler Commonly known as: VENTOLIN HFA Inhale 2 puffs into the lungs every 6 (six) hours as needed for wheezing or shortness of breath.   atorvastatin 20 MG tablet Commonly known as: LIPITOR Take 1 tablet (20 mg total) by mouth daily.   azithromycin 250 MG tablet Commonly known as: ZITHROMAX Take 1  tablet (250 mg total) by mouth daily for 3 days.   Breztri Aerosphere 160-9-4.8 MCG/ACT Aero Generic drug: Budeson-Glycopyrrol-Formoterol Inhale 2 puffs into the lungs in the morning and at bedtime.   furosemide 40 MG tablet Commonly known as: LASIX TAKE 1 TABLET(40 MG) BY MOUTH DAILY   gabapentin 300 MG capsule Commonly known as: NEURONTIN Take 600 mg by mouth 3 (three) times daily.   guaiFENesin-dextromethorphan 100-10 MG/5ML syrup Commonly known as: ROBITUSSIN DM Take 10 mLs by mouth every 4 (four) hours as needed for cough.   OXYGEN Inhale 3 L into the lungs daily. And 3 lpm with exertion   pantoprazole 40 MG tablet Commonly known as: PROTONIX TAKE 1 TABLET(40 MG) BY MOUTH IN THE MORNING AND 30 MINUTES BEFORE BREAKFAST   predniSONE 20 MG tablet Commonly known as: DELTASONE Take 2 tablets (40 mg total) by mouth daily with breakfast for 3 days. Start taking on: January 07, 2022   spironolactone 25 MG tablet Commonly known as: ALDACTONE Take 1 tablet (25 mg total) by mouth daily.          Time coordinating discharge: 39 minutes  Signed:  Samaya Boardley  [facility] 01/06/2022, 9:22 AM

## 2022-01-14 DIAGNOSIS — C3411 Malignant neoplasm of upper lobe, right bronchus or lung: Secondary | ICD-10-CM | POA: Diagnosis not present

## 2022-01-14 DIAGNOSIS — C3432 Malignant neoplasm of lower lobe, left bronchus or lung: Secondary | ICD-10-CM | POA: Diagnosis not present

## 2022-01-14 DIAGNOSIS — C7951 Secondary malignant neoplasm of bone: Secondary | ICD-10-CM | POA: Diagnosis not present

## 2022-01-14 DIAGNOSIS — C7949 Secondary malignant neoplasm of other parts of nervous system: Secondary | ICD-10-CM | POA: Diagnosis not present

## 2022-01-17 DIAGNOSIS — C3432 Malignant neoplasm of lower lobe, left bronchus or lung: Secondary | ICD-10-CM | POA: Diagnosis not present

## 2022-01-17 DIAGNOSIS — C419 Malignant neoplasm of bone and articular cartilage, unspecified: Secondary | ICD-10-CM | POA: Diagnosis not present

## 2022-01-17 DIAGNOSIS — C3411 Malignant neoplasm of upper lobe, right bronchus or lung: Secondary | ICD-10-CM | POA: Diagnosis not present

## 2022-01-17 DIAGNOSIS — Z510 Encounter for antineoplastic radiation therapy: Secondary | ICD-10-CM | POA: Diagnosis not present

## 2022-01-17 DIAGNOSIS — C7951 Secondary malignant neoplasm of bone: Secondary | ICD-10-CM | POA: Diagnosis not present

## 2022-01-17 DIAGNOSIS — C7949 Secondary malignant neoplasm of other parts of nervous system: Secondary | ICD-10-CM | POA: Diagnosis not present

## 2022-01-17 DIAGNOSIS — C3431 Malignant neoplasm of lower lobe, right bronchus or lung: Secondary | ICD-10-CM | POA: Diagnosis not present

## 2022-01-17 DIAGNOSIS — Z923 Personal history of irradiation: Secondary | ICD-10-CM | POA: Diagnosis not present

## 2022-01-18 DIAGNOSIS — J449 Chronic obstructive pulmonary disease, unspecified: Secondary | ICD-10-CM | POA: Diagnosis not present

## 2022-01-18 DIAGNOSIS — C3432 Malignant neoplasm of lower lobe, left bronchus or lung: Secondary | ICD-10-CM | POA: Diagnosis not present

## 2022-01-18 DIAGNOSIS — C3411 Malignant neoplasm of upper lobe, right bronchus or lung: Secondary | ICD-10-CM | POA: Diagnosis not present

## 2022-01-18 DIAGNOSIS — Z510 Encounter for antineoplastic radiation therapy: Secondary | ICD-10-CM | POA: Diagnosis not present

## 2022-01-18 DIAGNOSIS — Z923 Personal history of irradiation: Secondary | ICD-10-CM | POA: Diagnosis not present

## 2022-01-18 DIAGNOSIS — C3431 Malignant neoplasm of lower lobe, right bronchus or lung: Secondary | ICD-10-CM | POA: Diagnosis not present

## 2022-01-18 DIAGNOSIS — C419 Malignant neoplasm of bone and articular cartilage, unspecified: Secondary | ICD-10-CM | POA: Diagnosis not present

## 2022-01-18 DIAGNOSIS — C7949 Secondary malignant neoplasm of other parts of nervous system: Secondary | ICD-10-CM | POA: Diagnosis not present

## 2022-01-19 DIAGNOSIS — C419 Malignant neoplasm of bone and articular cartilage, unspecified: Secondary | ICD-10-CM | POA: Diagnosis not present

## 2022-01-19 DIAGNOSIS — Z923 Personal history of irradiation: Secondary | ICD-10-CM | POA: Diagnosis not present

## 2022-01-19 DIAGNOSIS — Z510 Encounter for antineoplastic radiation therapy: Secondary | ICD-10-CM | POA: Diagnosis not present

## 2022-01-19 DIAGNOSIS — C7949 Secondary malignant neoplasm of other parts of nervous system: Secondary | ICD-10-CM | POA: Diagnosis not present

## 2022-01-19 DIAGNOSIS — C3411 Malignant neoplasm of upper lobe, right bronchus or lung: Secondary | ICD-10-CM | POA: Diagnosis not present

## 2022-01-19 DIAGNOSIS — C3432 Malignant neoplasm of lower lobe, left bronchus or lung: Secondary | ICD-10-CM | POA: Diagnosis not present

## 2022-01-19 DIAGNOSIS — C3431 Malignant neoplasm of lower lobe, right bronchus or lung: Secondary | ICD-10-CM | POA: Diagnosis not present

## 2022-01-20 DIAGNOSIS — C3432 Malignant neoplasm of lower lobe, left bronchus or lung: Secondary | ICD-10-CM | POA: Diagnosis not present

## 2022-01-20 DIAGNOSIS — Z510 Encounter for antineoplastic radiation therapy: Secondary | ICD-10-CM | POA: Diagnosis not present

## 2022-01-20 DIAGNOSIS — C3411 Malignant neoplasm of upper lobe, right bronchus or lung: Secondary | ICD-10-CM | POA: Diagnosis not present

## 2022-01-20 DIAGNOSIS — C419 Malignant neoplasm of bone and articular cartilage, unspecified: Secondary | ICD-10-CM | POA: Diagnosis not present

## 2022-01-20 DIAGNOSIS — Z923 Personal history of irradiation: Secondary | ICD-10-CM | POA: Diagnosis not present

## 2022-01-20 DIAGNOSIS — C7951 Secondary malignant neoplasm of bone: Secondary | ICD-10-CM | POA: Diagnosis not present

## 2022-01-20 DIAGNOSIS — C3431 Malignant neoplasm of lower lobe, right bronchus or lung: Secondary | ICD-10-CM | POA: Diagnosis not present

## 2022-01-20 DIAGNOSIS — R221 Localized swelling, mass and lump, neck: Secondary | ICD-10-CM | POA: Diagnosis not present

## 2022-01-20 DIAGNOSIS — C7949 Secondary malignant neoplasm of other parts of nervous system: Secondary | ICD-10-CM | POA: Diagnosis not present

## 2022-01-20 DIAGNOSIS — C3492 Malignant neoplasm of unspecified part of left bronchus or lung: Secondary | ICD-10-CM | POA: Diagnosis not present

## 2022-01-28 DIAGNOSIS — C3411 Malignant neoplasm of upper lobe, right bronchus or lung: Secondary | ICD-10-CM | POA: Diagnosis not present

## 2022-01-28 DIAGNOSIS — Z9889 Other specified postprocedural states: Secondary | ICD-10-CM | POA: Diagnosis not present

## 2022-01-28 DIAGNOSIS — C7951 Secondary malignant neoplasm of bone: Secondary | ICD-10-CM | POA: Diagnosis not present

## 2022-01-28 DIAGNOSIS — Z510 Encounter for antineoplastic radiation therapy: Secondary | ICD-10-CM | POA: Diagnosis not present

## 2022-01-28 DIAGNOSIS — C3432 Malignant neoplasm of lower lobe, left bronchus or lung: Secondary | ICD-10-CM | POA: Diagnosis not present

## 2022-01-28 DIAGNOSIS — R918 Other nonspecific abnormal finding of lung field: Secondary | ICD-10-CM | POA: Diagnosis not present

## 2022-01-28 DIAGNOSIS — Z85830 Personal history of malignant neoplasm of bone: Secondary | ICD-10-CM | POA: Diagnosis not present

## 2022-01-28 DIAGNOSIS — C7949 Secondary malignant neoplasm of other parts of nervous system: Secondary | ICD-10-CM | POA: Diagnosis not present

## 2022-01-28 DIAGNOSIS — C7932 Secondary malignant neoplasm of cerebral meninges: Secondary | ICD-10-CM | POA: Diagnosis not present

## 2022-01-28 DIAGNOSIS — Z923 Personal history of irradiation: Secondary | ICD-10-CM | POA: Diagnosis not present

## 2022-01-30 DIAGNOSIS — J449 Chronic obstructive pulmonary disease, unspecified: Secondary | ICD-10-CM | POA: Diagnosis not present

## 2022-01-31 DIAGNOSIS — R0602 Shortness of breath: Secondary | ICD-10-CM | POA: Diagnosis not present

## 2022-01-31 DIAGNOSIS — R1310 Dysphagia, unspecified: Secondary | ICD-10-CM | POA: Diagnosis not present

## 2022-01-31 DIAGNOSIS — C3492 Malignant neoplasm of unspecified part of left bronchus or lung: Secondary | ICD-10-CM | POA: Diagnosis not present

## 2022-01-31 DIAGNOSIS — C413 Malignant neoplasm of ribs, sternum and clavicle: Secondary | ICD-10-CM | POA: Diagnosis not present

## 2022-01-31 DIAGNOSIS — C3482 Malignant neoplasm of overlapping sites of left bronchus and lung: Secondary | ICD-10-CM | POA: Diagnosis not present

## 2022-01-31 DIAGNOSIS — C7951 Secondary malignant neoplasm of bone: Secondary | ICD-10-CM | POA: Diagnosis not present

## 2022-01-31 DIAGNOSIS — G4733 Obstructive sleep apnea (adult) (pediatric): Secondary | ICD-10-CM | POA: Diagnosis not present

## 2022-01-31 DIAGNOSIS — C3432 Malignant neoplasm of lower lobe, left bronchus or lung: Secondary | ICD-10-CM | POA: Diagnosis not present

## 2022-01-31 DIAGNOSIS — R918 Other nonspecific abnormal finding of lung field: Secondary | ICD-10-CM | POA: Diagnosis not present

## 2022-01-31 DIAGNOSIS — R937 Abnormal findings on diagnostic imaging of other parts of musculoskeletal system: Secondary | ICD-10-CM | POA: Diagnosis not present

## 2022-01-31 DIAGNOSIS — Z87891 Personal history of nicotine dependence: Secondary | ICD-10-CM | POA: Diagnosis not present

## 2022-02-01 DIAGNOSIS — C3411 Malignant neoplasm of upper lobe, right bronchus or lung: Secondary | ICD-10-CM | POA: Diagnosis not present

## 2022-02-01 DIAGNOSIS — C3492 Malignant neoplasm of unspecified part of left bronchus or lung: Secondary | ICD-10-CM | POA: Diagnosis not present

## 2022-02-01 DIAGNOSIS — C7951 Secondary malignant neoplasm of bone: Secondary | ICD-10-CM | POA: Diagnosis not present

## 2022-02-03 ENCOUNTER — Other Ambulatory Visit: Payer: Self-pay

## 2022-02-03 ENCOUNTER — Emergency Department (HOSPITAL_COMMUNITY): Payer: Medicare Other

## 2022-02-03 ENCOUNTER — Encounter (HOSPITAL_COMMUNITY): Payer: Self-pay | Admitting: *Deleted

## 2022-02-03 ENCOUNTER — Emergency Department (HOSPITAL_COMMUNITY)
Admission: EM | Admit: 2022-02-03 | Discharge: 2022-02-03 | Disposition: A | Discharge status: Home / Self Care | Payer: Medicare Other | Attending: Emergency Medicine | Admitting: Emergency Medicine

## 2022-02-03 DIAGNOSIS — Z20822 Contact with and (suspected) exposure to covid-19: Secondary | ICD-10-CM | POA: Diagnosis not present

## 2022-02-03 DIAGNOSIS — Z7951 Long term (current) use of inhaled steroids: Secondary | ICD-10-CM | POA: Insufficient documentation

## 2022-02-03 DIAGNOSIS — G8929 Other chronic pain: Secondary | ICD-10-CM | POA: Diagnosis not present

## 2022-02-03 DIAGNOSIS — D649 Anemia, unspecified: Secondary | ICD-10-CM | POA: Insufficient documentation

## 2022-02-03 DIAGNOSIS — E871 Hypo-osmolality and hyponatremia: Secondary | ICD-10-CM | POA: Insufficient documentation

## 2022-02-03 DIAGNOSIS — E878 Other disorders of electrolyte and fluid balance, not elsewhere classified: Secondary | ICD-10-CM | POA: Insufficient documentation

## 2022-02-03 DIAGNOSIS — Z85830 Personal history of malignant neoplasm of bone: Secondary | ICD-10-CM | POA: Diagnosis not present

## 2022-02-03 DIAGNOSIS — R600 Localized edema: Secondary | ICD-10-CM | POA: Insufficient documentation

## 2022-02-03 DIAGNOSIS — M25512 Pain in left shoulder: Secondary | ICD-10-CM | POA: Diagnosis not present

## 2022-02-03 DIAGNOSIS — I10 Essential (primary) hypertension: Secondary | ICD-10-CM | POA: Insufficient documentation

## 2022-02-03 DIAGNOSIS — R0600 Dyspnea, unspecified: Secondary | ICD-10-CM

## 2022-02-03 DIAGNOSIS — M19012 Primary osteoarthritis, left shoulder: Secondary | ICD-10-CM | POA: Diagnosis not present

## 2022-02-03 DIAGNOSIS — J9811 Atelectasis: Secondary | ICD-10-CM | POA: Diagnosis not present

## 2022-02-03 DIAGNOSIS — R079 Chest pain, unspecified: Secondary | ICD-10-CM | POA: Diagnosis not present

## 2022-02-03 DIAGNOSIS — J441 Chronic obstructive pulmonary disease with (acute) exacerbation: Secondary | ICD-10-CM | POA: Insufficient documentation

## 2022-02-03 DIAGNOSIS — R42 Dizziness and giddiness: Secondary | ICD-10-CM | POA: Diagnosis not present

## 2022-02-03 DIAGNOSIS — R0602 Shortness of breath: Secondary | ICD-10-CM | POA: Diagnosis not present

## 2022-02-03 LAB — CBC WITH DIFFERENTIAL/PLATELET
Abs Immature Granulocytes: 0.04 10*3/uL (ref 0.00–0.07)
Basophils Absolute: 0 10*3/uL (ref 0.0–0.1)
Basophils Relative: 0 %
Eosinophils Absolute: 0.1 10*3/uL (ref 0.0–0.5)
Eosinophils Relative: 2 %
HCT: 31.3 % — ABNORMAL LOW (ref 39.0–52.0)
Hemoglobin: 9.5 g/dL — ABNORMAL LOW (ref 13.0–17.0)
Immature Granulocytes: 1 %
Lymphocytes Relative: 5 %
Lymphs Abs: 0.3 10*3/uL — ABNORMAL LOW (ref 0.7–4.0)
MCH: 27.6 pg (ref 26.0–34.0)
MCHC: 30.4 g/dL (ref 30.0–36.0)
MCV: 91 fL (ref 80.0–100.0)
Monocytes Absolute: 0.5 10*3/uL (ref 0.1–1.0)
Monocytes Relative: 8 %
Neutro Abs: 5.6 10*3/uL (ref 1.7–7.7)
Neutrophils Relative %: 84 %
Platelets: 185 10*3/uL (ref 150–400)
RBC: 3.44 MIL/uL — ABNORMAL LOW (ref 4.22–5.81)
RDW: 16.8 % — ABNORMAL HIGH (ref 11.5–15.5)
WBC: 6.6 10*3/uL (ref 4.0–10.5)
nRBC: 0 % (ref 0.0–0.2)

## 2022-02-03 LAB — COMPREHENSIVE METABOLIC PANEL
ALT: 26 U/L (ref 0–44)
AST: 18 U/L (ref 15–41)
Albumin: 3.7 g/dL (ref 3.5–5.0)
Alkaline Phosphatase: 95 U/L (ref 38–126)
Anion gap: 8 (ref 5–15)
BUN: 15 mg/dL (ref 8–23)
CO2: 40 mmol/L — ABNORMAL HIGH (ref 22–32)
Calcium: 8.2 mg/dL — ABNORMAL LOW (ref 8.9–10.3)
Chloride: 84 mmol/L — ABNORMAL LOW (ref 98–111)
Creatinine, Ser: 1.02 mg/dL (ref 0.61–1.24)
GFR, Estimated: >60 mL/min (ref >60)
Glucose, Bld: 102 mg/dL — ABNORMAL HIGH (ref 70–99)
Potassium: 4 mmol/L (ref 3.5–5.1)
Sodium: 132 mmol/L — ABNORMAL LOW (ref 135–145)
Total Bilirubin: 0.8 mg/dL (ref 0.3–1.2)
Total Protein: 6.9 g/dL (ref 6.5–8.1)

## 2022-02-03 LAB — RESP PANEL BY RT-PCR (RSV, FLU A&B, COVID)  RVPGX2
Influenza A by PCR: NEGATIVE
Influenza B by PCR: NEGATIVE
Resp Syncytial Virus by PCR: NEGATIVE
SARS Coronavirus 2 by RT PCR: NEGATIVE

## 2022-02-03 LAB — BRAIN NATRIURETIC PEPTIDE: B Natriuretic Peptide: 94 pg/mL (ref 0.0–100.0)

## 2022-02-03 LAB — TROPONIN I (HIGH SENSITIVITY)
Troponin I (High Sensitivity): 8 ng/L (ref <18)
Troponin I (High Sensitivity): 7 ng/L (ref <18)

## 2022-02-03 MED ORDER — ALBUTEROL SULFATE (2.5 MG/3ML) 0.083% IN NEBU
2.5000 mg | INHALATION_SOLUTION | Freq: Once | RESPIRATORY_TRACT | Status: AC
  Administered 2022-02-03: 2.5 mg via RESPIRATORY_TRACT
  Filled 2022-02-03: qty 3

## 2022-02-03 MED ORDER — IPRATROPIUM-ALBUTEROL 0.5-2.5 (3) MG/3ML IN SOLN
3.0000 mL | Freq: Once | RESPIRATORY_TRACT | Status: AC
  Administered 2022-02-03: 3 mL via RESPIRATORY_TRACT
  Filled 2022-02-03: qty 3

## 2022-02-03 MED ORDER — METHYLPREDNISOLONE SODIUM SUCC 125 MG IJ SOLR
125.0000 mg | Freq: Once | INTRAMUSCULAR | Status: AC
  Administered 2022-02-03: 125 mg via INTRAVENOUS
  Filled 2022-02-03: qty 2

## 2022-02-03 MED ORDER — PREDNISONE 10 MG PO TABS: 40.0000 mg | ORAL_TABLET | Freq: Every day | ORAL | 0 refills | Status: DC

## 2022-02-03 MED ORDER — HYDROCODONE-ACETAMINOPHEN 5-325 MG PO TABS
1.0000 | ORAL_TABLET | Freq: Once | ORAL | Status: AC
  Administered 2022-02-03: 1 via ORAL
  Filled 2022-02-03: qty 1

## 2022-02-03 NOTE — ED Triage Notes (Addendum)
Pt with hx COPD-c/o SOB, dizziness for past few days. Pt with left shoulder since having MRI on Monday at [facility], c/o knot to back of neck.  Pt with cancer to spine, neck and lungs.  Pt currently receiving radiation.

## 2022-02-03 NOTE — ED Provider Notes (Signed)
[facility] Provider Note   CSN: [id number] Arrival date & time: 02/03/22  1121     History  Chief Complaint  Patient presents with   Shortness of Breath    Randall Sawyer is a 67 y.o. male.   Shortness of Breath   67 year old male presents emergency department with complaints of shortness of breath.  Patient states that shortness of breath is worsened over the past 3 to 4 days.  Reports baseline oxygen requirement of 3 L secondary to COPD.  Also reports increasing lower extremity swelling which has been worsening over the same amount of time.  Reports compliance with his at home medications.  Reports some associated chest pain described as chest tightness.  Denies fever, cough, congestion, abdominal pain, nausea, vomiting, urinary symptoms, change in bowel habits.  Past medical history significant for COPD, hypertension, chondrosarcoma of ribs,  Home Medications Prior to Admission medications   Medication Sig Start Date End Date Taking? Authorizing Provider  acetaminophen (TYLENOL) 325 MG tablet Take 2 tablets (650 mg total) by mouth every 6 (six) hours as needed for mild pain (or Fever >/= 101). 01/02/20  Yes Emokpae, Courage, MD  albuterol (PROVENTIL) (2.5 MG/3ML) 0.083% nebulizer solution Take 3 mLs (2.5 mg total) by nebulization every 4 (four) hours as needed for wheezing or shortness of breath. 04/13/21  Yes Tanda Rockers, MD  albuterol (VENTOLIN HFA) 108 (90 Base) MCG/ACT inhaler Inhale 2 puffs into the lungs every 6 (six) hours as needed for wheezing or shortness of breath. 07/30/21  Yes Tanda Rockers, MD  atorvastatin (LIPITOR) 20 MG tablet Take 1 tablet (20 mg total) by mouth daily. 09/20/21  Yes Howard, Amber S, FNP  Budeson-Glycopyrrol-Formoterol (BREZTRI AEROSPHERE) 160-9-4.8 MCG/ACT AERO Inhale 2 puffs into the lungs in the morning and at bedtime. 03/11/21  Yes Tanda Rockers, MD  furosemide (LASIX) 40 MG tablet TAKE 1  TABLET(40 MG) BY MOUTH DAILY 10/05/21  Yes Susy Frizzle, MD  oxyCODONE (OXY IR/ROXICODONE) 5 MG immediate release tablet Take 5 mg by mouth every 6 (six) hours as needed for moderate pain. Pt takes three times daily 01/17/22  Yes [provider]  OXYGEN Inhale 3 L into the lungs daily. And 3 lpm with exertion   Yes [provider]  pantoprazole (PROTONIX) 40 MG tablet TAKE 1 TABLET(40 MG) BY MOUTH IN THE MORNING AND 30 MINUTES BEFORE BREAKFAST 08/30/21  Yes Susy Frizzle, MD  predniSONE (DELTASONE) 10 MG tablet Take 4 tablets (40 mg total) by mouth daily. 02/03/22  Yes Dion Saucier A, PA  gabapentin (NEURONTIN) 300 MG capsule Take 600 mg by mouth 3 (three) times daily. Patient not taking: Reported on 02/03/2022 12/20/21   [provider]  guaiFENesin-dextromethorphan (ROBITUSSIN DM) 100-10 MG/5ML syrup Take 10 mLs by mouth every 4 (four) hours as needed for cough. Patient not taking: Reported on 02/03/2022 01/06/22   Flora Lipps, MD  spironolactone (ALDACTONE) 25 MG tablet Take 1 tablet (25 mg total) by mouth daily. Patient not taking: Reported on 02/03/2022 01/03/20   Roxan Hockey, MD      Allergies    Patient has no known allergies.    Review of Systems   Review of Systems  Respiratory:  Positive for shortness of breath.   All other systems reviewed and are negative.   Physical Exam Updated Vital Signs BP (!) 156/79 (BP Location: Left Arm)   Pulse 97   Temp 98.2 F (36.8 C) (  Oral)   Resp 19   Ht 5\' 8"  (1.727 m)   Wt 109.8 kg   SpO2 95%   BMI 36.80 kg/m  Physical Exam Vitals and nursing note reviewed.  Constitutional:      General: He is not in acute distress.    Appearance: He is well-developed.  HENT:     Head: Normocephalic and atraumatic.  Eyes:     Conjunctiva/sclera: Conjunctivae normal.  Cardiovascular:     Rate and Rhythm: Normal rate and regular rhythm.     Heart sounds: No murmur heard. Pulmonary:     Effort: Pulmonary  effort is normal. No respiratory distress.     Breath sounds: No stridor. Wheezing and rhonchi present.     Comments: Diffuse wheeze/rhonchi auscultated on respiratory exam bilaterally. Abdominal:     Palpations: Abdomen is soft.     Tenderness: There is no abdominal tenderness.  Musculoskeletal:        General: No swelling.     Cervical back: Neck supple.     Right lower leg: Edema present.     Left lower leg: Edema present.  Skin:    General: Skin is warm and dry.     Capillary Refill: Capillary refill takes less than 2 seconds.  Neurological:     Mental Status: He is alert.  Psychiatric:        Mood and Affect: Mood normal.     ED Results / Procedures / Treatments   Labs (all labs ordered are listed, but only abnormal results are displayed) Labs Reviewed  COMPREHENSIVE METABOLIC PANEL - Abnormal; Notable for the following components:      Result Value   Sodium 132 (*)    Chloride 84 (*)    CO2 40 (*)    Glucose, Bld 102 (*)    Calcium 8.2 (*)    All other components within normal limits  CBC WITH DIFFERENTIAL/PLATELET - Abnormal; Notable for the following components:   RBC 3.44 (*)    Hemoglobin 9.5 (*)    HCT 31.3 (*)    RDW 16.8 (*)    Lymphs Abs 0.3 (*)    All other components within normal limits  RESP PANEL BY RT-PCR (RSV, FLU A&B, COVID)  [id number]  BRAIN NATRIURETIC PEPTIDE  TROPONIN I (HIGH SENSITIVITY)  TROPONIN I (HIGH SENSITIVITY)    EKG EKG Interpretation  Date/Time:  Thursday February 03 2022 11:34:19 EST Ventricular Rate:  88 PR Interval:  162 QRS Duration: 86 QT Interval:  358 QTC Calculation: 433 R Axis:   57 Text Interpretation: Normal sinus rhythm Normal ECG When compared with ECG of 04-Jan-2022 11:26, Nonspecific T wave abnormality now evident in Lateral leads Confirmed by Noemi Chapel [phone]) on 02/03/2022 12:23:51 PM  Radiology DG Shoulder Left  Result Date: 02/03/2022 CLINICAL DATA:  Chronic shoulder pain EXAM: LEFT SHOULDER - 2+ VIEW  COMPARISON:  None Available. FINDINGS: Mild AC joint degenerative change. No fracture or malalignment. Possible mild narrowing of sub acromial space. IMPRESSION: Mild AC joint degenerative change. Possible mild narrowing of sub acromial space as may be seen with rotator cuff disease Electronically Signed   By: Donavan Foil M.D.   On: 02/03/2022 15:08   DG Chest 2 View  Result Date: 02/03/2022 CLINICAL DATA:  COPD, shortness of breath, dizziness for few days, LEFT shoulder pain, knot at back of neck, history of metastatic cancer EXAM: CHEST - 2 VIEW COMPARISON:  01/04/2022 FINDINGS: Upper normal heart size. Slight pulmonary vascular congestion. Mediastinal contours normal  with atherosclerotic calcification aorta. Bibasilar atelectasis. Nodular density RIGHT upper lobe 21 x 18 mm. Remaining lungs clear without acute infiltrate, pleural effusion, or pneumothorax. IMPRESSION: Bibasilar atelectasis. Nodular density upper RIGHT lung, could represent a pulmonary or osseous metastasis, consider CT imaging if this has not been previously assessed. Electronically Signed   By: Lavonia Dana M.D.   On: 02/03/2022 12:03    Procedures Procedures    Medications Ordered in ED Medications  ipratropium-albuterol (DUONEB) 0.5-2.5 (3) MG/3ML nebulizer solution 3 mL (3 mLs Nebulization Given 02/03/22 1410)  methylPREDNISolone sodium succinate (SOLU-MEDROL) 125 mg/2 mL injection 125 mg (125 mg Intravenous Given 02/03/22 1239)  albuterol (PROVENTIL) (2.5 MG/3ML) 0.083% nebulizer solution 2.5 mg (2.5 mg Nebulization Given 02/03/22 1410)  HYDROcodone-acetaminophen (NORCO/VICODIN) 5-325 MG per tablet 1 tablet (1 tablet Oral Given 02/03/22 1441)    ED Course/ Medical Decision Making/ A&P Clinical Course as of 02/03/22 1836  Thu Feb 03, 2022  1436 Upon reassessment, patient states that his breathing has significantly improved.  Currently complaining of left shoulder pain and is requesting imaging of left shoulder.  No trauma to  affected shoulder. [CR]  1513 Discussed with patient regarding chest x-ray findings of new right upper lobe mass of which she is aware of from prior CT performed this past Monday. [CR]    Clinical Course User Index [CR] Wilnette Kales, [place]             HEART Score: 4                Medical Decision Making Amount and/or Complexity of Data Reviewed Labs: ordered. Radiology: ordered.  Risk Prescription drug management.   This patient presents to the ED for concern of shortness of breath, this involves an extensive number of treatment options, and is a complaint that carries with it a high risk of complications and morbidity.  The differential diagnosis includes The causes for shortness of breath include but are not limited to Cardiac (AHF, pericardial effusion and tamponade, arrhythmias, ischemia, etc) Respiratory (COPD, asthma, pneumonia, pneumothorax, primary pulmonary hypertension, PE/VQ mismatch) Hematological (anemia)  Co morbidities that complicate the patient evaluation  See HPI   Additional history obtained:  Additional history obtained from EMR External records from outside source obtained and reviewed including hospital records   Lab Tests:  I Ordered, and personally interpreted labs.  The pertinent results include: No leukocytosis.  Evidence of anemia with hemoglobin 9.5 which seems to be stable from prior laboratory studies performed.  No evidence of platelet abnormalities.  Mild hyponatremia with sodium 132.  Decrease in chloride of 84 as well as increased bicarb of 40 of which seem to be chronic in nature.  No transaminitis.  No renal dysfunction.  BNP within normal limits.  Respiratory viral panel negative.  Initial troponin of 8 with repeat 7; EKG sinus rhythm with nonspecific T wave changes in lateral leads.   Imaging Studies ordered:  I ordered imaging studies including chest x-ray, left shoulder x-ray I independently visualized and interpreted imaging which  showed  Chest x-ray: Bibasilar atelectasis.  Nodular density of her right lung Left shoulder x-ray: Mild AC joint degenerative changes.  Mild narrowing of subacromial space I agree with the radiologist interpretation  Cardiac Monitoring: / EKG:  The patient was maintained on a cardiac monitor.  I personally viewed and interpreted the cardiac monitored which showed an underlying rhythm of: Sinus rhythm with nonspecific T wave changes in lateral lead   Consultations Obtained:  I requested consultation  with attending physician Dr. Sabra Heck who is in agreement with treatment plan going forward  Problem List / ED Course / Critical interventions / Medication management  COPD exacerbation I ordered medication including DuoNeb, albuterol, Solu-Medrol for COPD exacerbation.  Norco for pain Reevaluation of the patient after these medicines showed that the patient improved I have reviewed the patients home medicines and have made adjustments as needed   Social Determinants of Health:  Former cigarette use.  Denies illicit drug use.   Test / Admission - Considered:  COPD exacerbation Vitals signs significant for hypertension with blood pressure 136/79.  Recommend follow-up with primary care for reassessment. Otherwise within normal range and stable throughout visit. Laboratory/imaging studies significant for: See above Patient with evidence of shortness of breath.  Patient's workup overall reassuring.  Doubt ACS given delta negative troponin as well as chronicity of patient's symptoms.  Patient technically heart score 4 given age but symptoms more closely aligned with COPD exacerbation.  Doubt PE, dissection, pneumonia, acute CHF.  Patient responded well to steroids as well as breathing treatments while emergency department.  Recommend continued therapy as outpatient with same.  Recommend follow-up with primary care for reassessment of symptoms.  Treatment plan discussed at length with patient and  he acknowledged understanding was agreeable to said plan. Treatment plan were discussed at length with patient and they knowledge understanding was agreeable to said plan.  Appropriate consultations were made as described in the ED course.  Patient was stable upon admission to the hospital.         Final Clinical Impression(s) / ED Diagnoses Final diagnoses:  COPD exacerbation ([place])  Dyspnea, unspecified type    Rx / DC Orders ED Discharge Orders          Ordered    predniSONE (DELTASONE) 10 MG tablet  Daily        02/03/22 1512              Wilnette Kales, [place] 02/03/22 1836    Noemi Chapel, MD 02/04/22 [phone]

## 2022-02-03 NOTE — Discharge Instructions (Addendum)
As discussed, symptoms most likely secondary to COPD exacerbation.  Recommend continued therapy at home with oral steroids as well as using inhalers as prescribed.  Recommend follow-up with primary care for reassessment of your breathing.  Left shoulder x-ray was negative for acute fracture or dislocation but some concern for rotator cuff pathology.  Attached is number for orthopedics to follow-up with regarding her left shoulder pain.  Please do not hesitate to return to emergency department for worrisome signs and symptoms we discussed become apparent.

## 2022-02-09 DIAGNOSIS — C7949 Secondary malignant neoplasm of other parts of nervous system: Secondary | ICD-10-CM | POA: Diagnosis not present

## 2022-02-09 DIAGNOSIS — C3411 Malignant neoplasm of upper lobe, right bronchus or lung: Secondary | ICD-10-CM | POA: Diagnosis not present

## 2022-02-09 DIAGNOSIS — C3431 Malignant neoplasm of lower lobe, right bronchus or lung: Secondary | ICD-10-CM | POA: Diagnosis not present

## 2022-02-09 DIAGNOSIS — C419 Malignant neoplasm of bone and articular cartilage, unspecified: Secondary | ICD-10-CM | POA: Diagnosis not present

## 2022-02-09 DIAGNOSIS — Z85830 Personal history of malignant neoplasm of bone: Secondary | ICD-10-CM | POA: Diagnosis not present

## 2022-02-09 DIAGNOSIS — C7932 Secondary malignant neoplasm of cerebral meninges: Secondary | ICD-10-CM | POA: Diagnosis not present

## 2022-02-09 DIAGNOSIS — Z9889 Other specified postprocedural states: Secondary | ICD-10-CM | POA: Diagnosis not present

## 2022-02-09 DIAGNOSIS — Z510 Encounter for antineoplastic radiation therapy: Secondary | ICD-10-CM | POA: Diagnosis not present

## 2022-02-09 DIAGNOSIS — Z923 Personal history of irradiation: Secondary | ICD-10-CM | POA: Diagnosis not present

## 2022-02-09 DIAGNOSIS — C3432 Malignant neoplasm of lower lobe, left bronchus or lung: Secondary | ICD-10-CM | POA: Diagnosis not present

## 2022-02-09 DIAGNOSIS — R918 Other nonspecific abnormal finding of lung field: Secondary | ICD-10-CM | POA: Diagnosis not present

## 2022-02-09 DIAGNOSIS — C7951 Secondary malignant neoplasm of bone: Secondary | ICD-10-CM | POA: Diagnosis not present

## 2022-02-18 DIAGNOSIS — J449 Chronic obstructive pulmonary disease, unspecified: Secondary | ICD-10-CM | POA: Diagnosis not present

## 2022-02-22 ENCOUNTER — Telehealth: Payer: Self-pay

## 2022-02-22 NOTE — Telephone Encounter (Signed)
Amber, RN with [facility] called asking if you would be the patient's attending? Thank you.  Call back # [phone].

## 2022-03-02 ENCOUNTER — Telehealth: Payer: Self-pay

## 2022-03-02 DIAGNOSIS — J449 Chronic obstructive pulmonary disease, unspecified: Secondary | ICD-10-CM | POA: Diagnosis not present

## 2022-03-02 NOTE — Telephone Encounter (Signed)
Maudie Mercury, RN with [facility] has called with 3 issues with Mr. Harriger. Pt c/o increasing pain and Maudie Mercury ask if patient can have a different pain medication or can the Oxycodone be increased? Pt is having increasing edema in legs and feet. Pt is has Lasix 40 mg ordered but pt's daughter has been giving the patient 1.5 tablets instead of 1.  Maudie Mercury asks if an Rx for Ipratropium nebulizer can be called in to go along with the patient's Albuterol?  Thank you.

## 2022-03-03 ENCOUNTER — Other Ambulatory Visit: Payer: Self-pay | Admitting: Family Medicine

## 2022-03-03 ENCOUNTER — Other Ambulatory Visit: Payer: Self-pay

## 2022-03-03 DIAGNOSIS — E8770 Fluid overload, unspecified: Secondary | ICD-10-CM

## 2022-03-03 DIAGNOSIS — I509 Heart failure, unspecified: Secondary | ICD-10-CM

## 2022-03-03 DIAGNOSIS — G4733 Obstructive sleep apnea (adult) (pediatric): Secondary | ICD-10-CM | POA: Diagnosis not present

## 2022-03-03 MED ORDER — IPRATROPIUM BROMIDE 0.02 % IN SOLN: 0.5000 mg | Freq: Four times a day (QID) | RESPIRATORY_TRACT | 12 refills | Status: DC

## 2022-03-03 MED ORDER — FUROSEMIDE 40 MG PO TABS: 40.0000 mg | ORAL_TABLET | Freq: Two times a day (BID) | ORAL | 1 refills | Status: DC

## 2022-03-03 MED ORDER — OXYCODONE HCL 5 MG PO TABS: 10.0000 mg | ORAL_TABLET | Freq: Four times a day (QID) | ORAL | 0 refills | Status: DC | PRN

## 2022-03-10 ENCOUNTER — Telehealth: Payer: Self-pay

## 2022-03-11 NOTE — Patient Instructions (Incomplete)
Randall Sawyer , Thank you for taking time to come for your Medicare Wellness Visit. I appreciate your ongoing commitment to your health goals. Please review the following plan we discussed and let me know if I can assist you in the future.   These are the goals we discussed:  Goals      Exercise 3x per week (30 min per time)     Exercise as tolerated.     Track and Manage My Symptoms-COPD     Timeframe:  Long-Range Goal Priority:  High Start Date:    03/11/21                         Expected End Date:   09/11/21                    Follow Up Date 06/11/21    - develop a rescue plan - eliminate symptom triggers at home - follow rescue plan if symptoms flare-up - keep follow-up appointments    Why is this important?   Tracking your symptoms and other information about your health helps your doctor plan your care.  Write down the symptoms, the time of day, what you were doing and what medicine you are taking.  You will soon learn how to manage your symptoms.     Notes:         This is a list of the screening recommended for you and due dates:  Health Maintenance  Topic Date Due   Hepatitis C Screening: USPSTF Recommendation to screen - Ages 73-79 yo.  Never done   DTaP/Tdap/Td vaccine (1 - Tdap) Never done   Zoster (Shingles) Vaccine (1 of 2) Never done   Pneumonia Vaccine (2 of 2 - PCV) 11/03/2015   COVID-19 Vaccine (3 - Moderna risk series) 11/01/2019   Medicare Annual Wellness Visit  03/04/2022   Colon Cancer Screening  2022/03/16*   Flu Shot  04/10/2022*   HPV Vaccine  Aged Out  *Topic was postponed. The date shown is not the original due date.    Advanced directives: ***  Conditions/risks identified: Aim for 30 minutes of exercise or brisk walking, 6-8 glasses of water, and 5 servings of fruits and vegetables each day.   Next appointment: Follow up in one year for your annual wellness visit.   Preventive Care 17 Years and Older, Male  Preventive care refers to  lifestyle choices and visits with your health care provider that can promote health and wellness. What does preventive care include? A yearly physical exam. This is also called an annual well check. Dental exams once or twice a year. Routine eye exams. Ask your health care provider how often you should have your eyes checked. Personal lifestyle choices, including: Daily care of your teeth and gums. Regular physical activity. Eating a healthy diet. Avoiding tobacco and drug use. Limiting alcohol use. Practicing safe sex. Taking low doses of aspirin every day. Taking vitamin and mineral supplements as recommended by your health care provider. What happens during an annual well check? The services and screenings done by your health care provider during your annual well check will depend on your age, overall health, lifestyle risk factors, and family history of disease. Counseling  Your health care provider may ask you questions about your: Alcohol use. Tobacco use. Drug use. Emotional well-being. Home and relationship well-being. Sexual activity. Eating habits. History of falls. Memory and ability to understand (cognition). Work and work Statistician.  Screening  You may have the following tests or measurements: Height, weight, and BMI. Blood pressure. Lipid and cholesterol levels. These may be checked every 5 years, or more frequently if you are over 80 years old. Skin check. Lung cancer screening. You may have this screening every year starting at age 7 if you have a 30-pack-year history of smoking and currently smoke or have quit within the past 15 years. Fecal occult blood test (FOBT) of the stool. You may have this test every year starting at age 80. Flexible sigmoidoscopy or colonoscopy. You may have a sigmoidoscopy every 5 years or a colonoscopy every 10 years starting at age 67. Prostate cancer screening. Recommendations will vary depending on your family history and other  risks. Hepatitis C blood test. Hepatitis B blood test. Sexually transmitted disease (STD) testing. Diabetes screening. This is done by checking your blood sugar (glucose) after you have not eaten for a while (fasting). You may have this done every 1-3 years. Abdominal aortic aneurysm (AAA) screening. You may need this if you are a current or former smoker. Osteoporosis. You may be screened starting at age 8 if you are at high risk. Talk with your health care provider about your test results, treatment options, and if necessary, the need for more tests. Vaccines  Your health care provider may recommend certain vaccines, such as: Influenza vaccine. This is recommended every year. Tetanus, diphtheria, and acellular pertussis (Tdap, Td) vaccine. You may need a Td booster every 10 years. Zoster vaccine. You may need this after age 11. Pneumococcal 13-valent conjugate (PCV13) vaccine. One dose is recommended after age 35. Pneumococcal polysaccharide (PPSV23) vaccine. One dose is recommended after age 67. Talk to your health care provider about which screenings and vaccines you need and how often you need them. This information is not intended to replace advice given to you by your health care provider. Make sure you discuss any questions you have with your health care provider. Document Released: 01/23/2015 Document Revised: 09/16/2015 Document Reviewed: 10/28/2014 Elsevier Interactive Patient Education  2017 [employer] Prevention in the Home Falls can cause injuries. They can happen to people of all ages. There are many things you can do to make your home safe and to help prevent falls. What can I do on the outside of my home? Regularly fix the edges of walkways and driveways and fix any cracks. Remove anything that might make you trip as you walk through a door, such as a raised step or threshold. Trim any bushes or trees on the path to your home. Use bright outdoor lighting. Clear  any walking paths of anything that might make someone trip, such as rocks or tools. Regularly check to see if handrails are loose or broken. Make sure that both sides of any steps have handrails. Any raised decks and porches should have guardrails on the edges. Have any leaves, snow, or ice cleared regularly. Use sand or salt on walking paths during winter. Clean up any spills in your garage right away. This includes oil or grease spills. What can I do in the bathroom? Use night lights. Install grab bars by the toilet and in the tub and shower. Do not use towel bars as grab bars. Use non-skid mats or decals in the tub or shower. If you need to sit down in the shower, use a plastic, non-slip stool. Keep the floor dry. Clean up any water that spills on the floor as soon as it happens. Remove soap  buildup in the tub or shower regularly. Attach bath mats securely with double-sided non-slip rug tape. Do not have throw rugs and other things on the floor that can make you trip. What can I do in the bedroom? Use night lights. Make sure that you have a light by your bed that is easy to reach. Do not use any sheets or blankets that are too big for your bed. They should not hang down onto the floor. Have a firm chair that has side arms. You can use this for support while you get dressed. Do not have throw rugs and other things on the floor that can make you trip. What can I do in the kitchen? Clean up any spills right away. Avoid walking on wet floors. Keep items that you use a lot in easy-to-reach places. If you need to reach something above you, use a strong step stool that has a grab bar. Keep electrical cords out of the way. Do not use floor polish or wax that makes floors slippery. If you must use wax, use non-skid floor wax. Do not have throw rugs and other things on the floor that can make you trip. What can I do with my stairs? Do not leave any items on the stairs. Make sure that there  are handrails on both sides of the stairs and use them. Fix handrails that are broken or loose. Make sure that handrails are as long as the stairways. Check any carpeting to make sure that it is firmly attached to the stairs. Fix any carpet that is loose or worn. Avoid having throw rugs at the top or bottom of the stairs. If you do have throw rugs, attach them to the floor with carpet tape. Make sure that you have a light switch at the top of the stairs and the bottom of the stairs. If you do not have them, ask someone to add them for you. What else can I do to help prevent falls? Wear shoes that: Do not have high heels. Have rubber bottoms. Are comfortable and fit you well. Are closed at the toe. Do not wear sandals. If you use a stepladder: Make sure that it is fully opened. Do not climb a closed stepladder. Make sure that both sides of the stepladder are locked into place. Ask someone to hold it for you, if possible. Clearly mark and make sure that you can see: Any grab bars or handrails. First and last steps. Where the edge of each step is. Use tools that help you move around (mobility aids) if they are needed. These include: Canes. Walkers. Scooters. Crutches. Turn on the lights when you go into a dark area. Replace any light bulbs as soon as they burn out. Set up your furniture so you have a clear path. Avoid moving your furniture around. If any of your floors are uneven, fix them. If there are any pets around you, be aware of where they are. Review your medicines with your doctor. Some medicines can make you feel dizzy. This can increase your chance of falling. Ask your doctor what other things that you can do to help prevent falls. This information is not intended to replace advice given to you by your health care provider. Make sure you discuss any questions you have with your health care provider. Document Released: 10/23/2008 Document Revised: 06/04/2015 Document Reviewed:  01/31/2014 Elsevier Interactive Patient Education  2017 [employer].

## 2022-03-11 NOTE — Progress Notes (Deleted)
Subjective:   Randall Sawyer is a 66 y.o. male who presents for Medicare Annual/Subsequent preventive examination.  Review of Systems    ***       Objective:    There were no vitals filed for this visit. There is no height or weight on file to calculate BMI.     01/05/2022   12:13 AM 01/04/2022   11:23 AM 03/04/2021   11:58 AM 12/29/2019    8:05 PM 12/29/2019   12:47 PM 04/11/2017    9:26 AM 04/03/2017   10:16 AM  Advanced Directives  Does Patient Have a Medical Advance Directive? Yes No Yes No No No No  Type of Paramedic of [place];Living will      Does patient want to make changes to medical advance directive? No - Patient declined        Copy of [place] in Chart? No - copy requested  Yes - validated most recent copy scanned in chart (See row information)      Would patient like information on creating a medical advance directive? No - Patient declined Yes (ED - Information included in AVS) No - Patient declined No - Patient declined No - Patient declined No - Patient declined No - Patient declined    Current Medications (verified) Outpatient Encounter Medications as of 25-Mar-2022  Medication Sig   acetaminophen (TYLENOL) 325 MG tablet Take 2 tablets (650 mg total) by mouth every 6 (six) hours as needed for mild pain (or Fever >/= 101).   albuterol (PROVENTIL) (2.5 MG/3ML) 0.083% nebulizer solution Take 3 mLs (2.5 mg total) by nebulization every 4 (four) hours as needed for wheezing or shortness of breath.   albuterol (VENTOLIN HFA) 108 (90 Base) MCG/ACT inhaler Inhale 2 puffs into the lungs every 6 (six) hours as needed for wheezing or shortness of breath.   atorvastatin (LIPITOR) 20 MG tablet Take 1 tablet (20 mg total) by mouth daily.   Budeson-Glycopyrrol-Formoterol (BREZTRI AEROSPHERE) 160-9-4.8 MCG/ACT AERO Inhale 2 puffs into the lungs in the morning and at bedtime.   furosemide (LASIX) 40 MG  tablet Take 1 tablet (40 mg total) by mouth 2 (two) times daily. Take 1 tablet (40 mg total) by mouth 2 (two) times daily. Pt can take 1.5 tablets (60 mg) total as needed for increased swelling.   gabapentin (NEURONTIN) 300 MG capsule Take 600 mg by mouth 3 (three) times daily. (Patient not taking: Reported on 02/03/2022)   guaiFENesin-dextromethorphan (ROBITUSSIN DM) 100-10 MG/5ML syrup Take 10 mLs by mouth every 4 (four) hours as needed for cough. (Patient not taking: Reported on 02/03/2022)   ipratropium (ATROVENT) 0.02 % nebulizer solution Take 2.5 mLs (0.5 mg total) by nebulization 4 (four) times daily.   oxyCODONE (OXY IR/ROXICODONE) 5 MG immediate release tablet Take 2 tablets (10 mg total) by mouth every 6 (six) hours as needed for moderate pain. Pt takes three times daily   OXYGEN Inhale 3 L into the lungs daily. And 3 lpm with exertion   pantoprazole (PROTONIX) 40 MG tablet TAKE 1 TABLET(40 MG) BY MOUTH IN THE MORNING AND 30 MINUTES BEFORE BREAKFAST   predniSONE (DELTASONE) 10 MG tablet Take 4 tablets (40 mg total) by mouth daily.   spironolactone (ALDACTONE) 25 MG tablet Take 1 tablet (25 mg total) by mouth daily. (Patient not taking: Reported on 02/03/2022)   No facility-administered encounter medications on file as of Mar 25, 2022.    Allergies (verified) Patient  has no known allergies.   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, 4.5 cm, grade 2   COPD (chronic obstructive pulmonary disease) (HCC)    Hypertension    Nephrolithiasis    Tobacco abuse    Past Surgical History:  Procedure Laterality Date   BIOPSY N/A 02/26/2015   Procedure: BIOPSY;  Surgeon: Rogene Houston, MD;  [facility];  Service: Endoscopy;  Laterality: N/A;  Gastric biopsies   ESOPHAGOGASTRODUODENOSCOPY N/A 11/19/2014   Procedure: ESOPHAGOGASTRODUODENOSCOPY (EGD);  Surgeon: Rogene Houston, MD;  [facility];  Service:  Endoscopy;  Laterality: N/A;  2:15-rescheduled to 11/9 '@1'$ :57 Ann notified pt   ESOPHAGOGASTRODUODENOSCOPY N/A 02/26/2015   Procedure: ESOPHAGOGASTRODUODENOSCOPY (EGD);  Surgeon: Rogene Houston, MD;  [facility]: [facility];  Service: Endoscopy;  Laterality: N/A;  42   Family History  Problem Relation Age of Onset   COPD Father    Heart disease Father    Asthma Father    Diabetes type II Mother    Diabetes Sister    Social History   Socioeconomic History   Marital status: Divorced    Spouse name: Not on file   Number of children: Not on file   Years of education: Not on file   Highest education level: Not on file  Occupational History   Occupation: Clinical biochemist   Tobacco Use   Smoking status: Former    Packs/day: 0.25    Years: 51.00    Total pack years: 12.75    Types: Cigarettes    Start date: 12/30/1964    Quit date: 05/21/2017    Years since quitting: 4.8   Smokeless tobacco: Never  Vaping Use   Vaping Use: Never used  Substance and Sexual Activity   Alcohol use: No    Alcohol/week: 0.0 standard drinks of alcohol   Drug use: No   Sexual activity: Yes    Partners: Female  Other Topics Concern   Not on file  Social History Narrative   Not on file   Social Determinants of Health   Financial Resource Strain: Low Risk  (03/04/2021)   Overall Financial Resource Strain (CARDIA)    Difficulty of Paying Living Expenses: Not hard at all  Food Insecurity: Unknown (01/05/2022)   Hunger Vital Sign    Worried About [place] in the Last Year: Patient refused    [place] in the Last Year: Patient refused  Transportation Needs: Unknown (01/05/2022)   [place] - Transportation    Lack of Transportation (Medical): Patient refused    Lack of Transportation (Non-Medical): Patient refused  Physical Activity: Insufficiently Active (03/04/2021)   Exercise Vital Sign    Days of Exercise per Week: 5 days    Minutes of Exercise per Session: 20 min  Stress: No  Stress Concern Present (03/04/2021)   [place] of Stress : Not at all  Social Connections: Moderately Integrated (03/04/2021)   Social Connection and Isolation Panel [NHANES]    Frequency of Communication with Friends and Family: More than three times a week    Frequency of Social Gatherings with Friends and Family: More than three times a week    Attends Religious Services: 1 to 4 times per year    Active Member of [employer] or Organizations: No    Attends Archivist  Meetings: 1 to 4 times per year    Marital Status: Divorced    Tobacco Counseling Counseling given: Not Answered   Clinical Intake:                 Diabetic?No          Activities of Daily Living    01/05/2022   12:20 AM 01/05/2022   12:18 AM  In your present state of health, do you have any difficulty performing the following activities:  Hearing?  0  Vision?  0  Difficulty concentrating or making decisions?  0  Walking or climbing stairs?  1  Dressing or bathing?  1  Comment  Sometimes per pt  Doing errands, shopping? [place] member takes patient     Patient Care Team: Susy Frizzle, MD as PCP - General (Family Medicine) Satira Sark, MD as Consulting Physician (Cardiology) Edythe Clarity, [facility] as Pharmacist (Pharmacist)  Indicate any recent Medical Services you may have received from other than [facility] providers in the past year (date may be approximate).     Assessment:   This is a routine wellness examination for [place].  Hearing/Vision screen No results found.  Dietary issues and exercise activities discussed:     Goals Addressed   None    Depression Screen    09/17/2021   10:44 AM 03/04/2021   11:56 AM 07/31/2020    3:14 PM 11/22/2016    2:17 PM  PHQ 2/9 Scores  PHQ - 2 Score 0 0 0 0  PHQ- 9 Score 0       Fall Risk    09/17/2021   10:40 AM 03/04/2021   11:59 AM  07/31/2020    3:14 PM  [place] in the past year? 0 0 0  Number falls in past yr: 0 0 0  Injury with Fall? 0 0 0  Risk for fall due to :  No Fall Risks;Other (Comment) No Fall Risks  Follow up  Falls prevention discussed Falls evaluation completed    FALL RISK PREVENTION PERTAINING TO THE HOME:  Any stairs in or around the home? {YES/NO:21197} If so, are there any without handrails? {YES/NO:21197} Home free of loose throw rugs in walkways, pet beds, electrical cords, etc? {YES/NO:21197} Adequate lighting in your home to reduce risk of falls? {YES/NO:21197}  ASSISTIVE DEVICES UTILIZED TO PREVENT FALLS:  Life alert? {YES/NO:21197} Use of a cane, walker or w/c? {YES/NO:21197} Grab bars in the bathroom? {YES/NO:21197} Shower chair or bench in shower? {YES/NO:21197} Elevated toilet seat or a handicapped toilet? {YES/NO:21197}  TIMED UP AND GO:  Was the test performed? No . Telephonic visit   Cognitive Function:        03/04/2021   12:01 PM  6CIT Screen  What Year? 0 points  What month? 0 points  What time? 0 points  Count back from 20 0 points  Months in reverse 0 points  Repeat phrase 0 points  Total Score 0 points    Immunizations Immunization History  Administered Date(s) Administered   Influenza Split 11/11/2014, 10/11/2015   Moderna Sars-Covid-2 Vaccination 09/06/2019, 10/04/2019   Pneumococcal Polysaccharide-23 11/03/2014    {TDAP status:2101805}  {Flu Vaccine status:2101806}  {Pneumococcal vaccine status:2101807}  {Covid-19 vaccine status:2101808}  Qualifies for Shingles Vaccine? {YES/NO:21197}  Zostavax completed {YES/NO:21197}  {Shingrix Completed?:2101804}  Screening Tests Health Maintenance  Topic Date Due   Hepatitis C Screening  Never done   DTaP/Tdap/Td (1 - Tdap) Never done  Zoster Vaccines- Shingrix (1 of 2) Never done   Pneumonia Vaccine 65+ Years old (2 of 2 - PCV) 11/03/2015   COVID-19 Vaccine (3 - Moderna risk series)  11/01/2019   Medicare Annual Wellness (AWV)  03/04/2022   COLONOSCOPY (Pts 45-66yr Insurance coverage will need to be confirmed)  003-11-2022(Originally 12/22/1998)   INFLUENZA VACCINE  04/10/2022 (Originally 08/10/2021)   HPV VACCINES  Aged Out    Health Maintenance  Health Maintenance Due  Topic Date Due   Hepatitis C Screening  Never done   DTaP/Tdap/Td (1 - Tdap) Never done   Zoster Vaccines- Shingrix (1 of 2) Never done   Pneumonia Vaccine 69 Years old (2 of 2 - PCV) 11/03/2015   COVID-19 Vaccine (3 - Moderna risk series) 11/01/2019   Medicare Annual Wellness (AWV)  03/04/2022    Colorectal cancer status: Patient declines at this time   Lung Cancer Screening: (Low Dose CT Chest recommended if Age 69-80years, 30 pack-year currently smoking OR have quit w/in 15years.) does not qualify.   Lung Cancer Screening Referral: currently receiving treatment for lung cx   Additional Screening:  Hepatitis C Screening: does qualify  Vision Screening: Recommended annual ophthalmology exams for early detection of glaucoma and other disorders of the eye. Is the patient up to date with their annual eye exam?  {YES/NO:21197} Who is the provider or what is the name of the office in which the patient attends annual eye exams? *** If pt is not established with a provider, would they like to be referred to a provider to establish care? {YES/NO:21197}.   Dental Screening: Recommended annual dental exams for proper oral hygiene  Community Resource Referral / Chronic Care Management: CRR required this visit?  {YES/NO:21197}  CCM required this visit?  {YES/NO:21197}     Plan:     I have personally reviewed and noted the following in the patient's chart:   Medical and social history Use of alcohol, tobacco or illicit drugs  Current medications and supplements including opioid prescriptions. {Opioid Prescriptions:[phone]} Functional ability and status Nutritional status Physical  activity Advanced directives List of other physicians Hospitalizations, surgeries, and ER visits in previous 12 months Vitals Screenings to include cognitive, depression, and falls Referrals and appointments  In addition, I have reviewed and discussed with patient certain preventive protocols, quality metrics, and best practice recommendations. A written personalized care plan for preventive services as well as general preventive health recommendations were provided to patient.     SVanetta Mulders [place]  [id number]  Due to this being a virtual visit, the after visit summary with patients personalized plan was offered to patient via mail or my-chart. ***Patient declined at this time./ Patient would like to access on my-chart/ per request, patient was mailed a copy of AVS./ Patient preferred to pick up at office at next visit  Nurse Notes: ***

## 2022-03-11 NOTE — Telephone Encounter (Signed)
Cher Nakai, RN with Hospice called to advise that Mr. Morino passed away this morning at home. Colletta [place] said there were no complications prior to passing. Thank you.

## 2022-03-11 DEATH — deceased

## 2022-04-07 ENCOUNTER — Encounter: Payer: Medicare Other | Admitting: Pharmacist

## 2022-05-18 ENCOUNTER — Ambulatory Visit: Payer: Medicare Other | Admitting: Dermatology
